# Patient Record
Sex: Male | Born: 1935 | Race: Black or African American | Hispanic: No | Smoking: Former smoker
Health system: Southern US, Community
[De-identification: ages and names within clinical notes are randomized; demographics above are authoritative.]

## PROBLEM LIST (undated history)

## (undated) DIAGNOSIS — I739 Peripheral vascular disease, unspecified: Secondary | ICD-10-CM

## (undated) DIAGNOSIS — I779 Disorder of arteries and arterioles, unspecified: Secondary | ICD-10-CM

## (undated) DIAGNOSIS — E782 Mixed hyperlipidemia: Secondary | ICD-10-CM

## (undated) DIAGNOSIS — E538 Deficiency of other specified B group vitamins: Secondary | ICD-10-CM

## (undated) DIAGNOSIS — D649 Anemia, unspecified: Secondary | ICD-10-CM

## (undated) DIAGNOSIS — K851 Biliary acute pancreatitis without necrosis or infection: Secondary | ICD-10-CM

## (undated) DIAGNOSIS — I4891 Unspecified atrial fibrillation: Secondary | ICD-10-CM

## (undated) DIAGNOSIS — I2510 Atherosclerotic heart disease of native coronary artery without angina pectoris: Secondary | ICD-10-CM

## (undated) DIAGNOSIS — M179 Osteoarthritis of knee, unspecified: Secondary | ICD-10-CM

## (undated) DIAGNOSIS — F0390 Unspecified dementia without behavioral disturbance: Secondary | ICD-10-CM

## (undated) DIAGNOSIS — M1710 Unilateral primary osteoarthritis, unspecified knee: Secondary | ICD-10-CM

## (undated) DIAGNOSIS — E119 Type 2 diabetes mellitus without complications: Secondary | ICD-10-CM

## (undated) DIAGNOSIS — I639 Cerebral infarction, unspecified: Secondary | ICD-10-CM

## (undated) DIAGNOSIS — I10 Essential (primary) hypertension: Secondary | ICD-10-CM

## (undated) DIAGNOSIS — G459 Transient cerebral ischemic attack, unspecified: Secondary | ICD-10-CM

## (undated) HISTORY — DX: Essential (primary) hypertension: I10

## (undated) HISTORY — DX: Atherosclerotic heart disease of native coronary artery without angina pectoris: I25.10

## (undated) HISTORY — DX: Unspecified atrial fibrillation: I48.91

## (undated) HISTORY — DX: Biliary acute pancreatitis without necrosis or infection: K85.10

## (undated) HISTORY — PX: HEMORRHOID SURGERY: SHX153

## (undated) HISTORY — DX: Mixed hyperlipidemia: E78.2

## (undated) HISTORY — DX: Anemia, unspecified: D64.9

## (undated) HISTORY — DX: Type 2 diabetes mellitus without complications: E11.9

## (undated) HISTORY — DX: Unspecified dementia, unspecified severity, without behavioral disturbance, psychotic disturbance, mood disturbance, and anxiety: F03.90

## (undated) HISTORY — DX: Peripheral vascular disease, unspecified: I73.9

## (undated) HISTORY — DX: Transient cerebral ischemic attack, unspecified: G45.9

## (undated) HISTORY — DX: Deficiency of other specified B group vitamins: E53.8

## (undated) HISTORY — PX: OTHER SURGICAL HISTORY: SHX169

## (undated) HISTORY — DX: Cerebral infarction, unspecified: I63.9

## (undated) HISTORY — DX: Osteoarthritis of knee, unspecified: M17.9

## (undated) HISTORY — DX: Unilateral primary osteoarthritis, unspecified knee: M17.10

## (undated) HISTORY — DX: Disorder of arteries and arterioles, unspecified: I77.9

## (undated) HISTORY — PX: CHOLECYSTECTOMY: SHX55

---

## 2002-11-03 ENCOUNTER — Emergency Department (HOSPITAL_COMMUNITY): Admission: EM | Admit: 2002-11-03 | Discharge: 2002-11-03 | Payer: Self-pay | Admitting: Emergency Medicine

## 2002-11-03 ENCOUNTER — Encounter: Payer: Self-pay | Admitting: Emergency Medicine

## 2002-11-17 ENCOUNTER — Encounter (HOSPITAL_COMMUNITY): Admission: RE | Admit: 2002-11-17 | Discharge: 2002-12-17 | Payer: Self-pay | Admitting: Family Medicine

## 2003-07-02 ENCOUNTER — Ambulatory Visit (HOSPITAL_COMMUNITY): Admission: RE | Admit: 2003-07-02 | Discharge: 2003-07-02 | Payer: Self-pay | Admitting: Family Medicine

## 2003-07-14 ENCOUNTER — Ambulatory Visit (HOSPITAL_COMMUNITY): Admission: RE | Admit: 2003-07-14 | Discharge: 2003-07-14 | Payer: Self-pay | Admitting: General Surgery

## 2003-08-25 ENCOUNTER — Ambulatory Visit (HOSPITAL_COMMUNITY): Admission: RE | Admit: 2003-08-25 | Discharge: 2003-08-25 | Payer: Self-pay | Admitting: Internal Medicine

## 2003-09-02 ENCOUNTER — Ambulatory Visit (HOSPITAL_COMMUNITY): Admission: RE | Admit: 2003-09-02 | Discharge: 2003-09-02 | Payer: Self-pay | Admitting: Internal Medicine

## 2003-09-08 ENCOUNTER — Inpatient Hospital Stay (HOSPITAL_COMMUNITY): Admission: EM | Admit: 2003-09-08 | Discharge: 2003-09-17 | Payer: Self-pay | Admitting: Emergency Medicine

## 2003-09-24 ENCOUNTER — Ambulatory Visit (HOSPITAL_COMMUNITY): Admission: RE | Admit: 2003-09-24 | Discharge: 2003-09-24 | Payer: Self-pay | Admitting: Internal Medicine

## 2003-09-28 ENCOUNTER — Ambulatory Visit (HOSPITAL_COMMUNITY): Admission: RE | Admit: 2003-09-28 | Discharge: 2003-09-28 | Payer: Self-pay | Admitting: Internal Medicine

## 2003-10-08 ENCOUNTER — Ambulatory Visit (HOSPITAL_COMMUNITY): Admission: RE | Admit: 2003-10-08 | Discharge: 2003-10-08 | Payer: Self-pay | Admitting: Internal Medicine

## 2003-11-05 ENCOUNTER — Ambulatory Visit (HOSPITAL_COMMUNITY): Admission: RE | Admit: 2003-11-05 | Discharge: 2003-11-05 | Payer: Self-pay | Admitting: Urology

## 2003-11-06 ENCOUNTER — Emergency Department (HOSPITAL_COMMUNITY): Admission: EM | Admit: 2003-11-06 | Discharge: 2003-11-06 | Payer: Self-pay | Admitting: Emergency Medicine

## 2003-11-24 ENCOUNTER — Ambulatory Visit (HOSPITAL_COMMUNITY): Admission: RE | Admit: 2003-11-24 | Discharge: 2003-11-24 | Payer: Self-pay | Admitting: *Deleted

## 2003-12-13 ENCOUNTER — Emergency Department (HOSPITAL_COMMUNITY): Admission: EM | Admit: 2003-12-13 | Discharge: 2003-12-13 | Payer: Self-pay | Admitting: Emergency Medicine

## 2003-12-16 ENCOUNTER — Encounter: Admission: RE | Admit: 2003-12-16 | Discharge: 2003-12-16 | Payer: Self-pay | Admitting: Orthopedic Surgery

## 2003-12-30 ENCOUNTER — Encounter: Admission: RE | Admit: 2003-12-30 | Discharge: 2003-12-30 | Payer: Self-pay | Admitting: Orthopedic Surgery

## 2004-01-20 ENCOUNTER — Encounter: Admission: RE | Admit: 2004-01-20 | Discharge: 2004-01-20 | Payer: Self-pay | Admitting: Orthopedic Surgery

## 2004-10-20 ENCOUNTER — Ambulatory Visit (HOSPITAL_COMMUNITY): Admission: RE | Admit: 2004-10-20 | Discharge: 2004-10-20 | Payer: Self-pay | Admitting: General Surgery

## 2004-11-03 ENCOUNTER — Ambulatory Visit (HOSPITAL_COMMUNITY): Admission: RE | Admit: 2004-11-03 | Discharge: 2004-11-03 | Payer: Self-pay | Admitting: General Surgery

## 2004-11-03 ENCOUNTER — Encounter (INDEPENDENT_AMBULATORY_CARE_PROVIDER_SITE_OTHER): Payer: Self-pay | Admitting: General Surgery

## 2004-11-03 LAB — HM COLONOSCOPY: HM Colonoscopy: NORMAL

## 2005-11-19 ENCOUNTER — Inpatient Hospital Stay (HOSPITAL_COMMUNITY): Admission: EM | Admit: 2005-11-19 | Discharge: 2005-11-21 | Payer: Self-pay | Admitting: Emergency Medicine

## 2005-11-27 ENCOUNTER — Ambulatory Visit: Payer: Self-pay | Admitting: Family Medicine

## 2005-12-12 ENCOUNTER — Ambulatory Visit: Payer: Self-pay | Admitting: Family Medicine

## 2005-12-26 ENCOUNTER — Ambulatory Visit: Payer: Self-pay | Admitting: Cardiology

## 2006-01-01 ENCOUNTER — Ambulatory Visit (HOSPITAL_COMMUNITY): Admission: RE | Admit: 2006-01-01 | Discharge: 2006-01-01 | Payer: Self-pay | Admitting: Cardiology

## 2006-01-01 ENCOUNTER — Ambulatory Visit: Payer: Self-pay | Admitting: Cardiology

## 2006-01-09 ENCOUNTER — Ambulatory Visit: Payer: Self-pay | Admitting: Family Medicine

## 2006-02-14 ENCOUNTER — Ambulatory Visit: Payer: Self-pay | Admitting: Family Medicine

## 2006-03-29 ENCOUNTER — Ambulatory Visit: Payer: Self-pay | Admitting: Family Medicine

## 2006-04-17 DIAGNOSIS — I639 Cerebral infarction, unspecified: Secondary | ICD-10-CM

## 2006-04-17 HISTORY — DX: Cerebral infarction, unspecified: I63.9

## 2006-05-28 ENCOUNTER — Ambulatory Visit: Payer: Self-pay | Admitting: Family Medicine

## 2006-05-29 ENCOUNTER — Encounter: Payer: Self-pay | Admitting: Family Medicine

## 2006-05-29 LAB — CONVERTED CEMR LAB
Alkaline Phosphatase: 60 units/L (ref 39–117)
Bilirubin, Direct: 0.1 mg/dL (ref 0.0–0.3)
CO2: 26 meq/L (ref 19–32)
Calcium: 9.6 mg/dL (ref 8.4–10.5)
Chloride: 103 meq/L (ref 96–112)
Cholesterol: 212 mg/dL — ABNORMAL HIGH (ref 0–200)
HDL: 40 mg/dL (ref >39)
Hemoglobin, Urine: NEGATIVE
Ketones, ur: NEGATIVE mg/dL
LDL Cholesterol: 157 mg/dL — ABNORMAL HIGH (ref 0–99)
Microalb, Ur: 1.45 mg/dL (ref 0.00–1.89)
Nitrite: NEGATIVE
Potassium: 4.7 meq/L (ref 3.5–5.3)
Protein, ur: NEGATIVE mg/dL
Specific Gravity, Urine: 1.011 (ref 1.005–1.03)
Triglycerides: 76 mg/dL (ref <150)
Urine Glucose: 100 mg/dL — AB
Urobilinogen, UA: 0.2 (ref 0.0–1.0)

## 2006-06-12 ENCOUNTER — Ambulatory Visit (HOSPITAL_COMMUNITY): Admission: RE | Admit: 2006-06-12 | Discharge: 2006-06-12 | Payer: Self-pay | Admitting: Family Medicine

## 2006-06-13 ENCOUNTER — Ambulatory Visit: Payer: Self-pay | Admitting: Family Medicine

## 2006-06-13 ENCOUNTER — Ambulatory Visit (HOSPITAL_COMMUNITY): Admission: RE | Admit: 2006-06-13 | Discharge: 2006-06-13 | Payer: Self-pay | Admitting: Family Medicine

## 2006-07-05 ENCOUNTER — Ambulatory Visit: Payer: Self-pay | Admitting: Family Medicine

## 2006-07-09 ENCOUNTER — Ambulatory Visit (HOSPITAL_COMMUNITY): Admission: RE | Admit: 2006-07-09 | Discharge: 2006-07-09 | Payer: Self-pay | Admitting: Family Medicine

## 2006-08-13 ENCOUNTER — Ambulatory Visit: Payer: Self-pay | Admitting: Family Medicine

## 2006-08-16 ENCOUNTER — Encounter (INDEPENDENT_AMBULATORY_CARE_PROVIDER_SITE_OTHER): Payer: Self-pay | Admitting: *Deleted

## 2006-08-16 LAB — CONVERTED CEMR LAB: PSA: 1.46 ng/mL

## 2006-08-24 ENCOUNTER — Encounter: Payer: Self-pay | Admitting: Family Medicine

## 2006-08-24 LAB — CONVERTED CEMR LAB
Alkaline Phosphatase: 49 units/L (ref 39–117)
Basophils Absolute: 0 10*3/uL (ref 0.0–0.1)
Basophils Relative: 0 % (ref 0–1)
Bilirubin, Direct: 0.1 mg/dL (ref 0.0–0.3)
CO2: 31 meq/L (ref 19–32)
Creatinine, Ser: 1.02 mg/dL (ref 0.40–1.50)
Eosinophils Absolute: 0.1 10*3/uL (ref 0.0–0.7)
Eosinophils Relative: 1 % (ref 0–5)
Glucose, Bld: 163 mg/dL — ABNORMAL HIGH (ref 70–99)
HDL: 40 mg/dL (ref >39)
Hgb A1c MFr Bld: 8 % — ABNORMAL HIGH (ref 4.6–6.1)
LDL Cholesterol: 84 mg/dL (ref 0–99)
Lymphocytes Relative: 49 % — ABNORMAL HIGH (ref 12–46)
MCHC: 34.3 g/dL (ref 30.0–36.0)
MCV: 79.4 fL (ref 78.0–100.0)
Monocytes Absolute: 0.6 10*3/uL (ref 0.2–0.7)
Monocytes Relative: 7 % (ref 3–11)
Neutro Abs: 3.7 10*3/uL (ref 1.7–7.7)
Neutrophils Relative %: 43 % (ref 43–77)
Platelets: 175 10*3/uL (ref 150–400)
Total CHOL/HDL Ratio: 3.5
Total Protein: 7.1 g/dL (ref 6.0–8.3)
Triglycerides: 85 mg/dL (ref <150)
VLDL: 17 mg/dL (ref 0–40)

## 2006-09-05 ENCOUNTER — Ambulatory Visit: Payer: Self-pay | Admitting: Vascular Surgery

## 2006-09-12 ENCOUNTER — Ambulatory Visit: Payer: Self-pay | Admitting: Family Medicine

## 2006-12-05 ENCOUNTER — Ambulatory Visit: Payer: Self-pay | Admitting: Family Medicine

## 2006-12-10 ENCOUNTER — Ambulatory Visit: Payer: Self-pay | Admitting: Family Medicine

## 2007-01-02 ENCOUNTER — Ambulatory Visit: Payer: Self-pay | Admitting: Orthopedic Surgery

## 2007-01-04 ENCOUNTER — Ambulatory Visit: Payer: Self-pay | Admitting: Family Medicine

## 2007-01-08 ENCOUNTER — Ambulatory Visit (HOSPITAL_COMMUNITY): Admission: RE | Admit: 2007-01-08 | Discharge: 2007-01-08 | Payer: Self-pay | Admitting: Orthopedic Surgery

## 2007-01-18 ENCOUNTER — Ambulatory Visit: Payer: Self-pay | Admitting: Family Medicine

## 2007-03-12 ENCOUNTER — Ambulatory Visit: Payer: Self-pay | Admitting: Family Medicine

## 2007-03-25 ENCOUNTER — Emergency Department (HOSPITAL_COMMUNITY): Admission: EM | Admit: 2007-03-25 | Discharge: 2007-03-25 | Payer: Self-pay | Admitting: Emergency Medicine

## 2007-04-18 ENCOUNTER — Encounter: Payer: Self-pay | Admitting: Family Medicine

## 2007-06-24 ENCOUNTER — Ambulatory Visit: Payer: Self-pay | Admitting: Family Medicine

## 2007-09-05 ENCOUNTER — Ambulatory Visit: Payer: Self-pay | Admitting: Family Medicine

## 2007-09-11 ENCOUNTER — Encounter (INDEPENDENT_AMBULATORY_CARE_PROVIDER_SITE_OTHER): Payer: Self-pay | Admitting: *Deleted

## 2007-09-11 DIAGNOSIS — E785 Hyperlipidemia, unspecified: Secondary | ICD-10-CM

## 2007-09-11 DIAGNOSIS — I10 Essential (primary) hypertension: Secondary | ICD-10-CM

## 2007-09-11 DIAGNOSIS — M1990 Unspecified osteoarthritis, unspecified site: Secondary | ICD-10-CM | POA: Insufficient documentation

## 2007-09-11 DIAGNOSIS — I2510 Atherosclerotic heart disease of native coronary artery without angina pectoris: Secondary | ICD-10-CM

## 2007-09-11 DIAGNOSIS — M1710 Unilateral primary osteoarthritis, unspecified knee: Secondary | ICD-10-CM

## 2007-10-14 ENCOUNTER — Ambulatory Visit: Payer: Self-pay | Admitting: Family Medicine

## 2007-10-17 ENCOUNTER — Encounter: Payer: Self-pay | Admitting: Family Medicine

## 2007-10-17 LAB — CONVERTED CEMR LAB
AST: 22 units/L (ref 0–37)
Alkaline Phosphatase: 44 units/L (ref 39–117)
Chloride: 107 meq/L (ref 96–112)
Cholesterol: 137 mg/dL (ref 0–200)
Creatinine, Ser: 1.07 mg/dL (ref 0.40–1.50)
Glucose, Bld: 109 mg/dL — ABNORMAL HIGH (ref 70–99)
HCT: 29.7 % — ABNORMAL LOW (ref 39.0–52.0)
HDL: 41 mg/dL (ref >39)
Hgb A1c MFr Bld: 7.8 % — ABNORMAL HIGH (ref 4.6–6.1)
LDL Cholesterol: 69 mg/dL (ref 0–99)
Lymphs Abs: 4.4 10*3/uL — ABNORMAL HIGH (ref 0.7–4.0)
MCHC: 33.3 g/dL (ref 30.0–36.0)
MCV: 77.7 fL — ABNORMAL LOW (ref 78.0–100.0)
Neutro Abs: 3.4 10*3/uL (ref 1.7–7.7)
Neutrophils Relative %: 40 % — ABNORMAL LOW (ref 43–77)
Platelets: 195 10*3/uL (ref 150–400)
Potassium: 4.3 meq/L (ref 3.5–5.3)
Total Bilirubin: 0.3 mg/dL (ref 0.3–1.2)
Triglycerides: 136 mg/dL (ref <150)

## 2007-10-21 ENCOUNTER — Encounter: Payer: Self-pay | Admitting: Family Medicine

## 2007-10-21 LAB — CONVERTED CEMR LAB

## 2007-10-22 ENCOUNTER — Ambulatory Visit (HOSPITAL_COMMUNITY): Admission: RE | Admit: 2007-10-22 | Discharge: 2007-10-22 | Payer: Self-pay | Admitting: Family Medicine

## 2007-12-02 ENCOUNTER — Encounter: Payer: Self-pay | Admitting: Family Medicine

## 2007-12-09 ENCOUNTER — Ambulatory Visit: Payer: Self-pay | Admitting: Family Medicine

## 2007-12-09 DIAGNOSIS — E538 Deficiency of other specified B group vitamins: Secondary | ICD-10-CM

## 2007-12-10 ENCOUNTER — Encounter: Payer: Self-pay | Admitting: Family Medicine

## 2007-12-11 ENCOUNTER — Telehealth: Payer: Self-pay | Admitting: Family Medicine

## 2007-12-11 DIAGNOSIS — E1121 Type 2 diabetes mellitus with diabetic nephropathy: Secondary | ICD-10-CM

## 2007-12-11 DIAGNOSIS — D509 Iron deficiency anemia, unspecified: Secondary | ICD-10-CM

## 2007-12-11 DIAGNOSIS — I6529 Occlusion and stenosis of unspecified carotid artery: Secondary | ICD-10-CM

## 2007-12-12 ENCOUNTER — Encounter: Payer: Self-pay | Admitting: Family Medicine

## 2007-12-24 ENCOUNTER — Ambulatory Visit: Payer: Self-pay | Admitting: Vascular Surgery

## 2007-12-24 ENCOUNTER — Encounter: Payer: Self-pay | Admitting: Family Medicine

## 2007-12-27 ENCOUNTER — Encounter: Payer: Self-pay | Admitting: Family Medicine

## 2007-12-27 ENCOUNTER — Ambulatory Visit: Payer: Self-pay | Admitting: Internal Medicine

## 2008-01-13 ENCOUNTER — Ambulatory Visit: Payer: Self-pay | Admitting: Family Medicine

## 2008-01-15 ENCOUNTER — Encounter: Payer: Self-pay | Admitting: Family Medicine

## 2008-01-20 ENCOUNTER — Ambulatory Visit: Payer: Self-pay | Admitting: Internal Medicine

## 2008-01-20 ENCOUNTER — Ambulatory Visit (HOSPITAL_COMMUNITY): Admission: RE | Admit: 2008-01-20 | Discharge: 2008-01-20 | Payer: Self-pay | Admitting: Internal Medicine

## 2008-01-20 ENCOUNTER — Encounter: Payer: Self-pay | Admitting: Internal Medicine

## 2008-01-20 ENCOUNTER — Encounter: Payer: Self-pay | Admitting: Family Medicine

## 2008-01-20 HISTORY — PX: ESOPHAGOGASTRODUODENOSCOPY: SHX1529

## 2008-01-20 HISTORY — PX: COLONOSCOPY: SHX174

## 2008-01-22 ENCOUNTER — Encounter: Payer: Self-pay | Admitting: Family Medicine

## 2008-01-28 ENCOUNTER — Telehealth (INDEPENDENT_AMBULATORY_CARE_PROVIDER_SITE_OTHER): Payer: Self-pay | Admitting: *Deleted

## 2008-01-28 ENCOUNTER — Ambulatory Visit: Payer: Self-pay | Admitting: Family Medicine

## 2008-01-28 LAB — CONVERTED CEMR LAB
Glucose, Bld: 272 mg/dL
Retic Ct Pct: 0.5 % (ref 0.4–3.1)

## 2008-02-10 ENCOUNTER — Telehealth: Payer: Self-pay | Admitting: Family Medicine

## 2008-02-11 ENCOUNTER — Telehealth: Payer: Self-pay | Admitting: Family Medicine

## 2008-02-12 ENCOUNTER — Telehealth: Payer: Self-pay | Admitting: Family Medicine

## 2008-02-18 ENCOUNTER — Telehealth: Payer: Self-pay | Admitting: Family Medicine

## 2008-02-19 ENCOUNTER — Telehealth: Payer: Self-pay | Admitting: Family Medicine

## 2008-02-20 ENCOUNTER — Encounter: Payer: Self-pay | Admitting: Family Medicine

## 2008-02-21 ENCOUNTER — Encounter: Payer: Self-pay | Admitting: Family Medicine

## 2008-03-10 ENCOUNTER — Ambulatory Visit: Payer: Self-pay | Admitting: Family Medicine

## 2008-03-10 LAB — CONVERTED CEMR LAB
Glucose, Bld: 433 mg/dL
Hgb A1c MFr Bld: 9.5 %

## 2008-03-16 ENCOUNTER — Telehealth: Payer: Self-pay | Admitting: Family Medicine

## 2008-04-15 ENCOUNTER — Ambulatory Visit: Payer: Self-pay | Admitting: Family Medicine

## 2008-04-15 DIAGNOSIS — R269 Unspecified abnormalities of gait and mobility: Secondary | ICD-10-CM

## 2008-04-15 DIAGNOSIS — R5381 Other malaise: Secondary | ICD-10-CM

## 2008-04-15 DIAGNOSIS — R5383 Other fatigue: Secondary | ICD-10-CM

## 2008-04-20 ENCOUNTER — Ambulatory Visit (HOSPITAL_COMMUNITY): Admission: RE | Admit: 2008-04-20 | Discharge: 2008-04-20 | Payer: Self-pay | Admitting: Family Medicine

## 2008-04-20 LAB — CONVERTED CEMR LAB: Retic Ct Pct: 0.6 % (ref 0.4–3.1)

## 2008-04-23 ENCOUNTER — Encounter: Payer: Self-pay | Admitting: Family Medicine

## 2008-04-23 ENCOUNTER — Ambulatory Visit: Payer: Self-pay | Admitting: Vascular Surgery

## 2008-04-23 ENCOUNTER — Encounter: Admission: RE | Admit: 2008-04-23 | Discharge: 2008-04-23 | Payer: Self-pay | Admitting: Orthopedic Surgery

## 2008-05-18 ENCOUNTER — Encounter: Payer: Self-pay | Admitting: Family Medicine

## 2008-06-17 ENCOUNTER — Encounter: Payer: Self-pay | Admitting: Family Medicine

## 2008-06-18 ENCOUNTER — Encounter: Payer: Self-pay | Admitting: Family Medicine

## 2008-06-24 ENCOUNTER — Encounter: Payer: Self-pay | Admitting: Family Medicine

## 2008-07-07 ENCOUNTER — Encounter: Payer: Self-pay | Admitting: Family Medicine

## 2008-07-08 ENCOUNTER — Encounter: Payer: Self-pay | Admitting: Family Medicine

## 2008-07-15 ENCOUNTER — Encounter: Payer: Self-pay | Admitting: Family Medicine

## 2008-07-16 ENCOUNTER — Ambulatory Visit: Payer: Self-pay | Admitting: Family Medicine

## 2008-07-16 LAB — CONVERTED CEMR LAB
Glucose, Bld: 169 mg/dL
Hgb A1c MFr Bld: 5.9 %

## 2008-07-27 ENCOUNTER — Telehealth: Payer: Self-pay | Admitting: Family Medicine

## 2008-08-17 ENCOUNTER — Encounter: Payer: Self-pay | Admitting: Family Medicine

## 2008-09-03 ENCOUNTER — Telehealth: Payer: Self-pay | Admitting: Family Medicine

## 2008-09-09 ENCOUNTER — Telehealth: Payer: Self-pay | Admitting: Family Medicine

## 2008-09-15 ENCOUNTER — Encounter: Payer: Self-pay | Admitting: Family Medicine

## 2008-10-12 ENCOUNTER — Telehealth: Payer: Self-pay | Admitting: Family Medicine

## 2008-10-12 ENCOUNTER — Ambulatory Visit: Payer: Self-pay | Admitting: Family Medicine

## 2008-10-12 LAB — CONVERTED CEMR LAB
Blood Glucose, Fasting: 80 mg/dL
Hgb A1c MFr Bld: 6.5 %

## 2008-10-13 ENCOUNTER — Encounter: Payer: Self-pay | Admitting: Family Medicine

## 2008-10-29 ENCOUNTER — Ambulatory Visit: Payer: Self-pay | Admitting: Family Medicine

## 2008-10-29 DIAGNOSIS — R259 Unspecified abnormal involuntary movements: Secondary | ICD-10-CM | POA: Insufficient documentation

## 2008-10-29 LAB — CONVERTED CEMR LAB: Hemoglobin: 9 g/dL

## 2008-10-30 ENCOUNTER — Telehealth: Payer: Self-pay | Admitting: Family Medicine

## 2008-11-02 ENCOUNTER — Encounter: Payer: Self-pay | Admitting: Family Medicine

## 2008-11-02 ENCOUNTER — Telehealth: Payer: Self-pay | Admitting: Family Medicine

## 2008-11-04 ENCOUNTER — Encounter: Payer: Self-pay | Admitting: Family Medicine

## 2008-11-05 ENCOUNTER — Encounter: Payer: Self-pay | Admitting: Family Medicine

## 2008-11-05 LAB — CONVERTED CEMR LAB
Albumin: 3.4 g/dL — ABNORMAL LOW (ref 3.5–5.2)
Alkaline Phosphatase: 43 units/L (ref 39–117)
Chloride: 109 meq/L (ref 96–112)
Creatinine, Ser: 1.26 mg/dL (ref 0.40–1.50)
HDL: 39 mg/dL — ABNORMAL LOW (ref >39)
LDL Cholesterol: 54 mg/dL (ref 0–99)
Lymphocytes Relative: 42 % (ref 12–46)
Lymphs Abs: 2.4 10*3/uL (ref 0.7–4.0)
Neutro Abs: 2.6 10*3/uL (ref 1.7–7.7)
Neutrophils Relative %: 46 % (ref 43–77)
Platelets: 154 10*3/uL (ref 150–400)
Potassium: 4.3 meq/L (ref 3.5–5.3)
RBC: 3.46 M/uL — ABNORMAL LOW (ref 4.22–5.81)
Sodium: 144 meq/L (ref 135–145)
TSH: 0.552 microintl units/mL (ref 0.350–4.500)
Total CHOL/HDL Ratio: 2.9
Total Protein: 6.6 g/dL (ref 6.0–8.3)
Triglycerides: 100 mg/dL (ref <150)
WBC: 5.8 10*3/uL (ref 4.0–10.5)

## 2008-11-06 LAB — CONVERTED CEMR LAB: Retic Ct Pct: 0.7 % (ref 0.4–3.1)

## 2008-11-12 ENCOUNTER — Ambulatory Visit: Payer: Self-pay | Admitting: Family Medicine

## 2008-11-18 ENCOUNTER — Encounter: Payer: Self-pay | Admitting: Family Medicine

## 2008-11-18 ENCOUNTER — Telehealth: Payer: Self-pay | Admitting: Family Medicine

## 2008-12-14 ENCOUNTER — Encounter: Payer: Self-pay | Admitting: Family Medicine

## 2008-12-15 ENCOUNTER — Ambulatory Visit: Payer: Self-pay | Admitting: Family Medicine

## 2008-12-17 ENCOUNTER — Telehealth: Payer: Self-pay | Admitting: Family Medicine

## 2008-12-18 ENCOUNTER — Ambulatory Visit: Payer: Self-pay | Admitting: Internal Medicine

## 2008-12-18 ENCOUNTER — Inpatient Hospital Stay (HOSPITAL_COMMUNITY): Admission: EM | Admit: 2008-12-18 | Discharge: 2008-12-24 | Payer: Self-pay | Admitting: Emergency Medicine

## 2008-12-18 ENCOUNTER — Encounter (INDEPENDENT_AMBULATORY_CARE_PROVIDER_SITE_OTHER): Payer: Self-pay | Admitting: Internal Medicine

## 2008-12-18 ENCOUNTER — Ambulatory Visit: Payer: Self-pay | Admitting: Vascular Surgery

## 2008-12-22 ENCOUNTER — Telehealth: Payer: Self-pay | Admitting: Family Medicine

## 2008-12-24 ENCOUNTER — Encounter: Payer: Self-pay | Admitting: Family Medicine

## 2008-12-28 ENCOUNTER — Telehealth: Payer: Self-pay | Admitting: Family Medicine

## 2008-12-28 ENCOUNTER — Encounter: Payer: Self-pay | Admitting: Family Medicine

## 2008-12-29 ENCOUNTER — Encounter: Payer: Self-pay | Admitting: Family Medicine

## 2008-12-31 ENCOUNTER — Ambulatory Visit: Payer: Self-pay | Admitting: Family Medicine

## 2008-12-31 ENCOUNTER — Telehealth: Payer: Self-pay | Admitting: Family Medicine

## 2009-01-07 ENCOUNTER — Ambulatory Visit: Payer: Self-pay | Admitting: Family Medicine

## 2009-01-07 ENCOUNTER — Telehealth: Payer: Self-pay | Admitting: Family Medicine

## 2009-01-08 ENCOUNTER — Encounter: Payer: Self-pay | Admitting: Family Medicine

## 2009-01-10 DIAGNOSIS — F0391 Unspecified dementia with behavioral disturbance: Secondary | ICD-10-CM

## 2009-01-13 ENCOUNTER — Telehealth: Payer: Self-pay | Admitting: Family Medicine

## 2009-01-13 ENCOUNTER — Ambulatory Visit: Payer: Self-pay | Admitting: Family Medicine

## 2009-01-21 ENCOUNTER — Ambulatory Visit: Payer: Self-pay | Admitting: Family Medicine

## 2009-02-04 ENCOUNTER — Ambulatory Visit: Payer: Self-pay | Admitting: Family Medicine

## 2009-02-15 ENCOUNTER — Ambulatory Visit: Payer: Self-pay | Admitting: Family Medicine

## 2009-02-26 ENCOUNTER — Telehealth: Payer: Self-pay | Admitting: Family Medicine

## 2009-04-07 ENCOUNTER — Ambulatory Visit: Payer: Self-pay | Admitting: Family Medicine

## 2009-04-07 LAB — CONVERTED CEMR LAB
Blood Glucose, Fasting: 144 mg/dL
Hgb A1c MFr Bld: 6.1 %

## 2009-07-08 ENCOUNTER — Ambulatory Visit: Payer: Self-pay | Admitting: Family Medicine

## 2009-07-29 ENCOUNTER — Ambulatory Visit: Payer: Self-pay | Admitting: Family Medicine

## 2009-07-30 ENCOUNTER — Encounter: Payer: Self-pay | Admitting: Family Medicine

## 2009-07-30 DIAGNOSIS — I499 Cardiac arrhythmia, unspecified: Secondary | ICD-10-CM | POA: Insufficient documentation

## 2009-07-30 LAB — CONVERTED CEMR LAB: Microalb, Ur: 5.91 mg/dL — ABNORMAL HIGH (ref 0.00–1.89)

## 2009-08-03 LAB — CONVERTED CEMR LAB: Retic Ct Pct: 0.8 % (ref 0.4–3.1)

## 2009-08-09 ENCOUNTER — Ambulatory Visit: Payer: Self-pay | Admitting: Family Medicine

## 2009-09-14 ENCOUNTER — Ambulatory Visit: Payer: Self-pay | Admitting: Family Medicine

## 2009-11-01 ENCOUNTER — Ambulatory Visit: Payer: Self-pay | Admitting: Family Medicine

## 2009-12-08 ENCOUNTER — Ambulatory Visit: Payer: Self-pay | Admitting: Family Medicine

## 2009-12-21 LAB — CONVERTED CEMR LAB
ALT: 11 units/L (ref 0–53)
AST: 13 units/L (ref 0–37)
BUN: 9 mg/dL (ref 6–23)
Bilirubin, Direct: 0.2 mg/dL (ref 0.0–0.3)
Calcium: 9.1 mg/dL (ref 8.4–10.5)
Cholesterol: 93 mg/dL (ref 0–200)
Glucose, Bld: 155 mg/dL — ABNORMAL HIGH (ref 70–99)
Indirect Bilirubin: 0.7 mg/dL (ref 0.0–0.9)
Sodium: 142 meq/L (ref 135–145)

## 2010-03-01 ENCOUNTER — Ambulatory Visit: Payer: Self-pay | Admitting: Family Medicine

## 2010-03-23 ENCOUNTER — Ambulatory Visit: Payer: Self-pay | Admitting: Family Medicine

## 2010-03-28 ENCOUNTER — Ambulatory Visit: Payer: Self-pay | Admitting: Family Medicine

## 2010-04-28 ENCOUNTER — Ambulatory Visit
Admission: RE | Admit: 2010-04-28 | Discharge: 2010-04-28 | Payer: Self-pay | Source: Home / Self Care | Attending: Family Medicine | Admitting: Family Medicine

## 2010-04-28 DIAGNOSIS — M899 Disorder of bone, unspecified: Secondary | ICD-10-CM | POA: Insufficient documentation

## 2010-04-28 DIAGNOSIS — M949 Disorder of cartilage, unspecified: Secondary | ICD-10-CM

## 2010-05-02 LAB — CONVERTED CEMR LAB
CO2: 26 meq/L (ref 19–32)
Chloride: 104 meq/L (ref 96–112)
Creatinine, Ser: 0.97 mg/dL (ref 0.40–1.50)
Potassium: 4.1 meq/L (ref 3.5–5.3)

## 2010-05-07 ENCOUNTER — Encounter: Payer: Self-pay | Admitting: Orthopedic Surgery

## 2010-05-17 NOTE — Letter (Signed)
Summary: xray  xray   Imported By: Rudene Anda 08/23/2009 16:10:23  _____________________________________________________________________  External Attachment:    Type:   Image     Comment:   External Document

## 2010-05-17 NOTE — Letter (Signed)
Summary: misc  misc   Imported By: Rudene Anda 08/23/2009 16:36:16  _____________________________________________________________________  External Attachment:    Type:   Image     Comment:   External Document

## 2010-05-17 NOTE — Assessment & Plan Note (Signed)
Summary: B1 2  INJECTION  Nurse Visit   Vital Signs:  Patient profile:   77 year old male Height:      69 inches Weight:      208 pounds Resp:     16 per minute BP sitting:   124 / 64  (left arm)  Vitals Entered By: Mauricia Area CMA (March 01, 2010 10:30 AM)  Allergies: No Known Drug Allergies  Medication Administration  Injection # 1:    Medication: Vit B12 1000 mcg    Diagnosis: B12 DEFICIENCY (ICD-266.2)    Route: IM    Site: R deltoid    Exp Date: 09/2010    Lot #: [id number]    Mfr: [employer]ent    Comments: 1000 micrograms given    Patient tolerated injection without complications    Given by: Mauricia Area CMA (March 01, 2010 10:32 AM)  Orders Added: 1)  Vit B12 1000 mcg [J3420] 2)  Admin of Therapeutic Inj  intramuscular or subcutaneous [96372] Received injection with no adverse effects

## 2010-05-17 NOTE — Letter (Signed)
Summary: office notes  office notes   Imported By: Rudene Anda 08/23/2009 15:41:41  _____________________________________________________________________  External Attachment:    Type:   Image     Comment:   External Document

## 2010-05-17 NOTE — Assessment & Plan Note (Signed)
Summary: INJ  Nurse Visit   Vital Signs:  Patient profile:   77 year old male Height:      69 inches Weight:      197 pounds BMI:     29.20 O2 Sat:      97 % Pulse rate:   84 / minute Resp:     16 per minute BP sitting:   138 / 72  (left arm) Cuff size:   regular  Vitals Entered By: Everitt Amber LPN (Sep 14, 2009 3:38 PM) CC: Patient came in to get monthly b12 injections   Allergies: No Known Drug Allergies  Medication Administration  Injection # 1:    Medication: Vit B12 1000 mcg    Diagnosis: B12 DEFICIENCY (ICD-266.2)    Route: IM    Site: R deltoid    Exp Date: 5/12    Lot #: [id number]    Mfr: [employer]ent    Comments: 1000 micrograms given     Patient tolerated injection without complications    Given by: Everitt Amber LPN (Sep 14, 2009 3:43 PM)  Orders Added: 1)  Vit B12 1000 mcg [J3420] 2)  Admin of Therapeutic Inj  intramuscular or subcutaneous [96372]   Medication Administration  Injection # 1:    Medication: Vit B12 1000 mcg    Diagnosis: B12 DEFICIENCY (ICD-266.2)    Route: IM    Site: R deltoid    Exp Date: 5/12    Lot #: [id number]    Mfr: [employer]ent    Comments: 1000 micrograms given     Patient tolerated injection without complications    Given by: Everitt Amber LPN (Sep 14, 2009 3:43 PM)  Orders Added: 1)  Vit B12 1000 mcg [J3420] 2)  Admin of Therapeutic Inj  intramuscular or subcutaneous [04540]

## 2010-05-17 NOTE — Letter (Signed)
Summary: phone notes  phone notes   Imported By: Rudene Anda 08/23/2009 16:37:10  _____________________________________________________________________  External Attachment:    Type:   Image     Comment:   External Document

## 2010-05-17 NOTE — Letter (Signed)
Summary: lab  lab   Imported By: Rudene Anda 08/23/2009 15:49:36  _____________________________________________________________________  External Attachment:    Type:   Image     Comment:   External Document

## 2010-05-17 NOTE — Letter (Signed)
Summary: conslutation  conslutation   Imported By: Rudene Anda 08/23/2009 16:21:05  _____________________________________________________________________  External Attachment:    Type:   Image     Comment:   External Document

## 2010-05-17 NOTE — Assessment & Plan Note (Signed)
Summary: MEDS   Vital Signs:  Patient profile:   77 year old male Height:      69 inches Weight:      206.75 pounds BMI:     30.64 O2 Sat:      97 % Pulse rate:   89 / minute Pulse rhythm:   regular Resp:     16 per minute BP sitting:   144 / 70  (left arm)  Vitals Entered By: Everitt Amber LPN (December 08, 2009 11:14 AM)  Nutrition Counseling: Patient's BMI is greater than 25 and therefore counseled on weight management options. CC: Follow up chronic problems, has been feeling good lately Comments didn't bring meds   CC:  Follow up chronic problems and has been feeling good lately.  History of Present Illness: Reports  that he has been  doing well. Denies recent fever or chills. Denies sinus pressure, nasal congestion , ear pain or sore throat. Denies chest congestion, or cough productive of sputum. Denies chest pain, palpitations, PND, orthopnea or leg swelling. Denies abdominal pain, nausea, vomitting, diarrhea or constipation. He unfortunately has not been testing his blood sugars, but styates that he is asymptomatic as far as symptoms suggesting uncontrol are concerned  Denies change in bowel movements or bloody stool. Denies dysuria , frequency, incontinence or hesitancy. He does have   joint pain, swelling, and  reduced mobility. Denies headaches, vertigo, seizures. Denies depression, anxiety or insomnia. Denies  rash, lesions, or itch.     Allergies: No Known Drug Allergies  Review of Systems      See HPI General:  Complains of fatigue. Eyes:  Denies blurring, eye pain, and red eye. Neuro:  Complains of memory loss. Endo:  Denies excessive thirst and excessive urination. Heme:  Denies abnormal bruising and bleeding. Allergy:  Denies hives or rash and itching eyes.  Physical Exam  General:  Well-developed,well-nourished,in no acute distress; alert,appropriate and cooperative throughout examination HEENT: No facial asymmetry,  EOMI, No sinus tenderness,  TM's Clear, oropharynx  pink and moist.   Chest: Clear to auscultation bilaterally.  CVS: S1, S2, No murmurs, No S3.   Abd: Soft, Nontender.  GH:[id number]  ROM spine, hips, shoulders and knees.  Ext: No edema.   CNS: CN 2-12 intact, power tone and sensation normal throughout.   Skin: Intact, no visible lesions or rashes.  Psych: Good eye contact, normal affect.  Memory intact, not anxious or depressed appearing.    Impression & Recommendations:  Problem # 1:  DIABETES MELLITUS, TYPE II (ICD-250.00) Assessment Deteriorated  The following medications were removed from the medication list:    Metformin Hcl 500 Mg Tabs (Metformin hcl) .Marland Kitchen... Take 1 tablet by mouth two times a day His updated medication list for this problem includes:    Aspirin 81 Mg Tbec (Aspirin) ..... One tab by mouth once daily    Lotensin 40 Mg Tabs (Benazepril hcl) .Marland Kitchen... Take 1 tablet by mouth once a day    Cozaar 50 Mg Tabs (Losartan potassium) .Marland Kitchen... Take 1 tablet by mouth once a day    Metformin Hcl 1000 Mg Tabs (Metformin hcl) .Marland Kitchen... Take 1 tablet by mouth two times a day  Orders: T- Hemoglobin A1C ([id number])  Labs Reviewed: Creat: 1.01 (07/29/2009)    Reviewed HgBA1c results: 7.3 (07/29/2009)  6.1 (04/07/2009)  Problem # 2:  HYPERTENSION (ICD-401.9) Assessment: Unchanged  His updated medication list for this problem includes:    Clonidine Hcl 0.2 Mg Tabs (Clonidine hcl) ..... One tab by  mouth at bedtime    Amlodipine Besylate 10 Mg Tabs (Amlodipine besylate) .Marland Kitchen... Take 1 tablet by mouth once a day    Lotensin 40 Mg Tabs (Benazepril hcl) .Marland Kitchen... Take 1 tablet by mouth once a day    Cozaar 50 Mg Tabs (Losartan potassium) .Marland Kitchen... Take 1 tablet by mouth once a day  Orders: T-Basic Metabolic Panel ([id number])  BP today: 144/70 Prior BP: 138/72 (09/14/2009)  Labs Reviewed: K+: 4.1 (07/29/2009) Creat: : 1.01 (07/29/2009)   Chol: 138 (04/07/2009)   HDL: 46 (04/07/2009)   LDL: 75 (04/07/2009)   TG: 84  (04/07/2009)  Problem # 3:  HYPERLIPIDEMIA (ICD-272.4) Assessment: Comment Only  The following medications were removed from the medication list:    Simvastatin 80 Mg Tabs (Simvastatin) ..... One tab by mouth at bedtime    Lovastatin 40 Mg Tabs (Lovastatin) .Marland Kitchen... 2 tablets at bedtime His updated medication list for this problem includes:    Lovastatin 40 Mg Tabs (Lovastatin) .Marland Kitchen... Take 1 tab by mouth at bedtime  Orders: T-Lipid Profile [id number]) T-Hepatic Function [phone])  Labs Reviewed: SGOT: 13 (07/29/2009)   SGPT: 12 (07/29/2009)   HDL:46 (04/07/2009), 39 (11/04/2008)  LDL:75 (04/07/2009), 54 (11/04/2008)  Chol:138 (04/07/2009), 113 (11/04/2008)  Trig:84 (04/07/2009), 100 (11/04/2008)  Complete Medication List: 1)  Aspirin 81 Mg Tbec (Aspirin) .... One tab by mouth once daily 2)  Clonidine Hcl 0.2 Mg Tabs (Clonidine hcl) .... One tab by mouth at bedtime 3)  Omeprazole 20 Mg Cpdr (Omeprazole) .... One cap by mouth qd 4)  Amlodipine Besylate 10 Mg Tabs (Amlodipine besylate) .... Take 1 tablet by mouth once a day 5)  Lotensin 40 Mg Tabs (Benazepril hcl) .... Take 1 tablet by mouth once a day 6)  Tylenol Arthritis Pain 650 Mg Cr-tabs (Acetaminophen) .... Take 1 tablet by mouth once a day as needed 7)  Aricept 10 Mg Tabs (Donepezil hcl) .... One tab by mouth qd 8)  Cozaar 50 Mg Tabs (Losartan potassium) .... Take 1 tablet by mouth once a day 9)  Ferrex 28 Tabs (Feaspgl-fefum-b12-fa-c-succ ac) .... Take 1 tablet by mouth once a day 10)  Metformin Hcl 1000 Mg Tabs (Metformin hcl) .... Take 1 tablet by mouth two times a day 11)  Lovastatin 40 Mg Tabs (Lovastatin) .... Take 1 tab by mouth at bedtime  Other Orders: T-PSA ([id number]) T-Vitamin D (25-Hydroxy) ([id number]) Vit B12 1000 mcg (J3420) Admin of Therapeutic Inj  intramuscular or subcutaneous (02725)  Patient Instructions: 1)  Please schedule a follow-up appointment in 4 months. 2)  BMP prior to visit, ICD-9: 3)   Hepatic Panel prior to visit, ICD-9:  fasting asap 4)  Lipid Panel prior to visit, ICD-9: 5)  HbgA1C prior to visit, ICD-9: 6)  pSA and Vit D  7)  pLS STOP simvastatin. 8)  New is lOVASTATIN  Prescriptions: LOVASTATIN 40 MG TABS (LOVASTATIN) Take 1 tab by mouth at bedtime  #30 x 3   Entered and Authorized by:   Syliva Overman MD   Signed by:   Syliva Overman MD on 12/13/2009   Method used:   Printed then faxed to ...       [facility] (retail)       [address]       [place], [place]  [address]       Ph: [phone]       Fax: [phone]   RxID:   [id number] METFORMIN HCL 1000 MG TABS (  METFORMIN HCL) Take 1 tablet by mouth two times a day  #60 x 3   Entered and Authorized by:   Syliva Overman MD   Signed by:   Syliva Overman MD on 12/13/2009   Method used:   Printed then faxed to ...       [facility] (retail)       [address]       [place], [place]  [address]       Ph: [phone]       Fax: [phone]   RxID:   [id number] LOVASTATIN 40 MG TABS (LOVASTATIN) 2 tablets at bedtime  #60 x 4   Entered and Authorized by:   Syliva Overman MD   Signed by:   Syliva Overman MD on 12/08/2009   Method used:   Printed then faxed to ...       [facility] (retail)       [address]       [place], [place]  [address]       Ph: [phone]       Fax: [phone]   RxID:   [id number]    Medication Administration  Injection # 1:    Medication: Vit B12 1000 mcg    Diagnosis: B12 DEFICIENCY (ICD-266.2)    Route: IM    Site: R deltoid    Exp Date: 5/12    Lot #: [id number]    Mfr: [employer] Regent    Patient tolerated injection without complications    Given by: Adella Hare LPN (December 08, 2009 12:11 PM)  Orders Added: 1)  Est. Patient Level IV [99214] 2)  T-Basic Metabolic Panel [phone] 3)  T-Lipid Profile [80061-22930] 4)   T-Hepatic Function [80076-22960] 5)  T- Hemoglobin A1C [83036-23375] 6)  T-PSA [82423-53614] 7)  T-Vitamin D (25-Hydroxy) [43154-00867] 8)  Vit B12 1000 mcg [J3420] 9)  Admin of Therapeutic Inj  intramuscular or subcutaneous [61950]

## 2010-05-17 NOTE — Assessment & Plan Note (Signed)
Summary: F UP   Vital Signs:  Patient profile:   77 year old male Height:      69 inches Weight:      206.50 pounds BMI:     30.60 O2 Sat:      98 % Pulse rate:   66 / minute Pulse rhythm:   irregular Resp:     16 per minute BP sitting:   150 / 80  (left arm) Cuff size:   large  Vitals Entered By: Everitt Amber LPN (July 29, 2009 10:53 AM)  Nutrition Counseling: Patient's BMI is greater than 25 and therefore counseled on weight management options. CC: Follow up chronic problems   CC:  Follow up chronic problems.  History of Present Illness: Reports  that he has been  doing well. Denies recent fever or chills. Denies sinus pressure, nasal congestion , ear pain or sore throat. Denies chest congestion, or cough productive of sputum. Denies chest pain, palpitations, PND, orthopnea or leg swelling. Denies abdominal pain, nausea, vomitting, diarrhea or constipation. Denies change in bowel movements or bloody stool. Denies dysuria , frequency, incontinence or hesitancy.  Denies headaches, vertigo, seizures. Denies depression, anxiety or insomnia. Denies  rash, lesions, or itch. He does not test his blood sugars regularly . and hedenies any symptoms of uncontrolled blood sugar. He is back to doing what he has in the past. He lives independently, and is involved in youth sports.     Allergies: No Known Drug Allergies  Review of Systems      See HPI Eyes:  Denies discharge, eye pain, and red eye. MS:  Complains of joint pain, low back pain, mid back pain, and stiffness. Endo:  Denies cold intolerance, excessive hunger, excessive thirst, excessive urination, heat intolerance, polyuria, and weight change. Allergy:  Denies hives or rash and itching eyes.  Physical Exam  General:  Well-developed,well-nourished,in no acute distress; alert,appropriate and cooperative throughout examination HEENT: No facial asymmetry,  EOMI, No sinus tenderness, TM's Clear, oropharynx  pink and  moist.   Chest: Clear to auscultation bilaterally.  CVS: S1, S2, No murmurs, No S3.   Abd: Soft, Nontender.  VH:QIONGEXBM  ROM spine, hips, shoulders and knees.  Ext: No edema.   CNS: CN 2-12 intact, power tone and sensation normal throughout.   Skin: Intact, no visible lesions or rashes.  Psych: Good eye contact, normal affect.  Memory intact, not anxious or depressed appearing.    Impression & Recommendations:  Problem # 1:  CARDIAC ARRHYTHMIA (ICD-427.9) Assessment Comment Only  His updated medication list for this problem includes:    Aspirin 81 Mg Tbec (Aspirin) ..... One tab by mouth once daily  Orders: EKG w/ Interpretation (93000), NSR no evidence of acute ischemia  Problem # 2:  SENILE DEMENTIA, UNCOMPLICATED (ICD-290.0) Assessment: Improved  Problem # 3:  DIABETES MELLITUS, TYPE II (ICD-250.00) Assessment: Deteriorated  The following medications were removed from the medication list:    Metformin Hcl 500 Mg Tabs (Metformin hcl) ..... One tab by mouth qd His updated medication list for this problem includes:    Aspirin 81 Mg Tbec (Aspirin) ..... One tab by mouth once daily    Lotensin 40 Mg Tabs (Benazepril hcl) .Marland Kitchen... Take 1 tablet by mouth once a day    Cozaar 50 Mg Tabs (Losartan potassium) .Marland Kitchen... Take 1 tablet by mouth once a day    Metformin Hcl 500 Mg Tabs (Metformin hcl) .Marland Kitchen... Take 1 tablet by mouth two times a day  Orders: T-  Hemoglobin A1C ([id number]) T-Urine Microalbumin w/creat. ratio [phone])  Labs Reviewed: Creat: 0.96 (04/07/2009)    Reviewed HgBA1c results: 6.1 (04/07/2009)  6.2 (12/31/2008)  Problem # 4:  B12 DEFICIENCY (ICD-266.2) Assessment: Comment Only b12 monthl;y to continue  Problem # 5:  HYPERTENSION (ICD-401.9) Assessment: Deteriorated  His updated medication list for this problem includes:    Clonidine Hcl 0.2 Mg Tabs (Clonidine hcl) ..... One tab by mouth at bedtime    Amlodipine Besylate 10 Mg Tabs (Amlodipine  besylate) .Marland Kitchen... Take 1 tablet by mouth once a day    Lotensin 40 Mg Tabs (Benazepril hcl) .Marland Kitchen... Take 1 tablet by mouth once a day    Cozaar 50 Mg Tabs (Losartan potassium) .Marland Kitchen... Take 1 tablet by mouth once a day  Orders: T-Basic Metabolic Panel [id number])  BP today: 150/80 Prior BP: 130/60 (07/08/2009)  Labs Reviewed: K+: 4.1 (04/07/2009) Creat: : 0.96 (04/07/2009)   Chol: 138 (04/07/2009)   HDL: 46 (04/07/2009)   LDL: 75 (04/07/2009)   TG: 84 (04/07/2009)  Problem # 6:  HYPERLIPIDEMIA (ICD-272.4) Assessment: Comment Only  His updated medication list for this problem includes:    Simvastatin 80 Mg Tabs (Simvastatin) ..... One tab by mouth at bedtime  Orders: T-Hepatic Function [phone])  Labs Reviewed: SGOT: 10 (04/07/2009)   SGPT: <8 U/L (04/07/2009)   HDL:46 (04/07/2009), 39 (11/04/2008)  LDL:75 (04/07/2009), 54 (11/04/2008)  Chol:138 (04/07/2009), 113 (11/04/2008)  Trig:84 (04/07/2009), 100 (11/04/2008)  Complete Medication List: 1)  Aspirin 81 Mg Tbec (Aspirin) .... One tab by mouth once daily 2)  Simvastatin 80 Mg Tabs (Simvastatin) .... One tab by mouth at bedtime 3)  Clonidine Hcl 0.2 Mg Tabs (Clonidine hcl) .... One tab by mouth at bedtime 4)  Omeprazole 20 Mg Cpdr (Omeprazole) .... One cap by mouth qd 5)  Amlodipine Besylate 10 Mg Tabs (Amlodipine besylate) .... Take 1 tablet by mouth once a day 6)  Lotensin 40 Mg Tabs (Benazepril hcl) .... Take 1 tablet by mouth once a day 7)  Tylenol Arthritis Pain 650 Mg Cr-tabs (Acetaminophen) .... Take 1 tablet by mouth once a day as needed 8)  Aricept 10 Mg Tabs (Donepezil hcl) .... One tab by mouth qd 9)  Cozaar 50 Mg Tabs (Losartan potassium) .... Take 1 tablet by mouth once a day 10)  Ferrex 28 Tabs (Feaspgl-fefum-b12-fa-c-succ ac) .... Take 1 tablet by mouth once a day 11)  Metformin Hcl 500 Mg Tabs (Metformin hcl) .... Take 1 tablet by mouth two times a day  Other Orders: T-CBC w/Diff [phone]) T-Vitamin D  (25-Hydroxy) [phone]) T- * Misc. Laboratory test [phone])  Patient Instructions: 1)  Please schedule a follow-up appointment in 3 months. 2)  It is important that you exercise regularly at least 20 minutes 5 times a week. If you develop chest pain, have severe difficulty breathing, or feel very tired , stop exercising immediately and seek medical attention. 3)  BMP prior to visit, ICD-9: 4)  Hepatic Panel prior to visit, ICD-9: 5)  CBC w/ Diff prior to visit, ICD-9: 6)  HbgA1C prior to visit, ICD-9:   today 7)  anemia panel, and vit D level Prescriptions: METFORMIN HCL 500 MG TABS (METFORMIN HCL) Take 1 tablet by mouth two times a day  #60 x 3   Entered and Authorized by:   Syliva Overman MD   Signed by:   Syliva Overman MD on 07/30/2009   Method used:   Electronically to        [facility] (  retail)       [address]       [place], [place]  [address]       Ph: [phone]       Fax: [phone]   RxID:   [id number]

## 2010-05-17 NOTE — Assessment & Plan Note (Signed)
Summary: B12 SHOT  Nurse Visit   Vital Signs:  Patient profile:   77 year old male Height:      69 inches Weight:      206.50 pounds BMI:     30.60 O2 Sat:      95 % Pulse rate:   97 / minute Resp:     16 per minute BP sitting:   130 / 60  (left arm) Cuff size:   large  Vitals Entered By: Everitt Amber LPN (July 08, 2009 3:52 PM) CC: Nurse visit for b12 shot   Allergies: No Known Drug Allergies  Medication Administration  Injection # 1:    Medication: Vit B12 1000 mcg    Diagnosis: B12 DEFICIENCY (ICD-266.2)    Route: IM    Site: R deltoid    Exp Date: 5/11    Lot #: [id number]    Mfr: American Regent    Comments: given     Patient tolerated injection without complications    Given by: Everitt Amber LPN (July 08, 2009 3:54 PM)  Orders Added: 1)  Vit B12 1000 mcg [J3420] 2)  Admin of Therapeutic Inj  intramuscular or subcutaneous [96372]     Medication Administration  Injection # 1:    Medication: Vit B12 1000 mcg    Diagnosis: B12 DEFICIENCY (ICD-266.2)    Route: IM    Site: R deltoid    Exp Date: 5/11    Lot #: [id number]    Mfr: [employer] Regent    Comments: given     Patient tolerated injection without complications    Given by: Everitt Amber LPN (July 08, 2009 3:54 PM)  Orders Added: 1)  Vit B12 1000 mcg [J3420] 2)  Admin of Therapeutic Inj  intramuscular or subcutaneous [54098]

## 2010-05-17 NOTE — Assessment & Plan Note (Signed)
Summary: B 12  Nurse Visit   Allergies: No Known Drug Allergies  Medication Administration  Injection # 1:    Medication: Vit B12 1000 mcg    Diagnosis: B12 DEFICIENCY (ICD-266.2)    Route: IM    Site: R deltoid    Exp Date: 8/12    Lot #: [id number]    Mfr: [employer] Regent    Patient tolerated injection without complications    Given by: Adella Hare LPN (November 01, 2009 10:49 AM)  Orders Added: 1)  Vit B12 1000 mcg [J3420] 2)  Admin of Therapeutic Inj  intramuscular or subcutaneous [96372]   Medication Administration  Injection # 1:    Medication: Vit B12 1000 mcg    Diagnosis: B12 DEFICIENCY (ICD-266.2)    Route: IM    Site: R deltoid    Exp Date: 8/12    Lot #: [id number]    Mfr: [employer] Regent    Patient tolerated injection without complications    Given by: Adella Hare LPN (November 01, 2009 10:49 AM)  Orders Added: 1)  Vit B12 1000 mcg [J3420] 2)  Admin of Therapeutic Inj  intramuscular or subcutaneous [82956]

## 2010-05-17 NOTE — Assessment & Plan Note (Signed)
Summary: B 12  Nurse Visit   Vital Signs:  Patient profile:   77 year old male Height:      69 inches Weight:      204 pounds BMI:     30.23 O2 Sat:      94 % Pulse rate:   94 / minute Resp:     16 per minute BP sitting:   134 / 80  (left arm) Cuff size:   regular  Vitals Entered By: Everitt Amber LPN (August 09, 2009 11:41 AM) Comments to get monthly b12 injection   Allergies: No Known Drug Allergies  Medication Administration  Injection # 1:    Medication: Vit B12 1000 mcg    Diagnosis: B12 DEFICIENCY (ICD-266.2)    Route: IM    Site: L deltoid    Exp Date: 5/11    Lot #: [id number]    Mfr: [employer] Regent    Comments: 1000 micrograms given     Patient tolerated injection without complications    Given by: Everitt Amber LPN (August 09, 2009 11:43 AM)  Orders Added: 1)  Vit B12 1000 mcg [J3420] 2)  Admin of Therapeutic Inj  intramuscular or subcutaneous [96372]   Medication Administration  Injection # 1:    Medication: Vit B12 1000 mcg    Diagnosis: B12 DEFICIENCY (ICD-266.2)    Route: IM    Site: L deltoid    Exp Date: 5/11    Lot #: [id number]    Mfr: [employer] Regent    Comments: 1000 micrograms given     Patient tolerated injection without complications    Given by: Everitt Amber LPN (August 09, 2009 11:43 AM)  Orders Added: 1)  Vit B12 1000 mcg [J3420] 2)  Admin of Therapeutic Inj  intramuscular or subcutaneous [96372] administered wit no complications

## 2010-05-17 NOTE — Letter (Signed)
Summary: demo  demo   Imported By: Rudene Anda 08/23/2009 15:34:47  _____________________________________________________________________  External Attachment:    Type:   Image     Comment:   External Document

## 2010-05-17 NOTE — Letter (Signed)
Summary: history and physical  history and physical   Imported By: Rudene Anda 08/23/2009 15:37:19  _____________________________________________________________________  External Attachment:    Type:   Image     Comment:   External Document

## 2010-05-19 NOTE — Assessment & Plan Note (Signed)
Summary: f up   Vital Signs:  Patient profile:   77 year old male Height:      69 inches Weight:      216 pounds BMI:     32.01 O2 Sat:      96 % Pulse rate:   96 / minute Pulse rhythm:   regular Resp:     16 per minute BP sitting:   150 / 72  (left arm) Cuff size:   large  Vitals Entered By: Everitt Amber LPN (April 28, 2010 10:38 AM)  Nutrition Counseling: Patient's BMI is greater than 25 and therefore counseled on weight management options. CC: Follow up chronic problems   CC:  Follow up chronic problems.  History of Present Illness: Reports  thathe has been  doing well. He has not been getting his monthl;y B12 injections. He has not been testing his blood sugars but is asympotomatic . Denies recent fever or chills. Denies sinus pressure, nasal congestion , ear pain or sore throat. Denies chest congestion, or cough productive of sputum. Denies chest pain, palpitations, PND, orthopnea or leg swelling. Denies abdominal pain, nausea, vomitting, diarrhea or constipation. Denies change in bowel movements or bloody stool. Denies dysuria , frequency, incontinence or hesitancy. Denies  joint pain, swelling, or reduced mobility. Denies headaches, vertigo, seizures. Denies depression, anxiety or insomnia. Denies  rash, lesions, or itch.     Allergies (verified): No Known Drug Allergies  Review of Systems      See HPI Eyes:  Complains of vision loss-both eyes; denies discharge and eye pain. MS:  Complains of joint pain, low back pain, mid back pain, and stiffness; mild. Neuro:  Complains of memory loss. Endo:  Denies excessive thirst and excessive urination. Heme:  Denies abnormal bruising and bleeding. Allergy:  Denies hives or rash and persistent infections.  Physical Exam  General:  Well-developed,well-nourished,in no acute distress; alert,appropriate and cooperative throughout examination HEENT: No facial asymmetry,  EOMI, No sinus tenderness, TM's Clear,  oropharynx  pink and moist.   Chest: Clear to auscultation bilaterally.  CVS: S1, S2, No murmurs, No S3.   Abd: Soft, Nontender.  MS: decreased  ROM spine,adequate in  hips, shoulders and knees.  Ext: No edema.   CNS: CN 2-12 intact, power tone and sensation normal throughout.   Skin: Intact, no visible lesions or rashes.  Psych: Good eye contact, normal affect.  Memory intact, not anxious or depressed appearing.    Impression & Recommendations:  Problem # 1:  DISORDER OF BONE AND CARTILAGE UNSPECIFIED (ICD-733.90) Assessment Comment Only  Orders: T-Vitamin D (25-Hydroxy) ([id number]), non compliant with med  Problem # 2:  SENILE DEMENTIA, UNCOMPLICATED (ICD-290.0) Assessment: Unchanged  Problem # 3:  DIABETES MELLITUS, TYPE II (ICD-250.00) Assessment: Deteriorated  His updated medication list for this problem includes:    Aspirin 81 Mg Tbec (Aspirin) ..... One tab by mouth once daily    Lotensin 40 Mg Tabs (Benazepril hcl) .Marland Kitchen... Take 1 tablet by mouth once a day    Cozaar 50 Mg Tabs (Losartan potassium) .Marland Kitchen... Take 1 tablet by mouth once a day    Metformin Hcl 1000 Mg Tabs (Metformin hcl) .Marland Kitchen... Take 1 tablet by mouth two times a day    Glipizide 5 Mg Xr24h-tab (Glipizide) ..... One tablet daily at breakfast  Orders: T- Hemoglobin A1C ([id number])  Problem # 4:  HYPERTENSION (ICD-401.9) Assessment: Deteriorated  His updated medication list for this problem includes:    Clonidine Hcl 0.2 Mg Tabs (Clonidine hcl) .Marland KitchenMarland KitchenMarland KitchenMarland Kitchen  One tab by mouth at bedtime    Amlodipine Besylate 10 Mg Tabs (Amlodipine besylate) .Marland Kitchen... Take 1 tablet by mouth once a day    Lotensin 40 Mg Tabs (Benazepril hcl) .Marland Kitchen... Take 1 tablet by mouth once a day    Cozaar 50 Mg Tabs (Losartan potassium) .Marland Kitchen... Take 1 tablet by mouth once a day compliance very doubtful, pt does not have his meds Orders: T-Basic Metabolic Panel ([id number])  BP today: 150/72 Prior BP: 124/64 (03/01/2010)  Labs Reviewed: K+: 3.8  (12/10/2009) Creat: : 0.90 (12/10/2009)   Chol: 93 (12/10/2009)   HDL: 30 (12/10/2009)   LDL: 47 (12/10/2009)   TG: 78 (12/10/2009)  Complete Medication List: 1)  Aspirin 81 Mg Tbec (Aspirin) .... One tab by mouth once daily 2)  Clonidine Hcl 0.2 Mg Tabs (Clonidine hcl) .... One tab by mouth at bedtime 3)  Omeprazole 20 Mg Cpdr (Omeprazole) .... One cap by mouth qd 4)  Amlodipine Besylate 10 Mg Tabs (Amlodipine besylate) .... Take 1 tablet by mouth once a day 5)  Lotensin 40 Mg Tabs (Benazepril hcl) .... Take 1 tablet by mouth once a day 6)  Tylenol Arthritis Pain 650 Mg Cr-tabs (Acetaminophen) .... Take 1 tablet by mouth once a day as needed 7)  Aricept 10 Mg Tabs (Donepezil hcl) .... One tab by mouth qd 8)  Cozaar 50 Mg Tabs (Losartan potassium) .... Take 1 tablet by mouth once a day 9)  Ferrex 28 Tabs (Feaspgl-fefum-b12-fa-c-succ ac) .... Take 1 tablet by mouth once a day 10)  Metformin Hcl 1000 Mg Tabs (Metformin hcl) .... Take 1 tablet by mouth two times a day 11)  Lovastatin 40 Mg Tabs (Lovastatin) .... Take 1 tab by mouth at bedtime 12)  Vitamin D (ergocalciferol) 50000 Unit Caps (Ergocalciferol) .... One cap by mouth every week 13)  Glipizide 5 Mg Xr24h-tab (Glipizide) .... One tablet daily at breakfast  Other Orders: Vit B12 1000 mcg (J3420) Admin of Therapeutic Inj  intramuscular or subcutaneous (09811)  Patient Instructions: 1)  Please schedule nurse visit for B12 injection in 4 weeks. then every 4 weeks 2)  mD visit in 8 weeks. 3)  It is important that you exercise regularly at least 20 minutes 5 times a week. If you develop chest pain, have severe difficulty breathing, or feel very tired , stop exercising immediately and seek medical attention. 4)  You need to lose weight. Consider a lower calorie diet and regular exercise.  5)  BMP prior to visit, ICD-9: 6)  HbgA1C prior to visit, ICD-9: 7)  Vit D 8)  Pls keep your sweets and carb intake down 9)  Check your blood sugars  regularly. If your readings are usually above 250 or below 70 you should contact our office. Prescriptions: GLIPIZIDE 5 MG XR24H-TAB (GLIPIZIDE) one tablet daily at breakfast  #30 x 5   Entered and Authorized by:   Syliva Overman MD   Signed by:   Syliva Overman MD on 05/01/2010   Method used:   Historical   RxID:   [id number]    Medication Administration  Injection # 1:    Medication: Vit B12 1000 mcg    Diagnosis: B12 DEFICIENCY (ICD-266.2)    Route: IM    Site: RUOQ gluteus    Exp Date: 09/2010    Lot #: [id number]    Mfr: [employer] Regent    Comments: given     Patient tolerated injection without complications    Given by: Everitt Amber LPN (  April 28, 2010 11:25 AM)  Orders Added: 1)  Est. Patient Level IV [99214] 2)  T-Basic Metabolic Panel [80048-22910] 3)  T- Hemoglobin A1C [83036-23375] 4)  T-Vitamin D (25-Hydroxy) [phone] 5)  Vit B12 1000 mcg [J3420] 6)  Admin of Therapeutic Inj  intramuscular or subcutaneous [96372]     Medication Administration  Injection # 1:    Medication: Vit B12 1000 mcg    Diagnosis: B12 DEFICIENCY (ICD-266.2)    Route: IM    Site: RUOQ gluteus    Exp Date: 09/2010    Lot #: [id number]    Mfr: [employer] Regent    Comments: given     Patient tolerated injection without complications    Given by: Everitt Amber LPN (April 28, 2010 11:25 AM)  Orders Added: 1)  Est. Patient Level IV [19147] 2)  T-Basic Metabolic Panel [80048-22910] 3)  T- Hemoglobin A1C [83036-23375] 4)  T-Vitamin D (25-Hydroxy) [82956-21308] 5)  Vit B12 1000 mcg [J3420] 6)  Admin of Therapeutic Inj  intramuscular or subcutaneous [65784]

## 2010-05-19 NOTE — Letter (Signed)
Summary: 1ST MISSED LETTER  1ST MISSED LETTER   Imported By: Lind Guest 03/29/2010 09:10:38  _____________________________________________________________________  External Attachment:    Type:   Image     Comment:   External Document

## 2010-05-26 ENCOUNTER — Ambulatory Visit: Payer: Self-pay

## 2010-06-13 ENCOUNTER — Ambulatory Visit (INDEPENDENT_AMBULATORY_CARE_PROVIDER_SITE_OTHER): Payer: Medicare Other

## 2010-06-13 ENCOUNTER — Encounter: Payer: Self-pay | Admitting: Family Medicine

## 2010-06-13 DIAGNOSIS — E538 Deficiency of other specified B group vitamins: Secondary | ICD-10-CM

## 2010-06-23 ENCOUNTER — Encounter: Payer: Self-pay | Admitting: Family Medicine

## 2010-06-23 ENCOUNTER — Ambulatory Visit (INDEPENDENT_AMBULATORY_CARE_PROVIDER_SITE_OTHER): Payer: Medicare Other | Admitting: Family Medicine

## 2010-06-23 DIAGNOSIS — E538 Deficiency of other specified B group vitamins: Secondary | ICD-10-CM

## 2010-06-23 DIAGNOSIS — E785 Hyperlipidemia, unspecified: Secondary | ICD-10-CM

## 2010-06-23 DIAGNOSIS — I10 Essential (primary) hypertension: Secondary | ICD-10-CM

## 2010-06-23 DIAGNOSIS — E119 Type 2 diabetes mellitus without complications: Secondary | ICD-10-CM

## 2010-06-23 NOTE — Assessment & Plan Note (Signed)
Summary: injection  Nurse Visit   Vital Signs:  Patient profile:   77 year old male Height:      69 inches Weight:      214 pounds  Vitals Entered By: Everitt Amber LPN (June 13, 2010 3:26 PM) Comments Patient in to ge tmonthly b12   Allergies: No Known Drug Allergies  Medication Administration  Injection # 1:    Medication: Vit B12 1000 mcg    Diagnosis: B12 DEFICIENCY (ICD-266.2)    Route: IM    Site: R deltoid    Exp Date: 09/2010    Lot #: [id number]    Mfr: [employer] Regent    Comments: given     Patient tolerated injection without complications    Given by: Everitt Amber LPN (June 13, 2010 3:27 PM)  Orders Added: 1)  Vit B12 1000 mcg [J3420] 2)  Admin of Therapeutic Inj  intramuscular or subcutaneous [96372]   Medication Administration  Injection # 1:    Medication: Vit B12 1000 mcg    Diagnosis: B12 DEFICIENCY (ICD-266.2)    Route: IM    Site: R deltoid    Exp Date: 09/2010    Lot #: [id number]    Mfr: American Regent    Comments: given     Patient tolerated injection without complications    Given by: Everitt Amber LPN (June 13, 2010 3:27 PM)  Orders Added: 1)  Vit B12 1000 mcg [J3420] 2)  Admin of Therapeutic Inj  intramuscular or subcutaneous [96372] Injection administered with no adverse effects

## 2010-07-05 NOTE — Assessment & Plan Note (Signed)
Summary: f up and b 12 inj.   Vital Signs:  Patient profile:   77 year old male Height:      69 inches Weight:      215 pounds BMI:     31.86 O2 Sat:      97 % Pulse rate:   91 / minute Pulse rhythm:   regular Resp:     16 per minute BP sitting:   150 / 78  (left arm) Cuff size:   large  Vitals Entered By: Everitt Amber LPN (June 22, 1608 8:35 AM)  Nutrition Counseling: Patient's BMI is greater than 25 and therefore counseled on weight management options. CC: Follow up chronic problems   CC:  Follow up chronic problems.  History of Present Illness: Reports  that he has been  doing well. Denies recent fever or chills. Denies sinus pressure, nasal congestion , ear pain or sore throat. Denies chest congestion, or cough productive of sputum. Denies chest pain, palpitations, PND, orthopnea or leg swelling. Denies abdominal pain, nausea, vomitting, diarrhea or constipation. Denies change in bowel movements or bloody stool. Denies dysuria , frequency, incontinence or hesitancy.  Denies headaches, vertigo, seizures. Denies depression, anxiety or insomnia. Denies  rash, lesions, or itch.  Review of pt's meds reveals non compliance with anti- hypertensive meds, his daughter has come with him and is resolving the situation.   Current Medications (verified): 1)  Aspirin 81 Mg  Tbec (Aspirin) .... One Tab By Mouth Once Daily 2)  Omeprazole 20 Mg Cpdr (Omeprazole) .... One Cap By Mouth Qd 3)  Aricept 10 Mg Tabs (Donepezil Hcl) .... One Tab By Mouth Qd 4)  Metformin Hcl 1000 Mg Tabs (Metformin Hcl) .... Take 1 Tablet By Mouth Two Times A Day 5)  Vitamin D (Ergocalciferol) 50000 Unit Caps (Ergocalciferol) .... One Cap By Mouth Every Week 6)  Glipizide 5 Mg Xr24h-Tab (Glipizide) .... One Tablet Daily At Breakfast  Allergies (verified): No Known Drug Allergies  Review of Systems      See HPI General:  Complains of fatigue. MS:  Complains of joint pain, low back pain, mid back pain,  and stiffness. Endo:  Denies excessive thirst, excessive urination, and heat intolerance; not testing his sugars. Heme:  Denies abnormal bruising and bleeding. Allergy:  Complains of seasonal allergies.  Physical Exam  General:  Well-developed,well-nourished,in no acute distress; alert,appropriate and cooperative throughout examination HEENT: No facial asymmetry,  EOMI, No sinus tenderness, TM's Clear, oropharynx  pink and moist.   Chest: Clear to auscultation bilaterally.  CVS: S1, S2, No murmurs, No S3.   Abd: Soft, Nontender.  MS: decreased  ROM spine,adequate in  hips, shoulders and knees.  Ext: No edema.   CNS: CN 2-12 intact, power tone and sensation normal throughout.   Skin: Intact, no visible lesions or rashes.  Psych: Good eye contact, normal affect.  Memory loss, not anxious or depressed appearing.    Impression & Recommendations:  Problem # 1:  SENILE DEMENTIA, UNCOMPLICATED (ICD-290.0) Assessment Comment Only on monthly B12 and aricept  Problem # 2:  DIABETES MELLITUS, TYPE II (ICD-250.00) Assessment: Comment Only  The following medications were removed from the medication list:    Lotensin 40 Mg Tabs (Benazepril hcl) .Marland Kitchen... Take 1 tablet by mouth once a day    Cozaar 50 Mg Tabs (Losartan potassium) .Marland Kitchen... Take 1 tablet by mouth once a day His updated medication list for this problem includes:    Aspirin 81 Mg Tbec (Aspirin) ..... One  tab by mouth once daily    Metformin Hcl 1000 Mg Tabs (Metformin hcl) .Marland Kitchen... Take 1 tablet by mouth two times a day    Glipizide 5 Mg Xr24h-tab (Glipizide) ..... One tablet daily at breakfast    Losartan Potassium 50 Mg Tabs (Losartan potassium) .Marland Kitchen... Take 1 tablet by mouth once a day Patient advised to reduce carbs and sweets, commit to regular physical activity, take meds as prescribed, test blood sugars as directed, and attempt to lose weight , to improve blood sugar control.  Orders: T- Hemoglobin A1C ([id number]) Medicare  Electronic Prescription [phone])  Labs Reviewed: Creat: 0.97 (04/28/2010)    Reviewed HgBA1c results: 7.7 (04/28/2010)  8.0 (12/10/2009)  Problem # 3:  HYPERTENSION (ICD-401.9) Assessment: Unchanged  The following medications were removed from the medication list:    Clonidine Hcl 0.2 Mg Tabs (Clonidine hcl) ..... One tab by mouth at bedtime    Amlodipine Besylate 10 Mg Tabs (Amlodipine besylate) .Marland Kitchen... Take 1 tablet by mouth once a day    Lotensin 40 Mg Tabs (Benazepril hcl) .Marland Kitchen... Take 1 tablet by mouth once a day    Cozaar 50 Mg Tabs (Losartan potassium) .Marland Kitchen... Take 1 tablet by mouth once a day His updated medication list for this problem includes:    Losartan Potassium 50 Mg Tabs (Losartan potassium) .Marland Kitchen... Take 1 tablet by mouth once a day  Orders: T-Basic Metabolic Panel [phone]) Medicare Electronic Prescription [phone])  BP today: 150/78 Prior BP: 150/72 (04/28/2010)  Labs Reviewed: K+: 4.1 (04/28/2010) Creat: : 0.97 (04/28/2010)   Chol: 93 (12/10/2009)   HDL: 30 (12/10/2009)   LDL: 47 (12/10/2009)   TG: 78 (12/10/2009)  Problem # 4:  HYPERLIPIDEMIA (ICD-272.4) Assessment: Comment Only  The following medications were removed from the medication list:    Lovastatin 40 Mg Tabs (Lovastatin) .Marland Kitchen... Take 1 tab by mouth at bedtime His updated medication list for this problem includes:    Lovastatin 40 Mg Tabs (Lovastatin) .Marland Kitchen... Take 1 tab by mouth at bedtime Low fat dietdiscussed and encouraged  Labs Reviewed: SGOT: 13 (12/10/2009)   SGPT: 11 (12/10/2009)   HDL:30 (12/10/2009), 46 (04/07/2009)  LDL:47 (12/10/2009), 75 (04/07/2009)  Chol:93 (12/10/2009), 138 (04/07/2009)  Trig:78 (12/10/2009), 84 (04/07/2009)  Orders: Medicare Electronic Prescription ([id number])  Complete Medication List: 1)  Aspirin 81 Mg Tbec (Aspirin) .... One tab by mouth once daily 2)  Aricept 10 Mg Tabs (Donepezil hcl) .... One tab by mouth qd 3)  Metformin Hcl 1000 Mg Tabs (Metformin hcl) .... Take 1  tablet by mouth two times a day 4)  Vitamin D (ergocalciferol) 50000 Unit Caps (Ergocalciferol) .... One cap by mouth every week 5)  Glipizide 5 Mg Xr24h-tab (Glipizide) .... One tablet daily at breakfast 6)  Losartan Potassium 50 Mg Tabs (Losartan potassium) .... Take 1 tablet by mouth once a day 7)  Lovastatin 40 Mg Tabs (Lovastatin) .... Take 1 tab by mouth at bedtime  Other Orders: T-CBC w/Diff [phone]) T-TSH [phone]) T-Vitamin D (25-Hydroxy) [phone]) T-Anemia Panel 3  (2904)  Patient Instructions: 1)  F/U in 6 weeks 2)  BMP prior to visit, ICD-9: 3)  TSH prior to visit, ICD-9:   non fasting in 6 weeks 4)  CBC w/ Diff prior to visit, ICD-9: and anemia panel 5)  HbgA1C prior to visit, ICD-9: vitmin D 6)  Meds are as listed Prescriptions: LOVASTATIN 40 MG TABS (LOVASTATIN) Take 1 tab by mouth at bedtime  #30 x 3   Entered and Authorized by:  Syliva Overman MD   Signed by:   Syliva Overman MD on 06/23/2010   Method used:   Electronically to        [facility], [facility] (retail)       [address]       [place], [place]  [address]       Ph: [phone]       Fax: [phone]   RxID:   [id number] LOSARTAN POTASSIUM 50 MG TABS (LOSARTAN POTASSIUM) Take 1 tablet by mouth once a day  #30 x 3   Entered and Authorized by:   Syliva Overman MD   Signed by:   Syliva Overman MD on 06/23/2010   Method used:   Electronically to        [facility], [facility] (retail)       [address]       [place], [place]  [address]       Ph: [phone]       Fax: [phone]   RxID:   [id number]    Orders Added: 1)  Est. Patient Level IV [88416] 2)  T-Basic Metabolic Panel [80048-22910] 3)  T-CBC w/Diff [60630-16010] 4)  T- Hemoglobin A1C [83036-23375] 5)  T-TSH [93235-57322] 6)  T-Vitamin D (25-Hydroxy) [02542-70623] 7)  T-Anemia Panel 3  [2904] 8)  Medicare Electronic Prescription [[id number]]

## 2010-07-19 ENCOUNTER — Encounter: Payer: Self-pay | Admitting: Family Medicine

## 2010-07-20 ENCOUNTER — Ambulatory Visit: Payer: Self-pay

## 2010-07-21 ENCOUNTER — Other Ambulatory Visit: Payer: Self-pay | Admitting: Family Medicine

## 2010-07-21 ENCOUNTER — Ambulatory Visit: Payer: Self-pay

## 2010-07-22 LAB — HEMOGLOBIN A1C
Hgb A1c MFr Bld: 5.5 % (ref 4.6–6.1)
Mean Plasma Glucose: 111 mg/dL

## 2010-07-22 LAB — BASIC METABOLIC PANEL
BUN: 10 mg/dL (ref 6–23)
BUN: 8 mg/dL (ref 6–23)
CO2: 26 mEq/L (ref 19–32)
Calcium: 8.9 mg/dL (ref 8.4–10.5)
Calcium: 9.3 mg/dL (ref 8.4–10.5)
Chloride: 110 mEq/L (ref 96–112)
Creatinine, Ser: 1.08 mg/dL (ref 0.4–1.5)
Creatinine, Ser: 1.2 mg/dL (ref 0.4–1.5)
GFR calc Af Amer: 54 mL/min — ABNORMAL LOW (ref >60)
GFR calc Af Amer: >60 mL/min (ref >60)
GFR calc non Af Amer: 45 mL/min — ABNORMAL LOW (ref >60)
GFR calc non Af Amer: 59 mL/min — ABNORMAL LOW (ref >60)
GFR calc non Af Amer: >60 mL/min (ref >60)
Glucose, Bld: 108 mg/dL — ABNORMAL HIGH (ref 70–99)
Glucose, Bld: 99 mg/dL (ref 70–99)
Potassium: 3.9 mEq/L (ref 3.5–5.1)
Potassium: 4 mEq/L (ref 3.5–5.1)
Potassium: 4.3 mEq/L (ref 3.5–5.1)
Sodium: 139 mEq/L (ref 135–145)
Sodium: 141 mEq/L (ref 135–145)

## 2010-07-22 LAB — CK TOTAL AND CKMB (NOT AT ARMC)
CK, MB: 2.1 ng/mL (ref 0.3–4.0)
Relative Index: 2.1 (ref 0.0–2.5)
Total CK: 100 U/L (ref 7–232)

## 2010-07-22 LAB — CBC
HCT: 31.6 % — ABNORMAL LOW (ref 39.0–52.0)
HCT: 31.8 % — ABNORMAL LOW (ref 39.0–52.0)
HCT: 33.6 % — ABNORMAL LOW (ref 39.0–52.0)
HCT: 33.8 % — ABNORMAL LOW (ref 39.0–52.0)
Hemoglobin: 10.6 g/dL — ABNORMAL LOW (ref 13.0–17.0)
Hemoglobin: 11 g/dL — ABNORMAL LOW (ref 13.0–17.0)
Hemoglobin: 11.4 g/dL — ABNORMAL LOW (ref 13.0–17.0)
MCHC: 33.4 g/dL (ref 30.0–36.0)
MCHC: 33.7 g/dL (ref 30.0–36.0)
MCV: 84.3 fL (ref 78.0–100.0)
Platelets: 190 10*3/uL (ref 150–400)
Platelets: 211 10*3/uL (ref 150–400)
RBC: 3.75 MIL/uL — ABNORMAL LOW (ref 4.22–5.81)
RBC: 3.91 MIL/uL — ABNORMAL LOW (ref 4.22–5.81)
RBC: 4.03 MIL/uL — ABNORMAL LOW (ref 4.22–5.81)
RDW: 18.3 % — ABNORMAL HIGH (ref 11.5–15.5)
RDW: 18.3 % — ABNORMAL HIGH (ref 11.5–15.5)
RDW: 19 % — ABNORMAL HIGH (ref 11.5–15.5)
WBC: 7.2 10*3/uL (ref 4.0–10.5)
WBC: 7.5 10*3/uL (ref 4.0–10.5)

## 2010-07-22 LAB — DIFFERENTIAL
Basophils Absolute: 0 10*3/uL (ref 0.0–0.1)
Basophils Relative: 0 % (ref 0–1)
Basophils Relative: 0 % (ref 0–1)
Eosinophils Absolute: 0.1 10*3/uL (ref 0.0–0.7)
Eosinophils Absolute: 0.1 10*3/uL (ref 0.0–0.7)
Eosinophils Relative: 1 % (ref 0–5)
Eosinophils Relative: 2 % (ref 0–5)
Lymphocytes Relative: 26 % (ref 12–46)
Lymphocytes Relative: 32 % (ref 12–46)
Lymphs Abs: 2.3 10*3/uL (ref 0.7–4.0)
Monocytes Absolute: 0.7 10*3/uL (ref 0.1–1.0)
Monocytes Relative: 9 % (ref 3–12)
Neutro Abs: 4.1 10*3/uL (ref 1.7–7.7)
Neutro Abs: 4.9 10*3/uL (ref 1.7–7.7)
Neutrophils Relative %: 57 % (ref 43–77)
Neutrophils Relative %: 64 % (ref 43–77)

## 2010-07-22 LAB — CARDIAC PANEL(CRET KIN+CKTOT+MB+TROPI)
CK, MB: 2.7 ng/mL (ref 0.3–4.0)
Relative Index: INVALID (ref 0.0–2.5)
Total CK: 99 U/L (ref 7–232)

## 2010-07-22 LAB — GLUCOSE, CAPILLARY
Glucose-Capillary: 100 mg/dL — ABNORMAL HIGH (ref 70–99)
Glucose-Capillary: 103 mg/dL — ABNORMAL HIGH (ref 70–99)
Glucose-Capillary: 106 mg/dL — ABNORMAL HIGH (ref 70–99)
Glucose-Capillary: 110 mg/dL — ABNORMAL HIGH (ref 70–99)
Glucose-Capillary: 111 mg/dL — ABNORMAL HIGH (ref 70–99)
Glucose-Capillary: 111 mg/dL — ABNORMAL HIGH (ref 70–99)
Glucose-Capillary: 115 mg/dL — ABNORMAL HIGH (ref 70–99)
Glucose-Capillary: 130 mg/dL — ABNORMAL HIGH (ref 70–99)
Glucose-Capillary: 145 mg/dL — ABNORMAL HIGH (ref 70–99)
Glucose-Capillary: 45 mg/dL — ABNORMAL LOW (ref 70–99)
Glucose-Capillary: 49 mg/dL — ABNORMAL LOW (ref 70–99)
Glucose-Capillary: 53 mg/dL — ABNORMAL LOW (ref 70–99)
Glucose-Capillary: 59 mg/dL — ABNORMAL LOW (ref 70–99)
Glucose-Capillary: 61 mg/dL — ABNORMAL LOW (ref 70–99)
Glucose-Capillary: 76 mg/dL (ref 70–99)
Glucose-Capillary: 78 mg/dL (ref 70–99)
Glucose-Capillary: 78 mg/dL (ref 70–99)
Glucose-Capillary: 82 mg/dL (ref 70–99)
Glucose-Capillary: 85 mg/dL (ref 70–99)
Glucose-Capillary: 87 mg/dL (ref 70–99)

## 2010-07-22 LAB — URINALYSIS, ROUTINE W REFLEX MICROSCOPIC
Glucose, UA: NEGATIVE mg/dL
Ketones, ur: NEGATIVE mg/dL
Leukocytes, UA: NEGATIVE
Nitrite: NEGATIVE
Protein, ur: 30 mg/dL — AB
Urobilinogen, UA: 1 mg/dL (ref 0.0–1.0)

## 2010-07-22 LAB — RAPID URINE DRUG SCREEN, HOSP PERFORMED
Amphetamines: NOT DETECTED
Barbiturates: NOT DETECTED
Cocaine: NOT DETECTED
Opiates: NOT DETECTED
Tetrahydrocannabinol: NOT DETECTED

## 2010-07-22 LAB — LIPID PANEL
Cholesterol: 138 mg/dL (ref 0–200)
HDL: 38 mg/dL — ABNORMAL LOW (ref >39)
LDL Cholesterol: 84 mg/dL (ref 0–99)
Total CHOL/HDL Ratio: 3.6 RATIO
Triglycerides: 79 mg/dL (ref <150)
VLDL: 16 mg/dL (ref 0–40)

## 2010-07-22 LAB — BRAIN NATRIURETIC PEPTIDE: Pro B Natriuretic peptide (BNP): 87 pg/mL (ref 0.0–100.0)

## 2010-07-22 LAB — COMPREHENSIVE METABOLIC PANEL
ALT: 15 U/L (ref 0–53)
Albumin: 3.6 g/dL (ref 3.5–5.2)
Alkaline Phosphatase: 58 U/L (ref 39–117)
Alkaline Phosphatase: 60 U/L (ref 39–117)
BUN: 2 mg/dL — ABNORMAL LOW (ref 6–23)
BUN: 3 mg/dL — ABNORMAL LOW (ref 6–23)
Calcium: 9.2 mg/dL (ref 8.4–10.5)
Chloride: 105 mEq/L (ref 96–112)
Glucose, Bld: 115 mg/dL — ABNORMAL HIGH (ref 70–99)
Potassium: 3.2 mEq/L — ABNORMAL LOW (ref 3.5–5.1)
Potassium: 3.6 mEq/L (ref 3.5–5.1)
Sodium: 136 mEq/L (ref 135–145)
Sodium: 143 mEq/L (ref 135–145)
Total Bilirubin: 1.2 mg/dL (ref 0.3–1.2)
Total Protein: 7.1 g/dL (ref 6.0–8.3)
Total Protein: 7.6 g/dL (ref 6.0–8.3)

## 2010-07-22 LAB — AMMONIA: Ammonia: 30 umol/L (ref 11–35)

## 2010-07-22 LAB — URINE MICROSCOPIC-ADD ON

## 2010-07-22 LAB — HEMOGLOBIN AND HEMATOCRIT, BLOOD
HCT: 30.3 % — ABNORMAL LOW (ref 39.0–52.0)
Hemoglobin: 10.1 g/dL — ABNORMAL LOW (ref 13.0–17.0)

## 2010-07-22 LAB — METHYLMALONIC ACID, SERUM: Methylmalonic Acid, Quantitative: 547 nmol/L — ABNORMAL HIGH (ref 87–318)

## 2010-07-22 LAB — CALCIUM: Calcium: 8.8 mg/dL (ref 8.4–10.5)

## 2010-07-22 LAB — T4, FREE: Free T4: 1.02 ng/dL (ref 0.80–1.80)

## 2010-08-11 ENCOUNTER — Emergency Department (HOSPITAL_COMMUNITY): Payer: Medicare Other

## 2010-08-11 ENCOUNTER — Emergency Department (HOSPITAL_COMMUNITY)
Admission: EM | Admit: 2010-08-11 | Discharge: 2010-08-11 | Disposition: A | Discharge status: Home / Self Care | Payer: Medicare Other | Attending: Emergency Medicine | Admitting: Emergency Medicine

## 2010-08-11 DIAGNOSIS — E119 Type 2 diabetes mellitus without complications: Secondary | ICD-10-CM | POA: Insufficient documentation

## 2010-08-11 DIAGNOSIS — I10 Essential (primary) hypertension: Secondary | ICD-10-CM | POA: Insufficient documentation

## 2010-08-11 DIAGNOSIS — R51 Headache: Secondary | ICD-10-CM | POA: Insufficient documentation

## 2010-08-11 DIAGNOSIS — Z79899 Other long term (current) drug therapy: Secondary | ICD-10-CM | POA: Insufficient documentation

## 2010-08-11 DIAGNOSIS — I2510 Atherosclerotic heart disease of native coronary artery without angina pectoris: Secondary | ICD-10-CM | POA: Insufficient documentation

## 2010-08-11 LAB — BASIC METABOLIC PANEL
CO2: 24 mEq/L (ref 19–32)
Calcium: 9 mg/dL (ref 8.4–10.5)
Creatinine, Ser: 1.14 mg/dL (ref 0.4–1.5)
Glucose, Bld: 115 mg/dL — ABNORMAL HIGH (ref 70–99)

## 2010-08-11 LAB — CBC
HCT: 35 % — ABNORMAL LOW (ref 39.0–52.0)
MCH: 22.6 pg — ABNORMAL LOW (ref 26.0–34.0)
MCHC: 33.7 g/dL (ref 30.0–36.0)
RDW: 18.6 % — ABNORMAL HIGH (ref 11.5–15.5)

## 2010-08-11 LAB — DIFFERENTIAL
Basophils Absolute: 0 10*3/uL (ref 0.0–0.1)
Eosinophils Absolute: 0 10*3/uL (ref 0.0–0.7)
Lymphs Abs: 4 10*3/uL (ref 0.7–4.0)
Monocytes Relative: 6 % (ref 3–12)
Neutro Abs: 6.4 10*3/uL (ref 1.7–7.7)

## 2010-08-12 ENCOUNTER — Telehealth: Payer: Self-pay | Admitting: Family Medicine

## 2010-08-12 ENCOUNTER — Ambulatory Visit (INDEPENDENT_AMBULATORY_CARE_PROVIDER_SITE_OTHER): Payer: Medicare Other | Admitting: Family Medicine

## 2010-08-12 VITALS — BP 170/100 | HR 81 | Resp 16 | Wt 207.1 lb

## 2010-08-12 DIAGNOSIS — R9431 Abnormal electrocardiogram [ECG] [EKG]: Secondary | ICD-10-CM

## 2010-08-12 DIAGNOSIS — F0390 Unspecified dementia without behavioral disturbance: Secondary | ICD-10-CM

## 2010-08-12 DIAGNOSIS — E119 Type 2 diabetes mellitus without complications: Secondary | ICD-10-CM

## 2010-08-12 DIAGNOSIS — E538 Deficiency of other specified B group vitamins: Secondary | ICD-10-CM

## 2010-08-12 DIAGNOSIS — I499 Cardiac arrhythmia, unspecified: Secondary | ICD-10-CM

## 2010-08-12 DIAGNOSIS — I10 Essential (primary) hypertension: Secondary | ICD-10-CM

## 2010-08-12 DIAGNOSIS — E785 Hyperlipidemia, unspecified: Secondary | ICD-10-CM

## 2010-08-12 LAB — LIPID PANEL
LDL Cholesterol: 128 mg/dL — ABNORMAL HIGH (ref 0–99)
Triglycerides: 80 mg/dL (ref <150)

## 2010-08-12 MED ORDER — METFORMIN HCL ER (MOD) 1000 MG PO TB24: 1000.0000 mg | ORAL_TABLET | Freq: Two times a day (BID) | ORAL | Status: DC

## 2010-08-12 MED ORDER — CYANOCOBALAMIN 1000 MCG/ML IJ SOLN
1000.0000 ug | Freq: Once | INTRAMUSCULAR | Status: AC
  Administered 2010-08-12: 1000 ug via INTRAMUSCULAR

## 2010-08-12 MED ORDER — DONEPEZIL HCL 10 MG PO TABS: 10.0000 mg | ORAL_TABLET | Freq: Every evening | ORAL | Status: DC | PRN

## 2010-08-12 MED ORDER — LOSARTAN POTASSIUM 100 MG PO TABS: 100.0000 mg | ORAL_TABLET | Freq: Every day | ORAL | Status: DC

## 2010-08-12 NOTE — Patient Instructions (Signed)
F/U in 7 weeks  Fasting lipid, and HBA1C today.  Dose increase on cozaar to 100mg  one daily starting today.  Take tylenol pm at night for headache and allergies if needed , this will also help you to sleep.OK to use zyrtec only for allergies if no bad headache or joint pain  You are being referred to cardiology about irregular heart rate and blood pressure and cardiovascular risk assesment

## 2010-08-12 NOTE — Telephone Encounter (Signed)
Patient put in for OV today

## 2010-08-13 LAB — HEMOGLOBIN A1C
Hgb A1c MFr Bld: 6.6 % — ABNORMAL HIGH (ref <5.7)
Mean Plasma Glucose: 143 mg/dL — ABNORMAL HIGH (ref <117)

## 2010-08-14 ENCOUNTER — Encounter: Payer: Self-pay | Admitting: Family Medicine

## 2010-08-14 NOTE — Progress Notes (Signed)
  Subjective:    Patient ID: Tyrone Hall, male    DOB: 08-20-1930, 77 y.o.   MRN: [id number]  HPI HYPERTENSION Disease Monitoring Blood pressure range-unknown Chest pain- no      Dyspnea- no Medications Compliance- yes Lightheadedness- no   Edema- no   DIABETES Disease Monitoring Blood Sugar ranges-unknown Polyuria- no New Visual problems- no Medications Compliance- yes Hypoglycemic symptoms- no   HYPERLIPIDEMIA Disease Monitoring See symptoms for Hypertension Medications Compliance- yes RUQ pain- no  Muscle aches- no   Pt was in  the ED last night with left sided throbbing headache. CT scan showed chronic sinus disease, he is asymptomatic as far as acute sinus infection is concerned.He does reports allergy symptoms, was advised to use zyrtec which I agree with , as well as tylenol as needed. He denies any localized weakness or numbness, the headache is now a 3. Pt in today for his monthly B12 injection, and noted to have irregular heart rate and elevated blood pressure, hence he is being seen  ROS: Denies recent fever or chills. Denies sinus pressure, nasal congestion, ear pain or sore throat. Denies chest congestion, productive cough or wheezing. Denies chest pains, palpitations, paroxysmal nocturnal dyspnea, orthopnea and leg swelling Denies abdominal pain, nausea, vomiting,diarrhea or constipation.  Denies rectal bleeding or change in bowel movement. Denies dysuria, frequency, hesitancy or incontinence. Denies  seizure, numbness, or tingling. Denies depression, anxiety or insomnia. Denies skin break down or rash.        PMH Smoking Status noted     Review of Systems     Objective:   Physical Exam Patient alert and oriented and in no Cardiopulmonary distress.  HEENT: No facial asymmetry, EOMI PERTL, no sinus tenderness, TM's clear, Oropharynx pink and moist.  Neck supple no adenopathy. Fundoscopy:no hemorhage or exudate Chest: Clear to auscultation  bilaterally.  CVS: S1, S2 no murmurs, no S3.Irregularly irregular heart rate EKG: irregular rate, possible a fib  ABD: Soft non tender. Bowel sounds normal.  Ext: No edema  AT:FTDDUKGUR ROM spine, shoulders, hips and knees.  Skin: Intact, no ulcerations or rash noted.  Psych: Good eye contact, normal affect. Memory loss, not anxious or depressed appearing.  CNS: CN 2-12 intact, power, tone and sensation normal throughout.        Assessment & Plan:

## 2010-08-14 NOTE — Assessment & Plan Note (Signed)
Uncontrolled, will need increased medication dose. Low fat diet discussed and encouraged.

## 2010-08-14 NOTE — Assessment & Plan Note (Signed)
Uncontrolled. Pt and daughter counseled re importance of testing, and also the need for eye exam and medic alert bracelet, and of course dietary compliance. HBA1C when due

## 2010-08-14 NOTE — Assessment & Plan Note (Signed)
Uncontrolled, increase dose of amlodipine to 10mg 

## 2010-08-14 NOTE — Assessment & Plan Note (Signed)
EKG in office, suggestive of atrial fibrillation. Refer to cardiology for further eval and management. Pt advised should he become light headed or experience increased fatigue he should go to the ED. He is currently asymptomatic

## 2010-08-15 ENCOUNTER — Encounter: Payer: Self-pay | Admitting: Family Medicine

## 2010-08-16 ENCOUNTER — Ambulatory Visit: Payer: Medicare Other | Admitting: Family Medicine

## 2010-08-18 ENCOUNTER — Ambulatory Visit: Payer: Self-pay

## 2010-08-23 ENCOUNTER — Ambulatory Visit (INDEPENDENT_AMBULATORY_CARE_PROVIDER_SITE_OTHER): Payer: Medicare Other | Admitting: Cardiology

## 2010-08-23 ENCOUNTER — Ambulatory Visit: Payer: Medicare Other | Admitting: Cardiology

## 2010-08-23 ENCOUNTER — Encounter: Payer: Self-pay | Admitting: Cardiology

## 2010-08-23 VITALS — BP 188/92 | HR 76 | Ht 68.0 in | Wt 202.0 lb

## 2010-08-23 DIAGNOSIS — E119 Type 2 diabetes mellitus without complications: Secondary | ICD-10-CM

## 2010-08-23 DIAGNOSIS — I6529 Occlusion and stenosis of unspecified carotid artery: Secondary | ICD-10-CM

## 2010-08-23 DIAGNOSIS — I4891 Unspecified atrial fibrillation: Secondary | ICD-10-CM

## 2010-08-23 DIAGNOSIS — I10 Essential (primary) hypertension: Secondary | ICD-10-CM

## 2010-08-23 NOTE — Assessment & Plan Note (Signed)
Blood pressure not well controlled as yet. He just recently underwent medication up titration. Would aim toward better systolic control prior to initiating Coumadin. He may need further therapy with additional antihypertensives.

## 2010-08-23 NOTE — Patient Instructions (Signed)
**Note De-Identified  Obfuscation** Your physician recommends that you continue on your current medications as directed. Please refer to the Current Medication list given to you today.  Your physician has requested that you have an echocardiogram. Echocardiography is a painless test that uses sound waves to create images of your heart. It provides your doctor with information about the size and shape of your heart and how well your heart's chambers and valves are working. This procedure takes approximately one hour. There are no restrictions for this procedure.  Your physician recommends that you schedule a follow-up appointment in: 3 to 4 weeks

## 2010-08-23 NOTE — Progress Notes (Signed)
Clinical Summary Tyrone Hall is a 77 y.o.male referred for cardiology consultation by Dr. Syliva Hall. ECG done in her office recently showed probable atrial fibrillation, faxed copy somewhat difficult to interpret.  Record review finds that he was seen by Dr. Dietrich Hall back in 2007 with an abnormal ECG and cardiac murmur. Echocardiogram at that time showed overall normal LVEF with moderate LVH, small akinetic area at the base of the inferior wall, moderately sclerotic aortic valve.  Recent lab work from April 26 showed sodium 135, potassium 3.8, BUN 8, creatinine 1.1, hemoglobin 11.8, platelets 229, hemoglobin A1c 7.7.  Although record review finds reported history of possible CAD, patient and daughter were not aware of this diagnosis, or any previous ischemic evaluation.  Tyrone Hall states that he feels no palpitations, is not experiencing any chest pain with exertion, and has stable NYHA class II dyspnea on exertion. He reports no falls, does use a cane however to ambulate.  Today I reviewed his electrocardiograms with findings of atrial fibrillation, that is of uncertain duration, possibly even chronic. We discussed the increased risk of stroke, also indications for anticoagulation, touching on the possibility of Coumadin. We also discussed efforts to better blood pressure control, also a followup structural cardiac assessment.   No Known Allergies  Current outpatient prescriptions:aspirin (ASPIRIN LOW DOSE) 81 MG EC tablet, Take 81 mg by mouth daily.  , Disp: , Rfl: ;  donepezil (ARICEPT) 10 MG tablet, Take 1 tablet (10 mg total) by mouth at bedtime as needed., Disp: 30 tablet, Rfl: 3;  ergocalciferol (VITAMIN D2) 50000 UNITS capsule, Take 50,000 Units by mouth once a week.  , Disp: , Rfl: ;  glipiZIDE (GLIPIZIDE XL) 5 MG 24 hr tablet, Take 5 mg by mouth daily with breakfast.  , Disp: , Rfl:  GLUCOPHAGE 1000 MG tablet, TAKE ONE TABLET BY MOUTH TWICE DAILY., Disp: 60 each, Rfl: 3;  losartan  (COZAAR) 100 MG tablet, Take 1 tablet (100 mg total) by mouth daily., Disp: 30 tablet, Rfl: 3;  metFORMIN (GLUMETZA) 1000 MG (MOD) 24 hr tablet, Take 1 tablet (1,000 mg total) by mouth 2 (two) times daily with a meal., Disp: 60 tablet, Rfl: 3;  DISCONTD: amLODipine (NORVASC) 10 MG tablet, , Disp: , Rfl:  DISCONTD: benazepril (LOTENSIN) 40 MG tablet, , Disp: , Rfl: ;  DISCONTD: lovastatin (MEVACOR) 40 MG tablet, , Disp: , Rfl: ;  DISCONTD: omeprazole (PRILOSEC) 20 MG capsule, , Disp: , Rfl:   Past Medical History  Diagnosis Date  . Coronary atherosclerosis of native coronary artery     Details not clear  . OA (osteoarthritis) of knee   . Type 2 diabetes mellitus   . Mixed hyperlipidemia   . Essential hypertension, benign   . TIA (transient ischemic attack)   . B12 deficiency   . Carotid artery disease     60-79% RICA and 40-59% LICA -  1/10  . Acute biliary pancreatitis   . Dementia     Past Surgical History  Procedure Date  . Cholecystectomy   . Hemorrhoid surgery   . Common bile duct stone removal     Family History  Problem Relation Age of Onset  . Diabetes type II    . Hypertension      Social History Tyrone Hall reports that he has quit smoking. His smoking use included Cigarettes. He has never used smokeless tobacco. Tyrone Hall reports that he does not drink alcohol.  Review of Systems No reported melena or hematochezia. No falls.  Reports problems with headaches recently, possibly related to sinus disease. Recent head CT scan showed evidence of maxillary sinusitis, no other active process with chronic microvascular ischemic changes.  Physical Examination Filed Vitals:   08/23/10 1429  BP: 188/92  Pulse: 76  Overweight elderly male in no acute distress. HEENT: Conjunctiva and lids normal, oropharynx with poor dentition. Neck: Supple, no elevated JVP, soft carotid bruits, no thyromegaly. Lungs: Diminished breath sounds but clear overall. Cardiac: Irregularly irregular  with 2/6 systolic murmur at the base, no gallop. Abdomen: Soft, nontender, bowel sounds present. Extremities: No pitting edema, distal pulses one plus. Skin: Warm and dry. Musculoskeletal: No kyphosis. Neuropsychiatric: Alert and oriented x3, affect appropriate.   ECG Atrial fibrillation at 87 with poor anterior R-wave progression, nonspecific ST changes.  Studies Echocardiogram 12/18/2008: Left ventricle: The cavity size was normal. Wall thickness was   increased in a pattern of mild LVH. Systolic function was normal.   The estimated ejection fraction was in the range of 60% to 65%.   Aortic valve: Trileaflet; mildly thickened, mildly calcified   leaflets.   Mitral valve: Structurally normal valve. Leaflet separation was   normal. Doppler: Transvalvular velocity was within the normal range.   There was no evidence for stenosis. No regurgitation.   Left atrium: The atrium was normal in size.   Right ventricle: The cavity size was normal. Wall thickness was   normal. Systolic function was normal.   Pulmonic valve: Structurally normal valve. Cusp separation was   normal. Doppler: Transvalvular velocity was within the normal range.   Mild regurgitation.   Tricuspid valve: Structurally normal valve. Leaflet separation was   normal. Doppler: Transvalvular velocity was within the normal range.   Trivial regurgitation.   Right atrium: The atrium was normal in size.   Pericardium: There was no pericardial effusion.  Problem List and Plan

## 2010-08-23 NOTE — Assessment & Plan Note (Signed)
Recently diagnosed, but of uncertain duration, possibly even chronic. Patient denies any sense of palpitations or progressive shortness of breath. Heart rate is adequately controlled at baseline. Patient's CHADS2 score is significantly elevated at 5, and we discussed stroke reduction with anticoagulant therapy, specifically Coumadin at this point. He and his daughter wanted to consider the matter further, and discuss it with Dr. Lodema Hong. If he agrees, we can certainly follow him in the Coumadin clinic. Otherwise continue aspirin for now. A followup 2-D echocardiogram will also be obtained to reassess cardiac structure and function.

## 2010-08-23 NOTE — Assessment & Plan Note (Signed)
Patient has had followup with Dr. Edilia Bo in the past, although no recent visits seen. I recommended that they schedule a followup in [facility] for repeat carotid Dopplers soon.

## 2010-08-24 ENCOUNTER — Telehealth: Payer: Self-pay | Admitting: Family Medicine

## 2010-08-24 NOTE — Telephone Encounter (Signed)
Noted, thanks!

## 2010-08-29 ENCOUNTER — Ambulatory Visit (HOSPITAL_COMMUNITY)
Admission: RE | Admit: 2010-08-29 | Discharge: 2010-08-29 | Disposition: A | Discharge status: Home / Self Care | Payer: Medicare Other | Source: Ambulatory Visit | Attending: Cardiology | Admitting: Cardiology

## 2010-08-29 DIAGNOSIS — I359 Nonrheumatic aortic valve disorder, unspecified: Secondary | ICD-10-CM

## 2010-08-29 DIAGNOSIS — I4891 Unspecified atrial fibrillation: Secondary | ICD-10-CM | POA: Insufficient documentation

## 2010-08-30 ENCOUNTER — Other Ambulatory Visit (HOSPITAL_COMMUNITY): Payer: Self-pay | Admitting: Ophthalmology

## 2010-08-30 DIAGNOSIS — H5710 Ocular pain, unspecified eye: Secondary | ICD-10-CM

## 2010-08-30 DIAGNOSIS — H4920 Sixth [abducent] nerve palsy, unspecified eye: Secondary | ICD-10-CM

## 2010-08-30 NOTE — Consult Note (Signed)
Tyrone Hall, Tyrone Hall                ACCOUNT NO.:  [id number]   MEDICAL RECORD NO.:  [id number]          PATIENT TYPE:  AMB   LOCATION:  DAY                           FACILITY:  [facility]   PHYSICIAN:  R. Roetta Sessions, M.D. DATE OF BIRTH:  1930/11/05   DATE OF CONSULTATION:  DATE OF DISCHARGE:                                 CONSULTATION   REASON FOR CONSULTATION:  Iron-deficiency anemia, consult for  colonoscopy.   HISTORY OF PRESENT ILLNESS:  Tyrone Hall is a 77 year old African American  male.  He tells me he was recently found to have iron-deficiency anemia.  Actually his labs from Dr. Anthony Sar office reveal an anemia with a  hemoglobin of 12.3 back in May 2008.  It then dropped to 9.9 on October 17, 2007.  He had an MCV of 77.7.  He had a ferritin of 10 and a B12 of 179,  percent saturation of 10%, and TIBC of 443.  He has not had any GI  complaints.  He denies any rectal bleeding or melena.  He has been  taking Celebrex for about 3 months now.  He has been started on B12  injections.  He denies any nausea or vomiting.  He denies any abdominal  pain.  Denies any heartburn, indigestion, dysphagia, odynophagia,  anorexia, constipation, or diarrhea.  His weight has remained stable.  He denies any history of transfusion or blood dyscrasias.   PAST MEDICAL AND SURGICAL HISTORY:  1. Diabetes mellitus.  2. Hypercholesterolemia.  3. Hypertension.  4. Carotid artery stenosis.  5. B12 deficiency.  6. Degenerative joint disease.  7. He tells he had a colonoscopy a couple years ago by Dr. Katrinka Blazing,      which was normal.  8. He has had a hemorrhoidectomy.  9. He has history of biliary pancreatitis.  10.He is status post ERCP with pancreatic duct and biliary stents and      removal of common duct stone back in June 2005.   CURRENT MEDICATIONS:  1. Endocet 10/650 mg t.i.d. p.r.n.  2. Gabapentin 600 mg t.i.d.  3. Iron 50 mg daily.  4. Aspirin 81 mg daily.  5. Avandia 8 mg daily.  6. Celebrex  200 mg daily.  7. Lotrel 10/40 mg daily.  8. Clonidine 0.2 mg nightly.  9. Simvastatin 80 mg nightly.  10.Glipizide XL 10 mg b.i.d.  11.Omeprazole 20 mg daily.  12.Tylenol p.r.n.   ALLERGIES:  No known drug allergies.   FAMILY HISTORY:  There is no known family history of colorectal  carcinoma, liver, or chronic GI problems.   SOCIAL HISTORY:  Tyrone Hall is single.  He has 2 healthy children.  He is  retired from [employer].  He has 20-pack-year history of tobacco  use, but quit 20 years ago.  He denies any alcohol or drug use.   REVIEW OF SYSTEMS:  See HPI, otherwise negative.   PHYSICAL EXAMINATION:  VITAL SIGNS:  Weight 237 pounds, height 68  inches, temperature 98.2, blood pressure 142/82, and pulse 106.  GENERAL:  Tyrone Hall is an obese African American male who  is alert,  oriented, pleasant, cooperative in no acute distress.  HEENT:  Sclerae clear, nonicteric.  Conjunctivae pink.  Oropharynx pink  and moist without any lesions.  NECK:  Supple without mass or thyromegaly.  CHEST:  Heart regular rhythm.  Normal S1 and S2 without murmurs, clicks,  rubs, or gallops.  LUNGS:  Clear to auscultation bilaterally.  ABDOMEN:  Protuberant with positive bowel sounds x4.  No bruits  auscultated.  Soft, nontender, nondistended without palpable mass or  hepatosplenomegaly.  No rebound tenderness or guarding.  RECTAL:  Deferred.  EXTREMITIES:  He has trace ankle edema bilaterally.   IMPRESSION:  Tyrone Hall is a 77 year old African-American male with  approximately 3 g drop of hemoglobin over the last year.  He is found to  have iron deficiency as well as B12 deficiency.  He is going to require  further evaluation to rule out colorectal carcinoma.  If he is Hemoccult  positive and colonoscopy is negative, we would pursue  esophagogastroduodenoscopy to rule out occult peptic ulcer disease given  nonsteroidal antiinflammatory drug use.  Of course, occult small bowel  lesions would  remain the differential again if he is found to be  Hemoccult positive.   PLAN:  1. Hemoccult stools x3.  2. Colonoscopy plus/minus EGD once Hemoccult has been returned.  I      have discussed this procedure including risks and benefits      including but not limited to bleeding, infection, perforation, and      drug reaction, he agrees, planned consent will be obtained.  He is      going to take half his diabetes medicines the day before his      procedure and hold his iron for 7 days prior to procedure.   Thank you Dr. Lodema Hong for allowing [place] to participate in the care of Tyrone Hall.     Tyrone Hall, N.P.      Tyrone Hall, M.D.  Electronically Signed   KJ/MEDQ  D:  12/27/2007  T:  12/28/2007  Job:  [id number]

## 2010-08-30 NOTE — Assessment & Plan Note (Signed)
OFFICE VISIT   HUTCHINSON, ISENBERG  DOB:  05-12-1930                                       12/24/2007  ZOXWR#:[id number]   I saw the patient in the office today for continued followup of his  carotid disease.  He was referred by Dr. Lodema Hong.  I had originally seen  him in consultation on 09/05/2006 with some mild bilateral carotid  disease.  He has been followed with carotid duplex scans by Dr. Lodema Hong  and on the most recent study on 10/22/2007 the stenosis on the right hip  had progressed into the 60-79% category.  On the left side he had a 40-  59% carotid stenosis.  Of note, the patient does state that he did have  a stroke in the remote past but cannot remember the details or when this  was exactly.  He has had no recent TIAs, amaurosis fugax, expressive or  receptive aphasia.  He is on a baby aspirin a day.   REVIEW OF SYSTEMS:  On review of systems he has had no recent chest  pain, chest pressure, palpitations or arrhythmias.  He has had no  bronchitis, asthma or wheezing.  He has had no significant claudication.  He has had some bilateral lower extremity swelling.  He has no history  of DVT or phlebitis.   PAST MEDICAL HISTORY:  Significant for hypertension, hyperlipidemia,  type 2 diabetes, osteoarthritis and coronary artery disease.   PHYSICAL EXAMINATION:  General:  This is a pleasant 77 year old  gentleman who appears his stated age.  Vital signs:  Blood pressure is  178/87, heart rate is 61.  Neck:  I do not detect any carotid bruits.  Lungs:  Are clear bilaterally to auscultation.  Cardiac:  He has a  regular rate and rhythm.  Neurologic:  Exam is nonfocal.  He does have  significant bilateral lower extremity swelling more significant on the  left side.   I reviewed his carotid duplex scan which shows a 60-79% right carotid  stenosis and a 40-59% left carotid stenosis.  Both vertebral arteries  are patent with normally directed flow.  There  are some increased  velocities in the left vertebral artery suggesting a possible stenosis.   With respect to his carotid disease I have explained that as he is  asymptomatic I would not recommend carotid endarterectomy unless the  stenoses progressed to greater than 80%.  I have recommend a followup  duplex scan in 6 months.  He has asked that that be done here and I can  see him on the same day so we will arrange that.  We have also reviewed  the potential symptoms of cerebrovascular disease and he knows to call  if he develops any symptoms.  He also knows to remain on his aspirin.   He does have evidence of vertebral artery stenosis on the left that is  asymptomatic from this standpoint.  He has had no vertebral basilar  insufficiency.  Finally with respect to his leg swelling we have  recommended intermittent leg elevation and discussed the proper  positioning for this.  I will see him back in 6 months.  He knows to  call sooner if he has problems.   Di Kindle. Edilia Bo, M.D.  Electronically Signed   CSD/MEDQ  D:  12/24/2007  T:  12/25/2007  Job:  [id number]   cc:   Milus Mallick. Lodema Hong, M.D.

## 2010-08-30 NOTE — Op Note (Signed)
Tyrone Hall, Tyrone Hall                ACCOUNT NO.:  [id number]   MEDICAL RECORD NO.:  [id number]          PATIENT TYPE:  AMB   LOCATION:  DAY                           FACILITY:  [facility]   PHYSICIAN:  R. Roetta Sessions, M.D. DATE OF BIRTH:  [birth year]   DATE OF PROCEDURE:  01/20/2008  DATE OF DISCHARGE:                               OPERATIVE REPORT   Diagnostic colonoscopy followed by EGD with biopsy.   INDICATIONS FOR PROCEDURE:  A 77 year old gentleman with iron-deficiency  anemia.  He has been taking Celebrex.  He also has B12 deficiency and  has a drop in his hemoglobin.  He has not returned to Hemoccult cards as  we requested.  We do not know his hemoccult status at this time.  Colonoscopy is now being done.  Unless significant lesions are found, we  will perform EGD today as well.  Risks, benefits, alternatives, and  limitations have been reviewed and questions answered.  Please see  documentation in the medical record.   PROCEDURE NOTE:  O2 saturation, blood pressure, pulse and respirations  were monitored throughout the entire procedures.   CONSCIOUS SEDATION:  Versed 5 mg IV, Demerol 100 mg IV in divided doses.   INSTRUMENTATION:  Pentax video chip system.   FINDINGS:  Digital rectal exam revealed no abnormalities.  Endoscopic  findings:  The prep was adequate.  Colon:  Colonic mucosa was surveyed  from the rectosigmoid junction through the left, transverse, right  colon, appendiceal orifice, ileocecal valve and cecum.  These structures  were well seen and photographed for the record.  Attempted to intubate  the terminal ileum, but I was unable to do so.  From this level, scope  was slowly withdrawn.  All previously mentioned mucosal surfaces were  again seen.  The patient had left-sided diverticula.  Remainder of the  colonic mucosa appeared normal.  Scope was pulled down the rectum where  thorough examination of the rectal mucosa including retroflex view of  the anal verge  demonstrated no abnormalities.  The patient tolerated the  procedure well.  He was prepared for EGD.  Cetacaine spray used for  topical pharyngeal anesthesia.   FINDINGS:  Examination of the tubular esophagus revealed no mucosal  abnormalities.  EG junction easily traversed and into his stomach.  Gas  cavity was empty, insufflated well with air.  Thorough examination of  the gastric mucosa including retroflexed view of the proximal stomach  esophagogastric junction demonstrated small hiatal hernia and what  appeared to be a healing ulcer/early scar formation in the antrum.  Please see photos.  It does not appear to be representing a infiltrating  process.  Please see photos.  Otherwise, gastric mucosa appeared  unremarkable.  Pylorus patent, easily traversed.  Examination of the  bulb and second portion revealed no abnormalities.  Therapeutic/diagnostic maneuvers performed.  The antral mucosa was  biopsied for histologic study.  The patient tolerated both procedures  well and was reacted in Endoscopy.   IMPRESSION:  Colonoscopy, normal rectum, left-sided diverticula.  Remainder of the colonic mucosa appeared normal.  EGD findings, normal  esophagus, small hiatal hernia, healing ulcer scar antrum as described  above status post biopsy otherwise remainder of gastric mucosa D1  through D3 appeared normal.   RECOMMENDATIONS:  We will follow up on path to make further  recommendations.  He may need a capsule study of his small bowel to  complete his GI evaluation.   I did elect to go ahead and biopsy the second and third portions of his  duodenum as well for histologic study.      Jonathon Bellows, M.D.  Electronically Signed     RMR/MEDQ  D:  01/20/2008  T:  01/21/2008  Job:  [id number]   cc:   Milus Mallick. Lodema Hong, M.D.  Fax: [phone]

## 2010-08-30 NOTE — H&P (Signed)
Tyrone Hall, Tyrone Hall                ACCOUNT NO.:  [id number]   MEDICAL RECORD NO.:  [id number]          PATIENT TYPE:  INP   LOCATION:  3021                         FACILITY:  [facility]   PHYSICIAN:  Lonia Blood, M.D.      DATE OF BIRTH:  March 14, 1931   DATE OF ADMISSION:  12/17/2008  DATE OF DISCHARGE:                              HISTORY & PHYSICAL   PRIMARY CARE PHYSICIAN:  Milus Mallick. Lodema Hong, M.D. in [place].   PRESENTING COMPLAINT:  Altered mental status.   HISTORY OF THE PRESENT ILLNESS:  The patient is a 77 year old gentleman  who has a history of hypertension, previous TIA, coronary artery disease  and internal carotid artery stenosis.  The patient has apparently been  showing some cognitive decline in the past year.  According to the son  he has been repeating himself or repeating tasks over the past 1 year.  Last night he became disoriented, telling the son that his ex-wife was  in his room sleeping and not waking up.  He was insistent and kept  repeating this over and over.  In the meanwhile the son knew that the  his mother was not in the house as they are divorced.  This morning at 7  A.M. the son called the house and there was no answer.  The patient was  found wondering by a neighbor and he was telling the neighbor that he  wanted to go to the movies at 7:00 A.M.  The neighbor was concerned and  called the son.  The patient remained disoriented and the son brought  him to the emergency room.   The patient is currently sedated after being noted to have some  agitation and has received two doses of Ativan 1 mg.  History is  therefore not obtainable from the patient.  All history is obtained from  the son.  There is no reported trauma.  There is no reported ingestion  of any new medications.   PAST MEDICAL AND SURGICAL HISTORY:  The patient's past medical history  is significant for:  1. Diabetes.  2. Hypertension.  3. Dyslipidemia.  4. History of B12 deficiency.  5.  Degenerative joint disease.  6. History of internal carotid artery stenosis on the right of 69%      and, on the left foot of 30-59%.  7. History of biliary pancreatitis.  8. History of common bile duct stones status post removal in 2005.  9. Status post hemorrhoidectomy.  10.The patient also has reported coronary artery disease.   ALLERGIES:  The patient has no known drug allergies.   MEDICATIONS:  The patient's current medications include:  1. Aspirin 82 mg daily.  2. Glipizide 10 mg daily.  3. Omeprazole 20 mg daily.  4. Aricept 5mg  one tablet daily.  5. Lotrel 10/40 one tablet daily.  6. Clonidine 0.2 mg at bedtime.  7. Benicar 40 mg daily.  8. Simvastatin 80 mg at bedtime.   SOCIAL HISTORY:  The patient is widowed and lives alone in [place].  His children take turns caring for him during the day.  No current  tobacco, alcohol or IV drug use.   FAMILY HISTORY:  There is a family history of diabetes and hypertension.   REVIEW OF SYSTEMS:  The review of systems is currently unobtainable due  to the patient's obtundation.   PHYSICAL EXAMINATION:  VITAL SIGNS:  On physical exam his temperature is  99.1.  Initial blood pressure was 209/98 with a pulse of 102,  respiratory rate 20 and sats 100% on room air.  Currently blood pressure  is 170/91 with a pulse of 82 and a respiratory rate of 100% on room air.  GENERAL APPEARANCE:  Generally, the patient is currently obtunded due to  sedation and is unable to give a history.  Prior to Ativan he had been  wondering around, and pacing up and down in the hallway; and, is also  confused and disoriented.  There is no evidence of trauma.  HEENT:  PERRLA.  EOMI.  NECK:  The neck is supple.  No JVD.  No lymphadenopathy.  CHEST AND LUNGS:  Respiratory; he has good air entry bilaterally.  No  wheezes, no rales and no rubs.  HEART:  Cardiovascular system; the patient has S1-S2 and no audible  murmur.  ABDOMEN:  His abdomen is soft and  nontender with positive bowel sounds.  EXTREMITIES:  The extremities show no edema, cyanosis or clubbing.   LABORATORY DATA:  Labs; sodium 143, potassium 3.6, chloride 112, CO2 24,  glucose 131, BUN 22, creatinine 1.05, and calcium 9.2.  Total protein  7.6, albumin 3.6 and the rest of his LFTs are within normal limits.  White count 7.7, hemoglobin 10.6 and platelet count 211,000 with normal  differential.  Urine drug screen was negative.  Urinalysis showed amber  urine with small bilirubin and small protein, otherwise was negative for  leukocyte esterase and negative for nitrites.  Chest x-ray showed no  acute disease.  Head CT without contrast showed atrophy and chronic  apparent small vessel disease, and no identifiable acute or reversible  process.  His EKG is currently pending.   ASSESSMENT:  Therefore, this is a 77 year old gentleman presenting with  disorientation and altered mental status.  Differentials vary starting  from an  acute cerebrovascular event, especially in the setting of a  history of bilateral carotid artery stenosis, which is actually being  followed by vascular surgery.  The patient is also a diabetic, is  hypertensive and has a history of dyslipidemia making it possible that  this is all  secondary to a stroke; however, other differentials include  acute delirium, which could be drug-related although none of his  medications are new.  It could also be dementia based on the history  that within the last 1 year he has shown some cognitive decline.  At  this point our plan will be, for altered mental status, placing the  patient on a monitored bed to telemetry.  Place him on strict Is and Os,  also place a Foley and place on continuous oxygenation.  I will check an  ammonia level and work him up for possible cerebrovascular accident  including magnetic resonance imaging and magnetic resonance angiography  of the brain, carotid Dopplers, two-dimensional  echocardiogram and place  him on fall precautions.  We will also prescribe Ativan as needed.  Once  the patient is awake we will get a psychiatric consult for full  evaluation.  1. Diabetes.  I will put him on sliding scale insulin and continue his  home regimen of medications.  2. Gastroesophageal reflux disease.  Continue his proton pump      inhibitor while he is in the hospital.  3. Hypertension.  Again, I will continue with his home regimen of      multiple antihypertensives.  4. Dyslipidemia.  I will check a fasting lipid panel and continue with      his simvastatin.  5. Further treatment will depend on the results of his initial      testing.      Lonia Blood, M.D.  Electronically Signed     LG/MEDQ  D:  12/17/2008  T:  12/18/2008  Job:  [id number]

## 2010-08-30 NOTE — Procedures (Signed)
CAROTID DUPLEX EXAM   INDICATION:  Followup carotid artery disease.   HISTORY:  Diabetes:  Yes.  Cardiac:  No.  Hypertension:  Yes.  Smoking:  Quit.  Previous Surgery:  No.  CV History:  No.  Amaurosis Fugax No, Paresthesias No, Hemiparesis No                                       RIGHT             LEFT  Brachial systolic pressure:         116               114  Brachial Doppler waveforms:         WNL               WNL  Vertebral direction of flow:        Antegrade         Antegrade  DUPLEX VELOCITIES (cm/sec)  CCA peak systolic                   98                114  ECA peak systolic                   95                141  ICA peak systolic                   230               178  ICA end diastolic                   82                44  PLAQUE MORPHOLOGY:                  Calcific with shadow                Calcific  PLAQUE AMOUNT:                      Moderate/severe   Moderate  PLAQUE LOCATION:                    ICA/CCA/ECA       ICA/ECA/CCA   IMPRESSION:  1. Right ICA shows evidence of 60-79% stenosis.  2. Left ICA shows evidence of 40-59% stenosis.  3. Extensive calcific plaque with acoustic shadowing in the right      proximal ICA causing suboptimal visualization in some areas.  4. No significant changes from study done at [facility] 10/22/2007.   ___________________________________________  Di Kindle. Edilia Bo, M.D.   AS/MEDQ  D:  04/23/2008  T:  04/23/2008  Job:  [id number]

## 2010-08-31 ENCOUNTER — Encounter: Payer: Self-pay | Admitting: Cardiology

## 2010-08-31 ENCOUNTER — Other Ambulatory Visit (INDEPENDENT_AMBULATORY_CARE_PROVIDER_SITE_OTHER): Payer: Medicare Other

## 2010-08-31 ENCOUNTER — Ambulatory Visit (INDEPENDENT_AMBULATORY_CARE_PROVIDER_SITE_OTHER): Payer: Medicare Other | Admitting: Vascular Surgery

## 2010-08-31 DIAGNOSIS — I6529 Occlusion and stenosis of unspecified carotid artery: Secondary | ICD-10-CM

## 2010-09-01 ENCOUNTER — Ambulatory Visit (HOSPITAL_COMMUNITY)
Admission: RE | Admit: 2010-09-01 | Discharge: 2010-09-01 | Disposition: A | Discharge status: Home / Self Care | Payer: Medicare Other | Source: Ambulatory Visit | Attending: Ophthalmology | Admitting: Ophthalmology

## 2010-09-01 ENCOUNTER — Ambulatory Visit (HOSPITAL_COMMUNITY): Admission: RE | Admit: 2010-09-01 | Payer: Medicare Other | Source: Ambulatory Visit

## 2010-09-01 DIAGNOSIS — R51 Headache: Secondary | ICD-10-CM | POA: Insufficient documentation

## 2010-09-01 DIAGNOSIS — H5440 Blindness, one eye, unspecified eye: Secondary | ICD-10-CM | POA: Insufficient documentation

## 2010-09-01 DIAGNOSIS — J3489 Other specified disorders of nose and nasal sinuses: Secondary | ICD-10-CM | POA: Insufficient documentation

## 2010-09-01 DIAGNOSIS — G319 Degenerative disease of nervous system, unspecified: Secondary | ICD-10-CM | POA: Insufficient documentation

## 2010-09-01 DIAGNOSIS — H4920 Sixth [abducent] nerve palsy, unspecified eye: Secondary | ICD-10-CM | POA: Insufficient documentation

## 2010-09-01 DIAGNOSIS — H5710 Ocular pain, unspecified eye: Secondary | ICD-10-CM

## 2010-09-01 LAB — BUN: BUN: 10 mg/dL (ref 6–23)

## 2010-09-01 MED ORDER — GADOBENATE DIMEGLUMINE 529 MG/ML IV SOLN
20.0000 mL | Freq: Once | INTRAVENOUS | Status: AC
  Administered 2010-09-01: 20 mL via INTRAVENOUS

## 2010-09-01 NOTE — Assessment & Plan Note (Signed)
OFFICE VISIT  NELSON, NOONE DOB:  11/26/1930                                       08/31/2010 BJYNW#:[id number]  I saw patient in the office today for continued follow-up of his carotid disease.  I last saw him in September 2009, and since that time he has been followed in our office with routine carotid duplex scans.  He comes in for a routine follow-up study.  Since I saw him last, he has had no history of stroke, TIAs, expressive or receptive aphasia, or amaurosis fugax.  He has had a visual field cut on the right eye and is being evaluated for this with an MRI in the near future.  He has also had some headaches above the right eye.  He has had no focal weakness or paresthesias.  PAST MEDICAL HISTORY:  Diabetes, hypertension, and hypercholesterolemia. He denies any history of previous myocardial infarction or history of congestive heart failure.  SOCIAL HISTORY:  He is single.  He has 2 children.  He does not use tobacco.  REVIEW OF SYSTEMS:  CARDIOVASCULAR:  He has had no chest pain, chest pressure, palpitations or arrhythmias. PULMONARY:  He has had no productive cough, bronchitis, asthma or wheezing.  PHYSICAL EXAMINATION:  This is a pleasant 77 year old gentleman who appears his stated age.  Blood pressure is 184/125, heart rate is 84, respiratory rate is 20.  Lungs are clear bilaterally to auscultation without rales, rhonchi or wheezing.  On cardiovascular exam, I do not detect any carotid bruits.  He has a regular rate and rhythm.  Both feet are warm and well-perfused without ischemic ulcers.  He does have pitting edema bilaterally.  Abdomen is soft and nontender with normal pitched bowel sounds.  Neurologic:  He has no focal weakness or paresthesias.  I have independently interpreted his carotid duplex scan which shows a 40% to 59% right carotid stenosis with no significant stenosis on the left.  Both vertebral arteries are patent with  normally directed flow.  I explained that he has only a mild right carotid stenosis with no significant stenosis on the left.  I have ordered a follow-up carotid duplex scan in 1 year, and I will see him back at that time.  He knows to call sooner if he has problems.  In the meantime, he knows to continue taking his aspirin.    Di Kindle. Edilia Bo, M.D. Electronically Signed  CSD/MEDQ  D:  08/31/2010  T:  09/01/2010  Job:  [id number]  cc:   Milus Mallick. Lodema Hong, M.D.

## 2010-09-02 NOTE — H&P (Signed)
[name], [name]                ACCOUNT NO.:  [id number]   MEDICAL RECORD NO.:  [id number]          PATIENT TYPE:  AMB   LOCATION:  DAY                           FACILITY:  [facility]   PHYSICIAN:  Jerolyn Shin C. Katrinka Hall, M.D.   DATE OF BIRTH:  1930/10/17   DATE OF ADMISSION:  DATE OF DISCHARGE:  LH                                HISTORY & PHYSICAL   HISTORY OF PRESENT ILLNESS:  This is a 77 year old male with a history of  recurrent severe right-sided abdominal pain without nausea or vomiting.  The  pain flared up in June 2006.  The pain was in his right flank.  It moved to  his right upper quadrant.  He has not had nausea or vomiting.  He was  treated with antispasmodics and narcotics without improvement.  CT of the  abdomen was unremarkable.  He is status post cholecystectomy.  He has a  normal appendix with no evidence of abnormality of his kidneys.  The patient  is scheduled for upper and lower endoscopy for evaluation of chronic  abdominal pain.   PAST HISTORY:  1.  Hypertension.  2.  Diffuse osteoarthritis.  3.  Diabetes mellitus.  4.  Cardiac arrhythmia.  5.  Stroke.   MEDICATIONS:  1.  Lidoderm patch 12 hours per day.  2.  Pamine 5 mg three times a day.  3.  Lotrel 10/20 once a day.  4.  Hyzaar 100/25 once a day.  5.  Tylox 1-4 times a day.   SURGERY:  1.  Cholecystectomy.  2.  Hemorrhoidectomy.   PHYSICAL EXAMINATION:  VITAL SIGNS:  Blood pressure 134/72, pulse 62,  respirations 20, weight 202 pounds.  HEENT:  Unremarkable.  NECK:  Supple.  No JVD, bruit, adenopathy, or thyromegaly.  CHEST:  Clear to auscultation.  HEART:  Regular rate and rhythm without murmur, gallop, or rub.  ABDOMEN:  Soft, nontender.  No masses.  EXTREMITIES:  No clubbing, cyanosis, or edema.  NEUROLOGIC:  No focal motor, sensory, or cerebellar deficits.   IMPRESSION:  1.  Chronic right-sided abdominal pain.  2.  Hypertension.  3.  Osteoarthritis.  4.  Lumbar disk disease.  5.  Diabetes  mellitus.   PLAN:  Upper endoscopy and colonoscopy.      Tyrone Hall, M.D.  Electronically Signed     LCS/MEDQ  D:  11/02/2004  T:  11/03/2004  Job:  [id number]

## 2010-09-02 NOTE — Op Note (Signed)
NAME:  Tyrone Hall, Tyrone Hall                         ACCOUNT NO.:  [id number]   MEDICAL RECORD NO.:  [id number]                  PATIENT TYPE:   LOCATION:                                       FACILITY:   PHYSICIAN:  R. Roetta Sessions, M.D.              DATE OF BIRTH:   DATE OF PROCEDURE:  10/08/2003  DATE OF DISCHARGE:                                 OPERATIVE REPORT   PROCEDURE:  Endoscopic retrograde cholangiopancreatography (ERCP) with  pancreatic duct and biliary stent removal, cholangiogram with delivery of  common duct stone.   INDICATIONS FOR PROCEDURE:  The patient is a 77 year old gentleman who  presented nearly 1 month ago at [facility] with biliary  pancreatitis.  He was found to have cholelithiasis and suspicious of common  duct stone on ultrasound.  His LFTs were elevated.  He ultimately underwent  placement of a biliary and both pancreatic stent.  He underwent a  sphincterotomy and removal of a common duct stone.  He did have viscus  sludge material in his bile duct for which a biliary stent was left behind.  He subsequently underwent a laparoscopic cholecystectomy by Dr. Franky Macho.  He has clinically done very well.  He has had a little bit of back  pain since the procedure, but his appetite is really good and has not had  any significant abdominal pain.  His LFTs and amylase are normal today.  He  comes for both biliary and pancreatic duct stent removal.  Recent KUB  demonstrated persistence of the pancreatic stent.   The approach of ERCP with stent removal, cholangiogram, removal of stones  remaining was discussed, in detail, with the patient.  The postential risk  of the procedure including, but not limited to, a 1:10 chance of  pancreatitis, perforation, reaction to medications, and bleeding were  discussed.  All questions were answered.  All parties were agreeable.  Please see my handwritten H&P and my updated H&P.   DESCRIPTION OF PROCEDURE:  O2  saturation, blood pressure, pulse, and  respirations were monitored throughout the entirety of the procedure.   CONSCIOUS SEDATION:  Versed 3 mg IV, Demerol 75 mg IV in divided doses.  Cetacaine spray for topical oropharyngeal anesthesia. Levaquin 250 mg IV  prior to procedure.   INSTRUMENT:  Olympus, video chip system.   DESCRIPTION OF PROCEDURE/FINDINGS:  The patient was placed in the semiprone  position, using the Olympus therapeutic duodenoscope the esophagus was  intubated easily.  A cursory examination of the distal esophagus and stomach  revealed no abnormalities.   Examination of the bulb and second portion revealed the previously placed  indwelling stents side-by-side, 1 pancreatic and 1 biliary.  Please see  photos.  The scope was pulled back to the short position 35 cm from the  incisors. Using the snare, both stents were grasped simultaneously and  withdrawn with the scope.  They were covered intact.  Subsequently the scope  was reintroduced into the duodenum.  Using the Microvasive sphincterotome a  deep cannulation of the biliary tree was easily obtained.  A cholangiogram  was performed.  There appeared to be slight dilation of the entire biliary  tree status post cholecystectomy.  There is a questionable 2-3 mm filling  defect.  Subsequently the sphincterotome was removed while keeping a safety  wire deep in the biliary tree over which a graduated balloon was railed.  It  was inflated to three-fourths which fit the common hepatic duct.  A balloon  occlusion cholangiogram was subsequently performed with the first passed a  0.5 cm yellow, cholesterol-type stone delivered through the ampullary  orifice; please see photos. Subsequently, a second pass with the balloon  yielded no residual material.  There was flow of bile and bubbles through  the ampullary orifice after this maneuver was done.  There was still some  residual contrast in the biliary tree following this  maneuver, 5 minutes  layer.  However, it was felt that the biliary tree was clear.  Dr. Lyman Bishop  was present for a good portion of the fluoroscopy and agrees with the above  assessment.   A 50-minute delay film will be obtained.  The patient tolerated the  procedure well.   IMPRESSION:  1. Status post removal of the previously placed indwelling pancreatic and     biliary stent.  2. Cholangiogram revealed a modestly dilated biliary tree and a common duct     stone, status post removal of common duct stone with balloon occlusion     cholangiogram.  No residual filling defects seen.  3. Delayed drainage film is pending.  4. The pancreatic duct was not injected.   RECOMMENDATIONS:  1. A clear liquid diet this evening; may advance diet, as tolerated     tomorrow.  2. No further intervention is warranted as long as the patient does not have     any future problems.  3. He is to call me if he has any abdominal pain; fever or chills; nausea or     vomiting; etcetera.      ___________________________________________                                            Jonathon Bellows, M.D.   RMR/MEDQ  D:  10/08/2003  T:  10/08/2003  Job:  [id number]   cc:   Patrica Duel, M.D.  [address]  [place]  [place] [address]  Fax: [phone]   Dalia Heading, M.D.  [address], [place]  [place] [address]  Fax: [phone]   R. Roetta Sessions, M.D.  [address]  [place]  [place] [address]  Fax: [phone]

## 2010-09-02 NOTE — Group Therapy Note (Signed)
Tyrone Hall, Tyrone Hall                ACCOUNT NO.:  [id number]   MEDICAL RECORD NO.:  [id number]          PATIENT TYPE:  INP   LOCATION:  A211                          FACILITY:  [facility]   PHYSICIAN:  Margaretmary Dys, M.D.DATE OF BIRTH:  [birth year]   DATE OF PROCEDURE:  DATE OF DISCHARGE:                                   PROGRESS NOTE   SUBJECTIVE:  Patient is stable.  He feels less lightheaded today.  He has no  chest pain or shortness of breath and has no dizziness.   He is a diabetic who is very poorly controlled and was admitted with blood  sugar greater than 500 yesterday.  Overall, the patient has received IV  fluids and insulin with improvement in his blood sugars.   OBJECTIVE:  GENERAL:  Conscious, alert, comfortable.  Not in acute distress,  well oriented to time, place, and person.  VITAL SIGNS:  His blood pressure is 130/70, pulse 68, respirations 16.  T  max 98.5.  Blood sugars have now ranged between 167-215.  HEENT:  Normocephalic and atraumatic.  Oral mucosa is moist, pink.  No  exudates.  NECK:  Supple.  No JVD.  No lymphadenopathy.  LUNGS:  Clear clinically.  Good air entry bilaterally.  HEART:  S1 and S2.  Regular.  No S3, S4, gallops, or rubs.  ABDOMEN:  Soft, nontender.  Bowel sounds positive.  EXTREMITIES:  No edema was noted.   LABORATORY/DIAGNOSTIC DATA:  His white blood cell count is 8.4, hemoglobin  12.4, hematocrit 35, platelet count 155, neutrophils 32%, lymphocytes 59%.  Sodium 138, potassium 3.2, chloride 104, CO2 29, glucose 119, BUN 14,  creatinine 1.1.  Cardiac enzymes were negative.   ASSESSMENT/PLAN:  1.  Tyrone Hall is a 77 year old African-American male admitted with poorly      controlled diabetes and severe hyperglycemia.  The patient was started      on a Glucomander and received some IV fluids.  His blood sugars have      significantly improved.  He is now on a step-down protocol with Lantus      insulin.  Patient does not recall the name of  his oral hypoglycemic      agent.  We will check a HBA1C on him and see what his blood sugar      controls have been like.  Patient did inform me that his wife is a      diabetic who takes injections twice daily.  I have informed him that      depending on what his HBA1C is, he may also need to start that, but we      could give him the benefit by restarting on his oral hypoglycemic agent      and adding another agent to it, but it seems his      predominant issue is poor compliance.  Overall, he is stable.  Will      continue to follow the glucomander protocol.  2.  Hypertension:  Blood pressure is in satisfactory range.      Margaretmary Dys, M.D.  Electronically  Signed     AM/MEDQ  D:  11/19/2005  T:  11/19/2005  Job:  [id number]

## 2010-09-02 NOTE — H&P (Signed)
Tyrone Hall, Tyrone Hall                          ACCOUNT NO.:  [id number]   MEDICAL RECORD NO.:  [id number]                   PATIENT TYPE:  OUT   LOCATION:  RAD                                  FACILITY:  [facility]   PHYSICIAN:  Dalia Heading, M.D.               DATE OF BIRTH:  May 20, 1930   DATE OF ADMISSION:  07/02/2003  DATE OF DISCHARGE:  07/02/2003                                HISTORY & PHYSICAL   CHIEF COMPLAINT:  Left-sided abdominal pain.   HISTORY OF PRESENT ILLNESS  The patient is a 77 year old black male who is referred for endoscopic  evaluation.  He needs a colonoscopy due to a history of left-sided abdominal  pain.  CT scan of the abdomen is negative.  No weight loss, nausea,  vomiting, diarrhea, constipation, melena or hematochezia had been noted.  He  has never had a colonoscopy.  There is no family history of colon carcinoma.   PAST MEDICAL HISTORY:  Includes hypertension, noninsulin dependent diabetes  mellitus.   PAST SURGICAL HISTORY:  Hemorrhoidectomy.   CURRENT MEDICATIONS:  1. A blood-pressure pill.  2. Glucophage.  3. Pain pill.  4. Ciprofloxacin for sinusitis.   ALLERGIES:  No known drug allergies.   REVIEW OF SYSTEMS:  Noncontributory.   PHYSICAL EXAMINATION:  GENERAL:  The patient is a well-developed, well-  nourished black male, in no acute distress.  VITAL SIGNS:  He is afebrile.  Vital signs are stable.  LUNGS:  Clear to auscultation with equal breath sounds bilaterally.  HEART:  Examination reveals a regular rate and rhythm without S3, S4 or  murmurs.  ABDOMEN:  Soft, nontender, nondistended.  No hepatosplenomegaly or masses  are noted.  RECTAL:  Examination was deferred to the procedure.   IMPRESSION:  Left-sided abdominal pain.   PLAN:  The patient is scheduled for a colonoscopy on July 14, 2003.  The  risks and benefits of the procedure, including bleeding and perforation were  fully explained to the patient who gave informed  consent.     ___________________________________________                                         Dalia Heading, M.D.   MAJ/MEDQ  D:  07/07/2003  T:  07/07/2003  Job:  [id number]   cc:   Patrica Duel, M.D.  [address]  [place]  [place] [address]  Fax: [phone]

## 2010-09-02 NOTE — Op Note (Signed)
NAME:  Tyrone Hall, Tyrone Hall                         ACCOUNT NO.:  [id number]   MEDICAL RECORD NO.:  [id number]                  PATIENT TYPE:   LOCATION:                                       FACILITY:   PHYSICIAN:  R. Roetta Sessions, M.D.              DATE OF BIRTH:   DATE OF PROCEDURE:  09/10/2003  DATE OF DISCHARGE:                                 OPERATIVE REPORT   PROCEDURE:  Endoscopic retrograde cholangiopancreatography (ERCP) with  sphincterotomy and biliary stent placement.   INDICATIONS FOR PROCEDURE:  The patient is a 77 year old gentleman admitted  to the hospital with biliary pancreatitis.  He underwent an  ERCP yesterday.  Cannulation of the biliary tree was difficult; and, in  fact, not achieved deeply; was suspected to have a common duct stone.  The  pancreatic duct was cannulated; a 5 [place] pancreatic duct stent was placed.  He has been stable overnight.  His amylase and lipase are coming down. His  LFTs are mildly elevated, but stable.   Submucosal injection precluded further attempts at achieving deep biliary  cannulation yesterday.  He now returns for attempt at achieving deep biliary  cannulation, sphincterotomy, and stone extraction as appropriate.  This  approach has been discussed with the patient, the patient's wife yesterday,  and again today.  Again, the risk of pancreatitis, perforation, and reaction  to medications were again explained.  All parties were agreeable.  He has  been on IV Unasyn.   PROCEDURE NOTE:  O2 saturation, blood pressure, pulse and respirations were  monitored throughout the entire procedure.   CONSCIOUS SEDATION:  IV Versed and Demerol doses.  Cetacaine spray for  topical oropharyngeal anesthesia.   INSTRUMENT:  Olympus video chip system.   FINDINGS:  A cursory examination of the distal esophagus, stomach, and  duodenum revealed only a pancreatic duct stent protruding from the ampullary  orifice as previously left yesterday.  The  scope was pulled back from the  short position 55 cm from the incisors.   Again, the ampullary orifice was inspected.  It was very small and was  accommodating only the pancreatic duct stent.  I did approach it with the  Microvasive sphincterotome; was not able to get around it even with a 0.035  guidewire and there was some redundant duodenal folds which tented the  ampulla making matters more challenging.  I was able to tease back the  mucosa enough with the needle knife to unroof the biliary sphincter over the  pancreatic duct stent to make room for the Microvasive papillotome.  Shortly  thereafter I was able to gain deep cannulation into the biliary tree with  guidewire palpation.  Cholangiogram was obtained.  The biliary tree was  mildly dilated.  There were no strictures.  There was black bile coming out  of the biliary sphincter as I was manipulating it.  There was a 3 mm filling  defect in  the distal bile duct.  I advanced the safety wire deep into the  biliary tree, pulled back the sphincterotome to the 12 o'clock position and  using the Erbie unit extended the sphincterotomy.  I was somewhat limited in  my ability to extend the sphincterotomy because of the floppy duodenal folds  that kept getting in the way.  As I was doing this maneuver, the  sphincterotome wire broke.  This was the only one we had left in her  inventory. I elected, at this point in time, to go ahead and secure adequate  biliary drainage with a 10 [place], 5-cm, plastic stent.  The sphincterotome  was removed over the wire and a 10 [place], 5-cm stent was railed over in  satisfactory position.  At the conclusion of the study there was excellent  biliary drainage.   The patient tolerated the procedure well.  The films were reviewed  afterwards with Dr. Lorin Picket and they are consist with the above report.   IMPRESSION:  1. Choledocholithiasis status post sphincterotomy.  Status post placement of     a biliary  plastic endoprosthesis as described above.  2. Previously placed pancreatic duct stent in good position.   RECOMMENDATIONS:  1. Continue IV Unasyn.  2. Clear liquid diet.  3. Check amylase, lipase, and LFTs tomorrow morning.  4. Patient ought to have a surgical consultation for elective     cholecystectomy.  5. Will check a KUB to see if the pancreatitis stent remains in 2 weeks. If     it does, we will need to remove it.  Otherwise we will plan to bring this     gentleman back electively to remove the biliary stent and stretch his     bile duct in 4-6 weeks.  6. Dr. Karilyn Cota will be seeing this nicely gentleman, in my absence, on     09/11/2003.  7. I have discussed the findings and recommendations with Dr. Karleen Hampshire via telephone.      ___________________________________________                                            Jonathon Bellows, M.D.   RMR/MEDQ  D:  09/10/2003  T:  09/10/2003  Job:  [id number]   cc:   Patrica Duel, M.D.  [address], [address]  [place]  [place] [address]  Fax: [phone]   R. Roetta Sessions, M.D.  [address]  [place]  [place] [address]  Fax: [phone]

## 2010-09-02 NOTE — Procedures (Signed)
NAMEMENDY, LAPINSKY                ACCOUNT NO.:  [id number]   MEDICAL RECORD NO.:  [id number]          PATIENT TYPE:  OUT   LOCATION:  RAD                           FACILITY:  [facility]   PHYSICIAN:  Gerrit Friends. Dietrich Pates, MD, FACCDATE OF BIRTH:  [birth year]   DATE OF PROCEDURE:  01/01/2006  DATE OF DISCHARGE:                                  ECHOCARDIOGRAM   REFERRING PHYSICIANS:  Milus Mallick. Lodema Hong, M.D. and Gerrit Friends. Dietrich Pates, MD,  [facility]   CLINICAL DATA:  A 77 year old gentleman with EKG abnormalities.   M-MODE:  Aorta 3.4, left atrium 4.3, septum 1.9, posterior wall 1.4, LV  diastole 4.3, LV systole 2.8.   IMPRESSION:  1. Technically adequate echocardiographic study.  2. Left atrial size at the upper limit of normal; normal right atrium and      right ventricle.  3. Trileaflet aortic valve with moderate sclerosis but no significant      stenosis.  4. Normal mitral, tricuspid and pulmonic valves; normal proximal pulmonary      artery.  5. Normal left ventricular size; moderate hypertrophy with asymmetric      septal hypertrophy.  There is a small area of akinesis at the base of      the inferior wall.  Overall LV systolic function is normal.  6. Normal IVC.      Gerrit Friends. Dietrich Pates, MD, [facility]  Electronically Signed     RMR/MEDQ  D:  01/01/2006  T:  01/02/2006  Job:  [id number]

## 2010-09-02 NOTE — H&P (Signed)
NAME:  Tyrone Hall, Tyrone Hall                          ACCOUNT NO.:  [id number]   MEDICAL RECORD NO.:  [id number]                   PATIENT TYPE:  INP   LOCATION:  A207                                 FACILITY:  [facility]   PHYSICIAN:  Patrica Duel, M.D.                 DATE OF BIRTH:  17-Jul-1930   DATE OF ADMISSION:  09/08/2003  DATE OF DISCHARGE:                                HISTORY & PHYSICAL   CHIEF COMPLAINT:  Abdominal pain, nausea and vomiting.   HISTORY OF PRESENT ILLNESS:  This is a 77 year old African-American male  with a longstanding history of hypertension and poorly controlled diabetes.  He has been somewhat noncompliant over time in regards to his chronic  problems.   The patient presented to the office approximately two months ago complaining  of abdominal pain.  His symptom complex was compatible with diverticulitis.  He was treated empirically with Cipro as well as Flagyl.  A CT scan was  obtained which was essentially unremarkable.  He responded to therapy  somewhat.  His pain has persisted.  He has seen Dr. Sherwood Gambler, who felt that his  pain may be radicular in nature.  An MRI of the lumbosacral spine revealed  some foraminal stenosis at L4-5 bilaterally.  Thoracic spine MRI negative.   The patient's symptoms have been treated with pain medications.   The patient presented to the hospital ER today complaining of acute onset of  increasingly severe epigastric and left lower quadrant abdominal pain as  well as nausea and vomiting.  He denied melena, hematemesis, hematochezia,  fever, or chills.   The patient's emergency room workup included a CBC which revealed a mild  leukocytosis with a white count of 13,000, markedly elevated amylase and  lipase (4810/6194 respectively).  He also had mild elevation of his liver  functions with a total bilirubin of 2 and transaminasemia with an AST of 175  and ALT of 141.   A STAT ultrasound has been obtained, and the results currently  are  unavailable.  This will be reviewed.  The patient's blood pressure was noted  to be elevated in the emergency department at 192/99.  He was treated with  IV Lopressor with good response.  He was also treated symptomatically with  Dilaudid, and his pain is much improved.  There is no history of headache,  neurologic deficits, chest pain, shortness of breath, cough, hemoptysis, or  other symptoms.  His bowel habits have not changed.   PAST MEDICAL HISTORY:  As noted above.   ALLERGIES:  None known.   CURRENT MEDICATIONS:  Include Lotrel 5/20 1 q.d., Amaryl 4 q.d., Tylox  p.r.n.   SOCIAL HISTORY:  Nonsmoker and nondrinker.   FAMILY HISTORY:  Noncontributory.   REVIEW OF SYSTEMS:  Negative except as mentioned.   PHYSICAL EXAMINATION:  VITAL SIGNS:  Currently, blood pressure 150/80.  He  is afebrile.  Heart rate  is 80 and regular.  Respirations are 16 and  unlabored.  GENERAL:  A very pleasant, fully alert male who appears to be in moderate  distress only.  HEENT:  Normocephalic and atraumatic.  There is no scleral icterus.  Ears  and nose are benign.  NECK:  Supple.  LUNGS:  Clear.  ABDOMEN:  Mild-to-moderately distended.  There is some tenderness in the  epigastrium as well as in the left lower quadrant.  There is some  hypertympany, but bowel sounds are essentially normal.  EXTREMITIES:  No clubbing, cyanosis or edema.  NEUROLOGIC:  Within normal limits.   ADDITIONAL LABS:  Ultrasound is currently available, which reveals small  gallstones and thickened gallbladder wall.  Probable distal common bile duct  calculi and dilated common bile duct of 11 mm.  This is compatible with  acute cholecystitis and common bile duct obstruction.   ASSESSMENT:  A 77 year old hypertensive diabetic with apparent common bile  duct obstruction just diagnosed with a STAT ultrasound.   PLAN:  He will be n.p.o.  Administer empiric broad-spectrum antibiotics and  surgical consult as well as GI  will see the patient.     ___________________________________________                                         Patrica Duel, M.D.   MC/MEDQ  D:  09/08/2003  T:  09/08/2003  Job:  [id number]

## 2010-09-02 NOTE — Discharge Summary (Signed)
NAMECAROLOS, Tyrone Hall                ACCOUNT NO.:  [id number]   MEDICAL RECORD NO.:  [id number]          PATIENT TYPE:  INP   LOCATION:  A211                          FACILITY:  [facility]   PHYSICIAN:  Osvaldo Shipper, MD     DATE OF BIRTH:  04/07/1931   DATE OF ADMISSION:  11/18/2005  DATE OF DISCHARGE:  08/07/2007LH                                 DISCHARGE SUMMARY   The patient used to be a patient of Dr. Adaline Sill and [facility]; however, he now  has an appointment to see Dr. Margo Aye.   Please review H&P dictated by Dr. Catalina Pizza for details regarding the patient's  presenting illness.   DISCHARGE DIAGNOSES:  1. Nonketotic hyperosmolar state.  2. Diabetes now requiring insulin.  3. Abdominal EKG requiring outpatient follow up.  4. Hypertension requiring follow up.   BRIEF HOSPITAL COURSE:  #1 - NONKETOTIC HYPEROSMOLAR STATE:  Briefly, this  is a 77 year old African-American male, who presented to the ED feeling  weak, tired, dizzy, was found to have a blood sugar of 515.  He was  nonketotic.  The patient was put on an insulin drip initially which was  changed over to Lantus insulin.  The patient is a known type 2 diabetic;  however, he was on oral hypoglycemic agents.  He apparently did not have  good control of his blood sugars with just oral medications.  With the  insulin drip and subsequently the Lantus, his blood sugars were better.  Blood sugar on discharge is not completely optimal.  It is about 250;  however, I think this can be further managed at home by the patient from his  PMD.  HBA1C was 15.9. The patient was also seen by a nutritionist, given  information on diabetic diets.  He was also given extensive and  comprehensive diabetic education.  He was also taught how to administer  insulin to himself and how to check finger-stick glucose so that he could  treat himself appropriately. A UA was checked and did not show any  proteinuria.   #2 - ABNORMAL EKG:  The patient was found to  have some nonspecific T-wave  inversions on his EKG in his lateral leads and his inferior leads.  There  was no old EKG to compare.  The patient underwent serial cardiac enzymes  which he ruled out for acute coronary syndrome.  He did not have any chest  pain to begin with.  I think because of his diabetes, the patient is at risk  for coronary artery disease; hence, we recommended outpatient cardiac  evaluation in the form of a stress test for this patient.  He will be set up  to see [facility] in 4-6 weeks.  In the meantime, he will be asked  to take baby aspirin.   #3 - HYPERTENSION:  Relative high blood pressures were noted at the time of  discharge which were slowly controlled with antihypertensive agents.  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   His lipid profile will also need to be checked which was not done during  this hospital stay.   On the day of discharge, the patient was feeling quite well.  He was  tolerating p.o. intake. Except for his glucose all of his other blood work  was all stable, but blood glucose was mildly elevated about 224.  CBGs were  250.  I discussed the case with the patient and his wife, and I told him  that I will be adjusting his Lantus further; however, I told him that he may  continue outpatient management from this point on.   DISCHARGE MEDICATIONS:  1. Lantus insulin 20 units subcu daily to be taken at 10 a.m.  2. NovoLog insulin on a sliding scale which has been recommended to him.  3. Lotrel 10/40 mg 1 tab daily.  4. Enteric-coated aspirin 81 mg daily.  5. HCTZ 25 mg daily.  6. Metformin 5 mg b.i.d.   The patient was told that it might be possible in the future if his blood  sugars are controlled with oral hypoglycemic agents, that we may be able to  take him off of the insulin.   FOLLOW UP:   1. The patient has an appointment to see Dr. Catalina Pizza on November 22, 2005.  2. With [facility] in 4-6 weeks.  I do not have an exact date for      this appointment at this time.   DIET:  Modified carbohydrate diet, medium.   PHYSICAL ACTIVITY:  No restrictions.   CONSULTATIONS:  No consultations are obtained during this admission.   Imaging studies include a chest x-ray which did not show any acute findings.   Other recommendations include obtaining a fasting lipid profile.      Osvaldo Shipper, MD  Electronically Signed     GK/MEDQ  D:  11/21/2005  T:  11/21/2005  Job:  [id number]   cc:   Catalina Pizza, M.D.  Fax: [phone]

## 2010-09-02 NOTE — Op Note (Signed)
NAME:  Tyrone Hall, Tyrone Hall                          ACCOUNT NO.:  [id number]   MEDICAL RECORD NO.:  [id number]                   PATIENT TYPE:  INP   LOCATION:  A207                                 FACILITY:  [facility]   PHYSICIAN:  Dalia Heading, M.D.               DATE OF BIRTH:  08-Dec-1930   DATE OF PROCEDURE:  09/14/2003  DATE OF DISCHARGE:                                 OPERATIVE REPORT   AGE:  77 years old.   PREOPERATIVE DIAGNOSES:  Cholecystitis, cholelithiasis, pancreatitis.   POSTOPERATIVE DIAGNOSES:  Cholecystitis, cholelithiasis, pancreatitis.   PROCEDURE:  Laparoscopic cholecystectomy.   SURGEON:  Dalia Heading, M.D.   ANESTHESIA:  General endotracheal.   INDICATIONS:  The patient is a 77 year old black male who presents with  cholecystitis secondary to cholelithiasis.  He also has a history of  pancreatitis, status post endoscopic retrograde cholangiopancreatography  with a biliary stent placement.  The risks and benefits of the procedure  including bleeding, infection, hepatobiliary injury, and the possibility of  an open procedure were fully explained to the patient, gaining informed  consent.   PROCEDURE NOTE:  The patient was placed in the supine position.  After  induction of general endotracheal anesthesia, the abdomen was prepped and  draped using the usual sterile technique with Betadine.  The surgical site  confirmation was performed.   A supraumbilical incision was made down to the fascia.  A Veress needle was  introduced into the abdominal cavity and confirmation of placement was done  using the saline drop test.  The abdomen was then insufflated to 16 mmHg  pressure.  An 11 mm trocar was introduced into the abdominal cavity under  direct visualization without difficulty.  The patient was placed in the  reversed Trendelenburg position and an additional 11 mm trocar was placed in  the epigastric region and 5 mm trocars were placed in the right upper  quadrant and the right flank regions.  The liver was inspected and noted to  be within normal limits.  The gallbladder was retracted superiorly and  laterally.  The dissection was begun around the infundibulum of the  gallbladder.  The cystic duct was first identified.  Its juncture to the  infundibulum fully identified.  Two clips were placed proximally and  distally on the cystic duct and the cystic duct was divided.  This was  likewise done of the cystic artery.  The gallbladder was then freed away  from the gallbladder fossa using Bovie electrocautery.  The gallbladder was  delivered through the epigastric trocar site using an endo-catch bag.  The  gallbladder fossa was inspected and no abnormal bleeding or bile leakage was  noted.  Surgicel was placed on the gallbladder fossa.  All fluid and air  were then evacuated from the abdominal cavity prior to removal of the  trocars.   All wounds were irrigated with normal saline.  All  wounds were injected with  0.5% Sensorcaine.  The supraumbilical fascia was reapproximated using an 0  Vicryl interrupted suture.  All skin incisions were closed using a 4-0  Vicryl subcuticular suture.  Steri-Strips and a dry sterile dressing were  applied.   All instrument and needle counts were correct at the end of the procedure.  The patient was extubated in the operating room and went back to the  recovery room awake and in stable condition.   COMPLICATIONS:  None.   SPECIMENS:  Gallbladder with stones.   ESTIMATED BLOOD LOSS:  Minimal.      ___________________________________________                                            Dalia Heading, M.D.   MAJ/MEDQ  D:  09/14/2003  T:  09/14/2003  Job:  [id number]   cc:   Davonna Belling, M.D.   Patrica Duel, M.D.  [address], [address]  [place]  [place] [address]  Fax: [phone]

## 2010-09-02 NOTE — Discharge Summary (Signed)
NAME:  Tyrone Hall, Tyrone Hall                          ACCOUNT NO.:  [id number]   MEDICAL RECORD NO.:  [id number]                   PATIENT TYPE:  INP   LOCATION:  A207                                 FACILITY:  [facility]   PHYSICIAN:  Dalia Heading, M.D.               DATE OF BIRTH:  Feb 11, 1931   DATE OF ADMISSION:  09/08/2003  DATE OF DISCHARGE:  09/17/2003                                 DISCHARGE SUMMARY   HISTORY OF PRESENT ILLNESS AND HOSPITAL COURSE:  The patient is a 77-year-  old black male who presented to the hospital for workup of pancreatitis.  A  gastroenterology consultation was obtained, and it was felt that the patient  suffered from acute cholecystitis secondary to choledocholithiasis and  gallstone pancreatitis.  He initially underwent an ERCP with pancreatic  stent placement by Dr. Jena Gauss on 09/09/2003.  He was unable to get into the  common bile duct.  He subsequently underwent an ERCP with intrabiliary stent  placement on 09/10/2003.  His amylase and lipase resolved slowly afterwards.  A surgery consultation was obtained, and it was felt that the patient did  indeed need a laparoscopic cholecystectomy once his pancreatitis had  resolved.  He was taken to the operating room on 09/14/2003 and underwent a  laparoscopic cholecystectomy.  His postoperative course was remarkable only  for constipation and hypokalemia.  Both of these were treated.  The patient  is being discharged home on 09/17/2003 in good and improved condition.   DISCHARGE INSTRUCTIONS:  The patient is to follow up with Dr. Franky Macho  on 10/01/2003, [facility] in two weeks.  He is to get a plain film of  the abdomen on 09/24/2003, with the report sent to Dr. Karilyn Cota.   DISCHARGE MEDICATIONS:  1. Lotrel.  2. Amaryl.  3. Vicodin.  4. MiraLax.   PRINCIPAL DIAGNOSES:  1. Gallstone pancreatitis.  2. Cholelithiasis.  3. Non-insulin-dependent diabetes mellitus.  4. Hypokalemia.   PRINCIPAL PROCEDURES:  1. ERCP  with pancreatic stent placement on 09/09/2003.  2. ERCP with sphincterotomy and intrabiliary stent placement on 09/10/2003.  3. Laparoscopic cholecystectomy on 09/14/2003.     ___________________________________________                                         Dalia Heading, M.D.   MAJ/MEDQ  D:  09/17/2003  T:  09/17/2003  Job:  [id number]   cc:   Patrica Duel, M.D.  [address]  [place]  [place] [address]  Fax: [phone]   R. Roetta Sessions, M.D.  [address]  [place]  [place] [address]  Fax: [phone]

## 2010-09-02 NOTE — Op Note (Signed)
NAME:  Tyrone Hall, Tyrone Hall                         ACCOUNT NO.:  [id number]   MEDICAL RECORD NO.:  [id number]                  PATIENT TYPE:   LOCATION:                                       FACILITY:   PHYSICIAN:  R. Roetta Sessions, M.D.              DATE OF BIRTH:   DATE OF PROCEDURE:  09/09/2003  DATE OF DISCHARGE:                                 OPERATIVE REPORT   PROCEDURE:  Endoscopic retrograde cholangiopancreatography (ERCP) with  pancreatic duct stent placement.   INDICATIONS FOR PROCEDURE:  The patient is a 77 year old gentleman admitted  to the hospital with biliary pancreatitis.  Ultrasound suggests a common  duct stone, distal common bile duct.  LFTs are mildly elevated.  From a  pancreatitis standpoint he is much better on the day of the procedure.  ERCP  with potential sphincterotomy with biliary decompression, stone removal, and  possible pancreatic duct stent placement fully discussed with the patient,  the patient's wife and family.  The potential risks for exacerbating  pancreatitis, reaction to medications, and bleeding all discussed in some  detail.  All questions were answered.  All parties were agreeable.  The  patient is already on IV Unasyn.   PROCEDURE NOTE:  The patient was placed in the semiprone position on the  fluoroscopic table in the radiology department.   CONSCIOUS SEDATION:  IV Versed and Demerol in incremental doses.  Cetacaine  spray for topical oropharyngeal anesthesia.   INSTRUMENT:  Olympus, video chip system.   DESCRIPTION OF PROCEDURE/FINDINGS:  Cursory examination of the distal  esophagus, stomach, and duodenum revealed no abnormalities. The scope was  pulled back in the short position 55 cm from the incisors.  A scout film was  taken. It is notable the ampulla was relatively small and I could see dark  bile intermittently coming out of a tiny, less than 1 mm orifice.  Using the  Microvasive sphincterotome the ampulla was impacted.  Using  the 035  guidewire I attempted to palpate for the bile duct.  Biliary cannulation was  difficult.  Even using a 0.08 guidewire I was unable to achieve a deep  cannulation although a couple of injections were made and I did get a  partial cholangiogram.  There is a possible meniscus at the level of the  distal common bile duct.  I made several attempts at deep biliary  cannulation, finally for the first time the pancreatic duct was cannulated  deeply with the wire.  I elected to go ahead and put a pancreatic duct  stent, railed a 5 [place], 3 cm pancreatic duct stent down into good position  across the ampullary orifice.  I subsequently went back and attempted the  biliary cannulation.  Unfortunately submucosal injection was produced really  precluding further attempts at deep biliary cannulation.  I elected to  terminate the procedure. The patient tolerated the procedure very well and  was reacted in  the x-ray unit.   IMPRESSION:  Normal appearing pancreatic duct status post pancreatic duct  stent placement.  Partial cholangiogram obtained possible meniscus  indicating a common duct stone, distal duct held [place] off from achieving a  deep biliary cannulation.  Submucosal injection also made the exam more  difficult.   RECOMMENDATIONS:  1. Clear liquid diet overnight.  Continue IV Unasyn.  2. Bring the patient back 09/10/2003 to focus on biliary cannulation,     hopefully this will be achieved now that the pancreatic duct stent is in     place.  3. Check labs on 09/10/2003.  4. Further recommendations to follow.      ___________________________________________                                            Jonathon Bellows, M.D.   RMR/MEDQ  D:  09/10/2003  T:  09/10/2003  Job:  [id number]   cc:   Patrica Duel, M.D.  [address]  [place]  [place] [address]  Fax: [phone]   R. Roetta Sessions, M.D.  [address]  [place]  [place] [address]  Fax: [phone]

## 2010-09-02 NOTE — Consult Note (Signed)
NAME:  Tyrone Hall, JOINES                          ACCOUNT NO.:  [id number]   MEDICAL RECORD NO.:  [id number]                   PATIENT TYPE:  INP   LOCATION:  A207                                 FACILITY:  [facility]   PHYSICIAN:  R. Roetta Sessions, M.D.              DATE OF BIRTH:  09-13-1930   DATE OF CONSULTATION:  09/08/2003  DATE OF DISCHARGE:                                   CONSULTATION   HISTORY OF PRESENT ILLNESS:  The patient is a 77 year old African-American  male admitted through the emergency room for acute onset of increasingly  severe epigastric and left lower quadrant abdominal pain, as well as nausea  and vomiting.  The patient states he has had two months of increasingly  severe abdominal pain prior to this episode.  The patient further states he  had a colonoscopy two to three weeks ago with Dr. Lovell Sheehan which noted the  presence of sigmoid diverticulosis.  The patient states he has been  experiencing fevers, chills, fatigue, his appetite has been good, but in  spite of this, he has lost 15 pounds in the past two months.  The patient  had experienced some vomiting with this episode of pain.  He denies the  presence of reflux, hematemesis, or swallowing difficulties.  The patient  denies diarrhea, stating that he is almost chronically constipated in the  past few weeks.  He denies hematochezia, melena, the patient had a bowel  movement today, but felt that he did not completely empty his bowel.  He  describes the pain in his abdomen as being constant, diffuse, and at times  being a 10/10 in severity.  The patient states he has never had an EGD, and  as previously stated, did have a colonoscopy.  The patient denies any chest  pain, shortness of breath, swelling of the extremities, or urinary tract  symptoms.   CURRENT MEDICATIONS:  1. Unasyn 1.5 g IV q.6h.  2. Insulin.  3. Xanax 0.25 mg p.o.  4. Dilaudid 4 mg IV.  5. Maalox 30 mL p.r.n.  6. Phenergan 12.5 mg IV q.3h.  p.r.n.  7. Senokot one tablet p.o. q.h.s.  8. Ambien 10 mg p.o. q.h.s.   HOME MEDICATIONS:  1. Lotrel 5/20 one daily.  2. Amaryl 4 mg daily.  3. Tylox p.r.n.   PAST MEDICAL HISTORY:  1. Insulin-dependent diabetes.  2. Hypertension.  3. The patient states he did have a hemorrhoidectomy.   ALLERGIES:  No known drug allergies.   FAMILY HISTORY:  The patient's father died of an unknown type of cancer.  His mother died of an unknown type of cancer.  The patient has three sisters  living and in good health, one brother living with heart disease.  The  patient denies a history of colon cancer.   SOCIAL HISTORY:  The patient is married, he lives with his wife, he has two  children in  good health.  Occupation:  He is retired from the [employer].  The patient denies use of tobacco, alcohol, or street  drugs.   PHYSICAL EXAMINATION:  VITAL SIGNS:  Temperature 99, pulse 92, respirations  20, blood pressure 146/86, height 68 inches, weight 203 pounds.  HEENT:  The head is normocephalic, atraumatic.  Sclerae are clear and  nonicteric.  The conjunctivae are pale.  NECK:  No masses, lymphadenopathy, and is supple.  CARDIOVASCULAR:  Regular rate and rhythm without murmurs, rubs, or gallops.  RESPIRATORY:  Clear to auscultation bilaterally.  EXTREMITIES:  No cyanosis, clubbing, or edema.  SKIN:  Warm and dry.  ABDOMEN:  Distended, firm, with positive bowel sounds in all four quadrants.  The patient has moderate tenderness to palpation in both the left and right  lower quadrants with the left lower quadrant being more tender.  There is  some guarding.  No masses were palpated.  There is no hepatosplenomegaly.   LABORATORY DATA:  On Sep 08, 2003:  White count 11.3, hemoglobin 13.8,  hematocrit 39.1, MCV 78.7, platelets 185.  Total bilirubin 2, alkaline  phosphatase 1.8, SGOT 175, SGPT 141, total protein 7.2, albumin 3.7, calcium  9.  Amylase was 4810, today's value was 1450.   Lipase was 6194, today's  value was 1024.  Diagnostic imaging:  An ultrasound showed small gallstones  and thickened gallbladder wall, possible distal common bile duct calculi and  dilated common bile duct of 11 mm.  This is compatible with acute  cholecystitis and common bile duct obstruction.   IMPRESSION:  A 77 year old African-American male with a two month history of  diffuse abdominal pain presenting to the emergency room with severe acute  onset of abdominal pain.  The patient's amylase and lipase were markedly  elevated.  His liver function tests were elevated as well.  A stat  ultrasound indicates small gallbladder stones, a thickened gallbladder wall,  and a possible distal common bile duct stone, findings compatible with acute  cholecystitis and common bile duct obstruction.  Previous colonoscopy done  two to three weeks ago shows presence of sigmoid diverticulum.  The patient  should have an endoscopic retrograde cholangiopancreatography with  _______________and stone extraction.  This was discussed with Dr. Jena Gauss.   RECOMMENDATIONS:  Endoscopic retrograde cholangiopancreatography with  _________________and stone extraction done today.  Consent should be signed  and in chart.  Levaquin 250 mg IV on call for endoscopic retrograde  cholangiopancreatography.  Continue the patient's Unasyn 1.5 mg IV q.6h.  Order PT and bleeding time, as well as liver function tests stat.  More  recommendations to follow after procedure.  Thank you for allowing [place] to  share in the care of this patient.     ________________________________________  ___________________________________________  Ashok Pall, PA                           Jonathon Bellows, M.D.   GC/MEDQ  D:  09/09/2003  T:  09/09/2003  Job:  [id number]

## 2010-09-02 NOTE — Letter (Signed)
December 26, 2005     Milus Mallick. Lodema Hong, M.D.  [address]  [place], [place] [address]   RE:  Tyrone, Hall  MRN:  [id number]  /  DOB:  [birth year]   Dear Tyrone Hall,   It was my pleasure evaluating Tyrone Hall in consultation in the office today  at your request and as sent by Dr. Rito Ehrlich.  As you know, this nice  gentleman has had a history of mild diabetes but was recently admitted with  a rather severe exacerbation.  He responded well initially to insulin  therapy and then switched to oral medication.  Since hospital discharge, he  has felt generally well.  He was referred for cardiology assessment due to  EKG abnormalities and cardiovascular risk factors.  There is a history of  hypertension.  He has approximately a 50-pack year history of cigarette  smoking that was discontinued decades ago.  He is unaware of recent lipid  profiles, but a remote study in 2003 showed substantial hyperlipidemia.   PAST MEDICAL HISTORY:  Otherwise notable for DJD, an episode of pancreatitis  requiring hospitalization 2005, and remote hemorrhoidectomies.  He underwent  a cholecystectomy in 2005.   ALLERGIES:  The patient has no known allergies.   CURRENT MEDICATIONS:  Include  1. Olmesartan 40 mg q.d.  2. Lotrel 10/40 mg q.d.  3. Meloxicam 15 mg q.d.  4. Glipizide 5 mg b.i.d.  5. Metformin 1000 mg b.i.d.  6. Aspirin 81 mg q.d.  7. Oxycodone t.i.d.  8. Gabapentin 600 mg t.i.d.   SOCIAL HISTORY:  Retired from work in the [employer];  sedentary lifestyle; married with 2 children.   FAMILY HISTORY:  Notable for neoplastic disease; no prominent vascular  disease.   REVIEW OF SYSTEMS:  Positive for the need for corrective lenses, a prior  hemorrhage in the right eye with impaired visual acuity, bilateral  cataracts, upper and lower dentures, arthritic discomfort in the hands.  All  other systems reviewed and are negative.   PHYSICAL EXAMINATION:  GENERAL:   Pleasant gentleman in no acute distress.  VITAL SIGNS:  The weight is 215.  Blood pressure 150/70.  Heart rate 65 and  regular.  Respirations 16.  HEENT:  Grade 1-2 hypertensive changes on funduscopic exam; bilateral  cataracts; EOMs full.  NECK:  No jugular venous distention; normal carotid upstrokes without  bruits.  LUNGS:  Clear.  CARDIAC:  Normal first and second heart sounds; basilar systolic ejection  murmur.  ABDOMEN:  Soft and nontender; normal bowel sounds; no organomegaly.  EXTREMITIES:  1/2+ ankle edema; distal pulses intact.  NEUROMUSCULAR:  Symmetric strength and tone; normal cranial nerves.  MUSCULOSKELETAL:  No joint deformities.  SKIN:  No significant lesions.  ENDOCRINE:  No thyromegaly.  HEMATOPOIETIC:  No adenopathy.   EKG:  Normal sinus rhythm; borderline left atrial abnormality; delayed R-  wave progression - cannot exclude prior anteroseptal myocardial infarction;  left axis; nonspecific ST-T wave abnormalities in the inferior leads.  When  compared to a prior tracing of November 18, 2005, inferior T-wave abnormalities  have improved.   IMPRESSION:  Tyrone Hall is currently doing well with intensification of his  treatment for diabetes.  His EKG abnormalities in the setting of metabolic  derangements are probably not of major significance.  Since he does have a  possible prior myocardial infarction and a systolic murmur, an  echocardiogram will be obtained.  I do not think that stress testing is  necessary  at  the present time.  Diabetic control is being managed by Dr. Lodema Hong.  We  will change Benicar to Benicar HCT for better control of blood pressure.  Simvastatin will be started at a dose of 40 mg daily after a lipid profile  is obtained.  I will reassess this nice gentleman again in 2 months.   Sincerely,     Gerrit Friends. Dietrich Pates, MD, [facility]   RMR/MedQ  DD:  12/26/2005  DT:  12/27/2005  Job #:  [id number]

## 2010-09-02 NOTE — H&P (Signed)
NAMETAVONE, CAESAR                ACCOUNT NO.:  [id number]   MEDICAL RECORD NO.:  [id number]          PATIENT TYPE:  INP   LOCATION:  A211                          FACILITY:  [facility]   PHYSICIAN:  Toby L. Fugate, D.O.   DATE OF BIRTH:  [birth year]   DATE OF ADMISSION:  11/18/2005  DATE OF DISCHARGE:  LH                                HISTORY & PHYSICAL   PRIMARY CARE PHYSICIAN:  None.   HISTORY OF PRESENT ILLNESS:  Mr. Eichenberger is a 77 year old African-American  male who presents to the ED tonight after experiencing dizziness at home.  The patient did not lose consciousness at all.  He just felt somewhat  lightheaded.  EMS was called.  He was found to have a blood sugar greater  than 500.  Subsequently, he was transferred to [facility].  Here in  the ED, a blood sugar of 515 was confirmed.  By the time the patient had  gotten to the ED, the dizziness had resolved completely.  He says that he  has been taking all of his oral medications as prescribed.  I am not exactly  clear as to his regimen.  He cannot recall the names of the medications.  His wife confirms that he does take his medications daily.  She will bring  his meds in tomorrow.  He does not follow a diabetic diet.  He has cookies  and ice cream often.  He rarely, if ever, checks his blood sugar.  He denies  chest pain, shortness of breath, and diaphoresis.  There have been no fevers  or chills.   PAST MEDICAL/PAST SURGICAL HISTORY:  1. Hypertension.  2. Osteoarthritis.  3. Diabetes type 2.  4. Questionable cardiac arrhythmia in the past.  5. CVA.  6. Hemorrhoidectomy.   MEDICATIONS:  As above, the patient does not know his exact regimen.  He was  able to produce the names Lotrel and Mobic.  I did find on the computerized  chart that in the past, the patient has been on Amaryl and possibly  Metformin.  Again, the patient's wife will bring his medication vials  tomorrow.   ALLERGIES:  No known drug allergies.   SOCIAL HISTORY:  He denies tobacco, alcohol, or IV drug abuse.   FAMILY HISTORY:  Noncontributory.   REVIEW OF SYSTEMS:  A complete 12-point review of systems was negative.  The  review was negative except as stated in the HPI.   PHYSICAL EXAMINATION:  VITAL SIGNS:  Temperature 96.9, blood pressure  138/72, pulse 63, respiratory rate 20.  HEENT:  Pupils are equal, round and reactive to light.  Extraocular muscles  are intact.  No scleral icterus.  Oropharynx clear but dry.  NECK:  No JVD.  No carotid bruit.  No adenopathy.  LUNGS:  Clear to auscultation bilaterally.  No wheezes, rales, or rhonchi.  HEART:  Regular rate and rhythm.  No murmurs, rubs or gallops.  ABDOMEN:  Positive bowel sounds.  Nontender, nondistended.  EXTREMITIES:  No clubbing, cyanosis or edema.  NEUROLOGIC:  Cranial nerves II-XII are intact.  No  focal deficits.  DTRs are  2/4 in all extremities.  Strength 5/5 in all extremities.   LABS:  UA significant for greater than 100,000 mg/dl of glucose, 3-6 white  blood cells, and few bacteria.  White blood cell count 6.4, hemoglobin 12.4,  hematocrit 35, platelet count 158.  Sodium 129, potassium 3.4, chloride 97,  carbon dioxide 26, glucose 515, BUN 16, creatinine 1.3, calcium 8.   An EKG showed first-degree AV block, inverted T waves in II, III, aVF, and  inverted T waves in V4-6.  Unfortunately, there was no old EKG for  comparison.   ASSESSMENT/PLAN:  1. Hyperosmolar hyperglycemia:  I will admit the patient to a telemetry      bed.  I will start normal saline with potassium chloride.  The patient      will receive insulin via the Glucomander per protocol.  I will      reinitiate his oral diabetic meds in the morning once his blood sugar      is under better control.  I will request a chest x-ray.  I will monitor      his cardiac enzymes.  2. Inverted T waves on EKG:  The patient is currently chest painfree.  He      has no history of coronary artery disease.   There is no old EKG for      comparison.  I will cycle the patient's cardiac enzymes.  I will repeat      an EKG in the morning.  3. Hyperkalemia:  I will replace potassium.  4. Hyponatremia:  I will provide normal saline.  5. I will request the patient's stools be hemocculted.  I will order an      iron panel.  6. Hypertension:  The patient's blood pressure is under good control.  I      will continue him on Lotrel.  He may be on other antihypertensive      medications.  His wife will bring his medicine bottles tomorrow.  7. This patient is a full code.      Toby L. Fugate, D.O.  Electronically Signed     TLF/MEDQ  D:  11/19/2005  T:  11/19/2005  Job:  [id number]

## 2010-09-07 NOTE — Procedures (Unsigned)
CAROTID DUPLEX EXAM  INDICATION:  Follow up carotid artery disease.  HISTORY: Diabetes:  Yes. Cardiac:  No. Hypertension:  Yes. Smoking:  Quit. Previous Surgery:  No. CV History:  No. Amaurosis Fugax No, Paresthesias No, Hemiparesis No.                                      RIGHT             LEFT Brachial systolic pressure: Brachial Doppler waveforms: Vertebral direction of flow:        Antegrade         Antegrade DUPLEX VELOCITIES (cm/sec) CCA peak systolic                   32                41 ECA peak systolic                   36                69 ICA peak systolic                   127               72 ICA end diastolic                   49                26 PLAQUE MORPHOLOGY:                  Calcific with shadowing             Calcific PLAQUE AMOUNT:                      Moderate/severe   Moderate PLAQUE LOCATION:                    ICA, CCA, ECA     ICA, ECA, CCA  IMPRESSION: 1. Right internal carotid artery velocities suggest 40% to 59%     stenosis. 2. Left internal carotid artery velocities suggest 1% to 39% stenosis. 3. Velocities may be under-estimated due to acoustic shadowing.  ___________________________________________ Di Kindle. Edilia Bo, M.D.  EM/MEDQ  D:  08/31/2010  T:  08/31/2010  Job:  [id number]

## 2010-09-13 ENCOUNTER — Encounter: Payer: Self-pay | Admitting: Cardiology

## 2010-09-14 ENCOUNTER — Encounter: Payer: Self-pay | Admitting: Family Medicine

## 2010-09-15 ENCOUNTER — Telehealth (INDEPENDENT_AMBULATORY_CARE_PROVIDER_SITE_OTHER): Payer: Medicare Other | Admitting: Family Medicine

## 2010-09-15 ENCOUNTER — Encounter: Payer: Self-pay | Admitting: Cardiology

## 2010-09-15 ENCOUNTER — Ambulatory Visit (INDEPENDENT_AMBULATORY_CARE_PROVIDER_SITE_OTHER): Payer: Medicare Other | Admitting: Cardiology

## 2010-09-15 ENCOUNTER — Ambulatory Visit (INDEPENDENT_AMBULATORY_CARE_PROVIDER_SITE_OTHER): Payer: Medicare Other | Admitting: Family Medicine

## 2010-09-15 ENCOUNTER — Encounter: Payer: Self-pay | Admitting: Family Medicine

## 2010-09-15 ENCOUNTER — Ambulatory Visit: Payer: Self-pay

## 2010-09-15 VITALS — BP 159/101 | HR 87 | Ht 68.0 in | Wt 203.0 lb

## 2010-09-15 VITALS — BP 180/108 | HR 85 | Resp 16 | Ht 69.0 in | Wt 202.0 lb

## 2010-09-15 DIAGNOSIS — E785 Hyperlipidemia, unspecified: Secondary | ICD-10-CM

## 2010-09-15 DIAGNOSIS — E119 Type 2 diabetes mellitus without complications: Secondary | ICD-10-CM

## 2010-09-15 DIAGNOSIS — I10 Essential (primary) hypertension: Secondary | ICD-10-CM

## 2010-09-15 DIAGNOSIS — I350 Nonrheumatic aortic (valve) stenosis: Secondary | ICD-10-CM

## 2010-09-15 DIAGNOSIS — M1710 Unilateral primary osteoarthritis, unspecified knee: Secondary | ICD-10-CM

## 2010-09-15 DIAGNOSIS — M899 Disorder of bone, unspecified: Secondary | ICD-10-CM

## 2010-09-15 DIAGNOSIS — I4891 Unspecified atrial fibrillation: Secondary | ICD-10-CM

## 2010-09-15 DIAGNOSIS — I359 Nonrheumatic aortic valve disorder, unspecified: Secondary | ICD-10-CM

## 2010-09-15 MED ORDER — AMLODIPINE BESYLATE 5 MG PO TABS: 5.0000 mg | ORAL_TABLET | Freq: Every day | ORAL | Status: DC

## 2010-09-15 NOTE — Assessment & Plan Note (Signed)
Uncertain duration, possibly even chronic. Patient Is not particularly symptomatic. CHADS2 score is significantly elevated at 5, and we discussed stroke reduction with anticoagulant therapy, specifically Coumadin. He and his daughter have considered the matter further, and he does not want to pursue Coumadin/anticoagulation at this time, with concerns about bleeding complications. I asked him to continue to think about, and we would see him back over time.

## 2010-09-15 NOTE — Patient Instructions (Addendum)
F/U 3rd week in July.  Fasting lipid, cmp and eGFR, hBA1C prior to visit.Vit D also, lipid  Start new for BP amlodipine 5mg  one daily, continue the cozaar as before  Coumadin is a good drug for you to take to reduce the risk of a stroke  Blood sugar is excellent in April, pls have your daughter check every morning, range should be 80 to 120 before breakfast

## 2010-09-15 NOTE — Assessment & Plan Note (Signed)
Mild by recent echocardiogram, not likely of clinical significance.

## 2010-09-15 NOTE — Progress Notes (Signed)
Clinical Summary [name] is a 77 y.o.male presenting for followup. He was seen in early May with diagnosis of atrial fibrillation, duration uncertain. We discussed initiation of Coumadin, and the patient and his daughter wanted to consider things further, discussing it with Dr. Lodema Hong.  Followup echocardiogram is noted below. We reviewed this today. He does not complain of any palpitations or chest pain.   He and his daughter state that they have considered the matter and discussed with Dr. Lodema Hong. We reviewed the indications for anticoagulation for stroke prophylaxis with atrial fibrillation and high CHADS2 score. He is most concerned about the potential bleeding complications, and is not comfortable initiating Coumadin at this time, prefers aspirin. I explained that we would continue to follow him, and discuss the situation over time.   No Known Allergies  Current outpatient prescriptions:amLODipine (NORVASC) 5 MG tablet, Take 1 tablet (5 mg total) by mouth daily., Disp: 30 tablet, Rfl: 11;  aspirin (ASPIRIN LOW DOSE) 81 MG EC tablet, Take 81 mg by mouth daily.  , Disp: , Rfl: ;  donepezil (ARICEPT) 10 MG tablet, Take 1 tablet (10 mg total) by mouth at bedtime as needed., Disp: 30 tablet, Rfl: 3;  ergocalciferol (VITAMIN D2) 50000 UNITS capsule, Take 50,000 Units by mouth once a week.  , Disp: , Rfl:  glipiZIDE (GLIPIZIDE XL) 5 MG 24 hr tablet, Take 5 mg by mouth daily with breakfast.  , Disp: , Rfl: ;  GLUCOPHAGE 1000 MG tablet, TAKE ONE TABLET BY MOUTH TWICE DAILY., Disp: 60 each, Rfl: 3;  losartan (COZAAR) 100 MG tablet, Take 1 tablet (100 mg total) by mouth daily., Disp: 30 tablet, Rfl: 3 DISCONTD: metFORMIN (GLUMETZA) 1000 MG (MOD) 24 hr tablet, Take 1 tablet (1,000 mg total) by mouth 2 (two) times daily with a meal., Disp: 60 tablet, Rfl: 3  Past Medical History  Diagnosis Date  . Coronary atherosclerosis of native coronary artery     Details not clear  . OA (osteoarthritis) of knee     . Type 2 diabetes mellitus   . Mixed hyperlipidemia   . Essential hypertension, benign   . TIA (transient ischemic attack)   . B12 deficiency   . Carotid artery disease     60-79% RICA and 40-59% LICA -  1/10  . Acute biliary pancreatitis   . Dementia   . Atrial fibrillation     Past Surgical History  Procedure Date  . Cholecystectomy   . Hemorrhoid surgery   . Common bile duct stone removal     Family History  Problem Relation Age of Onset  . Diabetes type II    . Hypertension      Social History Mr. Batiz reports that he quit smoking about 27 years ago. His smoking use included Cigarettes. He has never used smokeless tobacco. Mr. Jarchow reports that he does not drink alcohol.  Review of Systems As outlined above, otherwise negative.  Physical Examination Filed Vitals:   09/15/10 1511  BP: 159/101  Pulse: 87   Overweight elderly male in no acute distress.  HEENT: Conjunctiva and lids normal, oropharynx with poor dentition.  Neck: Supple, no elevated JVP, soft carotid bruits, no thyromegaly.  Lungs: Diminished breath sounds but clear overall.  Cardiac: Irregularly irregular with 2/6 systolic murmur at the base, no gallop.  Abdomen: Soft, nontender, bowel sounds present.  Extremities: No pitting edema, distal pulses one plus.  Skin: Warm and dry.  Musculoskeletal: No kyphosis.  Neuropsychiatric: Alert and oriented x3, affect appropriate.  Studies Echocardiogram 08/29/2010: - Left ventricle: The cavity size was normal. There was moderate     concentric hypertrophy. Systolic function was normal. The     estimated ejection fraction was in the range of 60% to 65%.     Hypokinesis of the basal inferior myocardium.   - Aortic valve: Functionally bicuspid; moderately thickened     leaflets. There was mild stenosis. Valve area: 1.39cm^2(VTI).     Valve area: 1.22cm^2 (Vmax).   - Left atrium: The atrium was mildly dilated.   - Atrial septum: No defect or patent  foramen ovale was identified.  Problem List and Plan

## 2010-09-15 NOTE — Patient Instructions (Signed)
**Note De-identified  Obfuscation** Your physician recommends that you continue on your current medications as directed. Please refer to the Current Medication list given to you today.  Your physician recommends that you schedule a follow-up appointment in: 6 months  

## 2010-09-15 NOTE — Telephone Encounter (Signed)
Daughter to call with name of meter

## 2010-09-15 NOTE — Assessment & Plan Note (Signed)
Continue to follow up with Dr. Lodema Hong and make efforts for better blood pressure control.

## 2010-09-16 MED ORDER — ACCU-CHEK MULTICLIX LANCETS MISC: Status: AC

## 2010-09-16 MED ORDER — ACCU-CHEK AVIVA PLUS W/DEVICE KIT: 1.0000 | PACK | Freq: Every day | Status: DC

## 2010-09-16 MED ORDER — GLUCOSE BLOOD VI STRP: ORAL_STRIP | Status: AC

## 2010-09-16 NOTE — Telephone Encounter (Signed)
Sent as requested.

## 2010-09-18 NOTE — Assessment & Plan Note (Signed)
Uncontrolled, med compliance discused, rept lab due in July/August

## 2010-09-18 NOTE — Assessment & Plan Note (Signed)
Uncontrolled, med dose increased

## 2010-09-18 NOTE — Assessment & Plan Note (Signed)
Controlled, no change in medication  

## 2010-09-18 NOTE — Assessment & Plan Note (Signed)
New dx encouraged pt and his daughter strongly to take coumadin as recommended, for stroke risk reduction, they intend to comply

## 2010-09-18 NOTE — Progress Notes (Signed)
  Subjective:    Patient ID: Tyrone Hall, male    DOB: 01-08-1931, 77 y.o.   MRN: [id number]  HPI HYPERTENSION Disease Monitoring Blood pressure range-systolic over 180 Chest pain- no      Dyspnea- no Medications Compliance- good Lightheadedness- no   Edema- no   DIABETES Disease Monitoring Blood Sugar ranges-unknown Polyuria- no New Visual problems- yes Medications Compliance- fair Hypoglycemic symptoms- no   HYPERLIPIDEMIA Disease Monitoring See symptoms for Hypertension Medications Compliance- good RUQ pain- no  Muscle aches- no  Pt in for re-eval of uncontrolled hypertension, and diabetes. He was recently evaluated by his opthalmologist, and has developed new problems with his vision, already had very poor sight in 1 eye. Concern was voiced re level of control of the diabetes, HBA1C in April is excellent.   PMH Smoking Status noted  Systolic over 180   Review of Systems Denies recent fever or chills. Denies sinus pressure, nasal congestion, ear pain or sore throat. Denies chest congestion, productive cough or wheezing. Denies chest pains, palpitations, paroxysmal nocturnal dyspnea, orthopnea and leg swelling Denies abdominal pain, nausea, vomiting,diarrhea or constipation.  Chronic joint pain, swelling and limitation in mobility. Denies depression, anxiety or insomnia. Denies skin break down or rash. Chronic headaches        Objective:   Physical Exam Patient alert and oriented and in no Cardiopulmonary distress.  HEENT: No facial asymmetry,  no sinus tenderness,  Oropharynx pink and moist.  Neck supple no adenopathy.  Chest: Clear to auscultation bilaterally.  CVS: S1, S2 , positive murmur, no S3.  ABD: Soft non tender. Bowel sounds normal.  Ext: No edema  MS: Adequate ROM spine, shoulders, hips and knees.  ntact, no ulSkin: Icerations or rash noted.  Psych: Good eye contact, blunted affect. Memory impairment, not anxious or depressed  appearing.  CNS: power and  tone  normal throughout.        Assessment & Plan:

## 2010-09-20 ENCOUNTER — Telehealth: Payer: Self-pay | Admitting: Family Medicine

## 2010-09-21 NOTE — Telephone Encounter (Signed)
Called and left message to call back.

## 2010-09-21 NOTE — Telephone Encounter (Signed)
He has not been to neurology about the headaches, he has been c/o about this for a while , I recommend an a[ppt with neurology for this, and am willing to see him for uncontrolled bP this week,it should be better than last week, and we need to give the med time to work, but i am willing to see him with his meds this week to recheck the Bp, if she wants this , pls ask that he get a work in appt

## 2010-09-23 ENCOUNTER — Ambulatory Visit: Payer: Medicare Other | Admitting: Family Medicine

## 2010-09-23 ENCOUNTER — Ambulatory Visit (INDEPENDENT_AMBULATORY_CARE_PROVIDER_SITE_OTHER): Payer: Medicare Other | Admitting: Family Medicine

## 2010-09-23 VITALS — BP 160/90 | Ht 69.5 in | Wt 204.1 lb

## 2010-09-23 DIAGNOSIS — E538 Deficiency of other specified B group vitamins: Secondary | ICD-10-CM

## 2010-09-23 MED ORDER — CYANOCOBALAMIN 1000 MCG/ML IJ SOLN
1000.0000 ug | Freq: Once | INTRAMUSCULAR | Status: AC
  Administered 2010-09-23: 1000 ug via INTRAMUSCULAR

## 2010-09-23 NOTE — Telephone Encounter (Signed)
Aware. Would like referral to neurology

## 2010-09-24 NOTE — Progress Notes (Signed)
  Subjective:    Patient ID: Tyrone Hall, male    DOB: 1930/10/06, 77 y.o.   MRN: [id number]  HPI Pt with dementia on monthly vit B injections.   Review of Systems     Objective:   Physical Exam        Assessment & Plan:  B12 1cc administered IM , no complications

## 2010-09-26 ENCOUNTER — Other Ambulatory Visit: Payer: Self-pay | Admitting: Family Medicine

## 2010-09-26 DIAGNOSIS — R51 Headache: Secondary | ICD-10-CM | POA: Insufficient documentation

## 2010-09-26 DIAGNOSIS — R519 Headache, unspecified: Secondary | ICD-10-CM | POA: Insufficient documentation

## 2010-09-26 NOTE — Telephone Encounter (Signed)
Pt has been having recurrent headache, pls see referral to neurology, I am sending him to Dr Gerilyn Pilgrim, let him/his daughter know you are setting this up

## 2010-10-24 ENCOUNTER — Other Ambulatory Visit: Payer: Self-pay | Admitting: Family Medicine

## 2010-10-31 ENCOUNTER — Encounter: Payer: Self-pay | Admitting: Family Medicine

## 2010-11-07 ENCOUNTER — Ambulatory Visit: Payer: Medicare Other | Admitting: Family Medicine

## 2010-11-16 ENCOUNTER — Other Ambulatory Visit: Payer: Self-pay | Admitting: Family Medicine

## 2010-11-17 LAB — COMPLETE METABOLIC PANEL WITH GFR
Albumin: 4 g/dL (ref 3.5–5.2)
CO2: 28 mEq/L (ref 19–32)
Calcium: 9.3 mg/dL (ref 8.4–10.5)
Chloride: 106 mEq/L (ref 96–112)
GFR, Est African American: >60 mL/min (ref >60)
GFR, Est Non African American: >60 mL/min (ref >60)
Glucose, Bld: 76 mg/dL (ref 70–99)
Sodium: 144 mEq/L (ref 135–145)
Total Bilirubin: 0.5 mg/dL (ref 0.3–1.2)
Total Protein: 7.3 g/dL (ref 6.0–8.3)

## 2010-11-17 LAB — LIPID PANEL
LDL Cholesterol: 128 mg/dL — ABNORMAL HIGH (ref 0–99)
VLDL: 20 mg/dL (ref 0–40)

## 2010-11-25 ENCOUNTER — Other Ambulatory Visit: Payer: Self-pay | Admitting: Family Medicine

## 2010-11-25 ENCOUNTER — Encounter: Payer: Self-pay | Admitting: Family Medicine

## 2010-11-28 ENCOUNTER — Encounter: Payer: Self-pay | Admitting: Family Medicine

## 2010-11-29 ENCOUNTER — Ambulatory Visit: Payer: Medicare Other | Admitting: Family Medicine

## 2010-12-14 ENCOUNTER — Encounter: Payer: Self-pay | Admitting: Family Medicine

## 2010-12-15 ENCOUNTER — Encounter: Payer: Self-pay | Admitting: Family Medicine

## 2010-12-15 ENCOUNTER — Ambulatory Visit: Payer: Medicare Other | Admitting: Family Medicine

## 2010-12-16 ENCOUNTER — Other Ambulatory Visit: Payer: Self-pay | Admitting: Family Medicine

## 2010-12-26 ENCOUNTER — Other Ambulatory Visit: Payer: Self-pay | Admitting: Family Medicine

## 2010-12-27 ENCOUNTER — Encounter: Payer: Self-pay | Admitting: Family Medicine

## 2010-12-28 ENCOUNTER — Ambulatory Visit (INDEPENDENT_AMBULATORY_CARE_PROVIDER_SITE_OTHER): Payer: Medicare Other | Admitting: Family Medicine

## 2010-12-28 ENCOUNTER — Encounter: Payer: Self-pay | Admitting: Family Medicine

## 2010-12-28 DIAGNOSIS — Z13820 Encounter for screening for osteoporosis: Secondary | ICD-10-CM

## 2010-12-28 DIAGNOSIS — D649 Anemia, unspecified: Secondary | ICD-10-CM

## 2010-12-28 DIAGNOSIS — I10 Essential (primary) hypertension: Secondary | ICD-10-CM

## 2010-12-28 DIAGNOSIS — E119 Type 2 diabetes mellitus without complications: Secondary | ICD-10-CM

## 2010-12-28 DIAGNOSIS — M1710 Unilateral primary osteoarthritis, unspecified knee: Secondary | ICD-10-CM

## 2010-12-28 DIAGNOSIS — R5381 Other malaise: Secondary | ICD-10-CM

## 2010-12-28 DIAGNOSIS — F0390 Unspecified dementia without behavioral disturbance: Secondary | ICD-10-CM

## 2010-12-28 DIAGNOSIS — M949 Disorder of cartilage, unspecified: Secondary | ICD-10-CM

## 2010-12-28 DIAGNOSIS — E538 Deficiency of other specified B group vitamins: Secondary | ICD-10-CM

## 2010-12-28 DIAGNOSIS — R5383 Other fatigue: Secondary | ICD-10-CM

## 2010-12-28 DIAGNOSIS — E785 Hyperlipidemia, unspecified: Secondary | ICD-10-CM

## 2010-12-28 DIAGNOSIS — Z125 Encounter for screening for malignant neoplasm of prostate: Secondary | ICD-10-CM

## 2010-12-28 DIAGNOSIS — Z23 Encounter for immunization: Secondary | ICD-10-CM

## 2010-12-28 DIAGNOSIS — M899 Disorder of bone, unspecified: Secondary | ICD-10-CM

## 2010-12-28 MED ORDER — LOSARTAN POTASSIUM 100 MG PO TABS: 100.0000 mg | ORAL_TABLET | Freq: Every day | ORAL | Status: DC

## 2010-12-28 MED ORDER — DONEPEZIL HCL 10 MG PO TABS: 10.0000 mg | ORAL_TABLET | Freq: Every day | ORAL | Status: DC

## 2010-12-28 MED ORDER — CYANOCOBALAMIN 1000 MCG/ML IJ SOLN
1000.0000 ug | Freq: Once | INTRAMUSCULAR | Status: AC
  Administered 2010-12-28: 1000 ug via INTRAMUSCULAR

## 2010-12-28 MED ORDER — GLIPIZIDE ER 5 MG PO TB24: 5.0000 mg | ORAL_TABLET | Freq: Every day | ORAL | Status: DC

## 2010-12-28 MED ORDER — METFORMIN HCL 1000 MG PO TABS: 1000.0000 mg | ORAL_TABLET | Freq: Two times a day (BID) | ORAL | Status: DC

## 2010-12-28 MED ORDER — INFLUENZA VAC TYPES A & B PF IM SUSP: 0.5000 mL | Freq: Once | INTRAMUSCULAR | Status: DC

## 2010-12-28 NOTE — Patient Instructions (Signed)
F/U first week December with MD. Get fasting labs end November.  B12 injections every 4 weeks  With nurse  Blood pressure is good , no med changes

## 2010-12-29 NOTE — Assessment & Plan Note (Signed)
Low fat diet discussed and encouraged, LDL elevated, , no med change at this time

## 2010-12-29 NOTE — Progress Notes (Signed)
  Subjective:    Patient ID: Tyrone Hall, male    DOB: [birth year], 77 y.o.   MRN: [id number]  HPI The PT is here for follow up and re-evaluation of chronic medical conditions, medication management and review of any available recent lab and radiology data.  Preventive health is updated, specifically  Cancer screening and Immunization.   Questions or concerns regarding consultations or procedures which the PT has had in the interim are  addressed. The PT denies any adverse reactions to current medications since the last visit.  There are no new concerns.  There are no specific complaints       Review of Systems Denies recent fever or chills. Denies sinus pressure, nasal congestion, ear pain or sore throat. Denies chest congestion, productive cough or wheezing. Denies chest pains, palpitations and leg swelling Denies abdominal pain, nausea, vomiting,diarrhea or constipation.   Denies dysuria, frequency, hesitancy or incontinence. Denies joint pain, swelling and limitation in mobility. Denies headaches, seizures, numbness, or tingling. Denies depression, anxiety or insomnia. Denies skin break down or rash.        Objective:   Physical Exam Patient alert and oriented and in no cardiopulmonary distress.  HEENT: No facial asymmetry, EOMI, no sinus tenderness,  oropharynx pink and moist.  Neck supple no adenopathy.  Chest: Clear to auscultation bilaterally.  CVS: S1, S2 no murmurs, no S3.  ABD: Soft non tender. Bowel sounds normal.  Ext: No edema  MS: Adequate ROM spine, shoulders, hips and knees.  Skin: Intact, no ulcerations or rash noted.  Psych: Good eye contact, normal affect. Memory mildly impaired, not anxious or depressed appearing.  CNS: CN 2-12 intact, power, tone and sensation normal throughout.        Assessment & Plan:

## 2010-12-29 NOTE — Assessment & Plan Note (Signed)
Controlled, no change in medication  

## 2010-12-29 NOTE — Assessment & Plan Note (Signed)
Currently asymptomatic , and no recent falls

## 2010-12-29 NOTE — Assessment & Plan Note (Signed)
Monthly B12 injections. 

## 2010-12-29 NOTE — Assessment & Plan Note (Signed)
Improved, though SBP is 140, no further med changes

## 2011-01-25 ENCOUNTER — Ambulatory Visit (INDEPENDENT_AMBULATORY_CARE_PROVIDER_SITE_OTHER): Payer: Medicare Other

## 2011-01-25 VITALS — BP 140/78 | Wt 213.0 lb

## 2011-01-25 DIAGNOSIS — E538 Deficiency of other specified B group vitamins: Secondary | ICD-10-CM

## 2011-01-25 MED ORDER — CYANOCOBALAMIN 1000 MCG/ML IJ SOLN
1000.0000 ug | Freq: Once | INTRAMUSCULAR | Status: AC
  Administered 2011-01-25: 1000 ug via INTRAMUSCULAR

## 2011-01-25 NOTE — Progress Notes (Signed)
Injection given with no complications 

## 2011-02-22 ENCOUNTER — Ambulatory Visit: Payer: Medicare Other

## 2011-03-06 ENCOUNTER — Ambulatory Visit: Payer: Medicare Other | Admitting: Cardiology

## 2011-03-22 ENCOUNTER — Ambulatory Visit: Payer: Medicare Other

## 2011-04-07 ENCOUNTER — Ambulatory Visit: Payer: Medicare Other | Admitting: Family Medicine

## 2011-04-17 ENCOUNTER — Encounter: Payer: Self-pay | Admitting: Family Medicine

## 2011-04-26 ENCOUNTER — Encounter: Payer: Self-pay | Admitting: Family Medicine

## 2011-05-01 ENCOUNTER — Ambulatory Visit: Payer: Medicare Other | Admitting: Family Medicine

## 2011-05-01 ENCOUNTER — Encounter: Payer: Self-pay | Admitting: Family Medicine

## 2011-05-01 ENCOUNTER — Other Ambulatory Visit: Payer: Self-pay | Admitting: Family Medicine

## 2011-05-04 ENCOUNTER — Encounter: Payer: Self-pay | Admitting: Family Medicine

## 2011-05-15 ENCOUNTER — Encounter: Payer: Self-pay | Admitting: Family Medicine

## 2011-05-16 ENCOUNTER — Encounter: Payer: Self-pay | Admitting: Family Medicine

## 2011-05-16 ENCOUNTER — Ambulatory Visit (INDEPENDENT_AMBULATORY_CARE_PROVIDER_SITE_OTHER): Payer: Medicare Other | Admitting: Family Medicine

## 2011-05-16 VITALS — BP 140/74 | HR 72 | Resp 18 | Ht 69.0 in | Wt 210.1 lb

## 2011-05-16 DIAGNOSIS — E785 Hyperlipidemia, unspecified: Secondary | ICD-10-CM

## 2011-05-16 DIAGNOSIS — E538 Deficiency of other specified B group vitamins: Secondary | ICD-10-CM

## 2011-05-16 DIAGNOSIS — E119 Type 2 diabetes mellitus without complications: Secondary | ICD-10-CM | POA: Diagnosis not present

## 2011-05-16 DIAGNOSIS — M1710 Unilateral primary osteoarthritis, unspecified knee: Secondary | ICD-10-CM | POA: Diagnosis not present

## 2011-05-16 DIAGNOSIS — I10 Essential (primary) hypertension: Secondary | ICD-10-CM

## 2011-05-16 DIAGNOSIS — IMO0002 Reserved for concepts with insufficient information to code with codable children: Secondary | ICD-10-CM

## 2011-05-16 DIAGNOSIS — R5381 Other malaise: Secondary | ICD-10-CM | POA: Diagnosis not present

## 2011-05-16 MED ORDER — CYANOCOBALAMIN 1000 MCG/ML IJ SOLN
1000.0000 ug | Freq: Once | INTRAMUSCULAR | Status: AC
  Administered 2011-05-16: 1000 ug via INTRAMUSCULAR

## 2011-05-16 NOTE — Assessment & Plan Note (Signed)
Controlled, no change in medication  

## 2011-05-16 NOTE — Patient Instructions (Signed)
F/u in 4 months.  Labs today.  Stop omeprazole since you have no heartbuurn    Labs today.  b12 today.  Once monthly nurse visit for b12  It is important that you exercise regularly at least 30 minutes 5 times a week. If you develop chest pain, have severe difficulty breathing, or feel very tired, stop exercising immediately and seek medical attention    A healthy diet is rich in fruit, vegetables and whole grains. Poultry fish, nuts and beans are a healthy choice for protein rather then red meat. A low sodium diet and drinking 64 ounces of water daily is generally recommended. Oils and sweet should be limited. Carbohydrates especially for those who are diabetic or overweight, should be limited to 34-45 gram per meal. It is important to eat on a regular schedule, at least 3 times daily. Snacks should be primarily fruits, vegetables or nuts.

## 2011-05-16 NOTE — Assessment & Plan Note (Signed)
Improved symptoms

## 2011-05-16 NOTE — Assessment & Plan Note (Addendum)
Lab today, med to be adjusted based on result

## 2011-05-17 LAB — TSH: TSH: 0.403 u[IU]/mL (ref 0.350–4.500)

## 2011-05-17 LAB — COMPLETE METABOLIC PANEL WITH GFR
Albumin: 4.2 g/dL (ref 3.5–5.2)
BUN: 10 mg/dL (ref 6–23)
Calcium: 9.4 mg/dL (ref 8.4–10.5)
Chloride: 104 mEq/L (ref 96–112)
GFR, Est Non African American: 52 mL/min — ABNORMAL LOW (ref >60)
Glucose, Bld: 78 mg/dL (ref 70–99)
Potassium: 4.5 mEq/L (ref 3.5–5.3)
Sodium: 141 mEq/L (ref 135–145)
Total Protein: 7.6 g/dL (ref 6.0–8.3)

## 2011-05-17 LAB — HEMOGLOBIN A1C
Hgb A1c MFr Bld: 5.9 % — ABNORMAL HIGH (ref <5.7)
Mean Plasma Glucose: 123 mg/dL — ABNORMAL HIGH (ref <117)

## 2011-05-17 LAB — LIPID PANEL: Cholesterol: 191 mg/dL (ref 0–200)

## 2011-05-21 NOTE — Progress Notes (Signed)
  Subjective:    Patient ID: Tyrone Hall, male    DOB: January 09, 1931, 78 y.o.   MRN: [id number]  HPI The PT is here for follow up and re-evaluation of chronic medical conditions, medication management and review of any available recent lab and radiology data.  Preventive health is updated, specifically  Cancer screening and Immunization.   Questions or concerns regarding consultations or procedures which the PT has had in the interim are  addressed. The PT denies any adverse reactions to current medications since the last visit.  There are no new concerns.  There are no specific complaints       Review of Systems See HPI Denies recent fever or chills. Denies sinus pressure, nasal congestion, ear pain or sore throat. Denies chest congestion, productive cough or wheezing. Denies chest pains, palpitations and leg swelling Denies abdominal pain, nausea, vomiting,diarrhea or constipation.   Denies dysuria, frequency, hesitancy or incontinence. Denies joint pain, swelling and limitation in mobility. Denies headaches, seizures, numbness, or tingling. Denies depression, anxiety or insomnia. Denies skin break down or rash.        Objective:   Physical Exam Patient alert and oriented and in no cardiopulmonary distress.  HEENT: No facial asymmetry, EOMI, no sinus tenderness,  oropharynx pink and moist.  Neck decreased ROM,no adenopathy.  Chest: Clear to auscultation bilaterally.  CVS: S1, S2 no murmurs, no S3.  ABD: Soft non tender. Bowel sounds normal.  Ext: No edema  WU:[id number]  ROM spine, shoulders, hips and knees.  Skin: Intact, no ulcerations or rash noted.  Psych: Good eye contact, normal affect. Memory intact not anxious or depressed appearing.  CNS: CN 2-12 intact, power, tone and sensation normal throughout.        Assessment & Plan:

## 2011-05-21 NOTE — Assessment & Plan Note (Signed)
Uncontrolled, low fat diet discussed and encouraged, impt of taking med regularly as prescribed also stressed

## 2011-05-21 NOTE — Assessment & Plan Note (Signed)
Dementia with low B12, needs monthly injections for life

## 2011-06-05 ENCOUNTER — Other Ambulatory Visit: Payer: Self-pay | Admitting: Family Medicine

## 2011-06-16 ENCOUNTER — Ambulatory Visit: Payer: Medicare Other

## 2011-07-04 ENCOUNTER — Other Ambulatory Visit: Payer: Self-pay | Admitting: Family Medicine

## 2011-07-31 ENCOUNTER — Ambulatory Visit (INDEPENDENT_AMBULATORY_CARE_PROVIDER_SITE_OTHER): Payer: Medicare Other | Admitting: Family Medicine

## 2011-07-31 ENCOUNTER — Encounter: Payer: Self-pay | Admitting: Family Medicine

## 2011-07-31 VITALS — BP 118/70 | HR 65 | Resp 16 | Ht 69.5 in | Wt 203.0 lb

## 2011-07-31 DIAGNOSIS — E538 Deficiency of other specified B group vitamins: Secondary | ICD-10-CM

## 2011-07-31 DIAGNOSIS — M25569 Pain in unspecified knee: Secondary | ICD-10-CM | POA: Diagnosis not present

## 2011-07-31 DIAGNOSIS — E785 Hyperlipidemia, unspecified: Secondary | ICD-10-CM | POA: Diagnosis not present

## 2011-07-31 DIAGNOSIS — I10 Essential (primary) hypertension: Secondary | ICD-10-CM | POA: Diagnosis not present

## 2011-07-31 DIAGNOSIS — F0390 Unspecified dementia without behavioral disturbance: Secondary | ICD-10-CM

## 2011-07-31 DIAGNOSIS — I4891 Unspecified atrial fibrillation: Secondary | ICD-10-CM

## 2011-07-31 DIAGNOSIS — Z125 Encounter for screening for malignant neoplasm of prostate: Secondary | ICD-10-CM

## 2011-07-31 DIAGNOSIS — E119 Type 2 diabetes mellitus without complications: Secondary | ICD-10-CM

## 2011-07-31 DIAGNOSIS — M1710 Unilateral primary osteoarthritis, unspecified knee: Secondary | ICD-10-CM

## 2011-07-31 DIAGNOSIS — IMO0002 Reserved for concepts with insufficient information to code with codable children: Secondary | ICD-10-CM

## 2011-07-31 DIAGNOSIS — R5381 Other malaise: Secondary | ICD-10-CM

## 2011-07-31 DIAGNOSIS — R5383 Other fatigue: Secondary | ICD-10-CM | POA: Diagnosis not present

## 2011-07-31 MED ORDER — KETOROLAC TROMETHAMINE 60 MG/2ML IJ SOLN
60.0000 mg | Freq: Once | INTRAMUSCULAR | Status: AC
  Administered 2011-07-31: 60 mg via INTRAMUSCULAR

## 2011-07-31 MED ORDER — CYANOCOBALAMIN 1000 MCG/ML IJ SOLN
1000.0000 ug | Freq: Once | INTRAMUSCULAR | Status: AC
  Administered 2011-07-31: 1000 ug via INTRAMUSCULAR

## 2011-07-31 NOTE — Progress Notes (Signed)
  Subjective:    Patient ID: Tyrone Hall, male    DOB: 23-Jan-1931, 78 y.o.   MRN: [id number]  HPI The PT is here for follow up and re-evaluation of chronic medical conditions, medication management and review of any available recent lab and radiology data.  Preventive health is updated, specifically  Cancer screening and Immunization.   Questions or concerns regarding consultations or procedures which the PT has had in the interim are  addressed. The PT denies any adverse reactions to current medications since the last visit.  There are no new concerns.  C/o worsened, increased and uncontrolled knee pain, no falls or trauma, wants injection  today for this. Has been forgetting the monhtly B12 shots needs to resume rgulalrly Does not test sugars , no symptoms of hypo or hyper gycemia    Review of Systems See HPI Denies recent fever or chills. Denies sinus pressure, nasal congestion, ear pain or sore throat. Denies chest congestion, productive cough or wheezing. Denies chest pains, palpitations and leg swelling Denies abdominal pain, nausea, vomiting,diarrhea or constipation.   Denies dysuria, frequency, hesitancy or incontinence.  Denies headaches, seizures, numbness, or tingling. Denies depression, anxiety or insomnia. Denies skin break down or rash.        Objective:   Physical Exam Patient alert and oriented and in no cardiopulmonary distress.  HEENT: No facial asymmetry, EOMI, no sinus tenderness,  oropharynx pink and moist.  Neck supple no adenopathy.  Chest: Clear to auscultation bilaterally.  CVS: S1, S2 no murmurs, no S3.  ABD: Soft non tender. Bowel sounds normal.  Ext: No edema  MS: decreased  ROM spine,  hips and knees.  Skin: Intact, no ulcerations or rash noted.  Psych: Good eye contact, normal affect. Memory intact not anxious or depressed appearing.  CNS: CN 2-12 intact, power, tone and sensation normal throughout.        Assessment & Plan:

## 2011-07-31 NOTE — Patient Instructions (Signed)
F/u in 2 months, needs rectal exam at that visit.  B12 today and every month  Fasting lipid,, cmp and EGFR, HBA1C, PSA, cBC in 2 months, BEFORE visit  Microalb today.  Use tylenol arthritis one daily, as needed for leg pain.  Your blood pressure is excellent, no med change

## 2011-08-01 LAB — MICROALBUMIN / CREATININE URINE RATIO: Microalb Creat Ratio: 40.2 mg/g — ABNORMAL HIGH (ref 0.0–30.0)

## 2011-08-21 NOTE — Assessment & Plan Note (Addendum)
C/o increased pain pt to take tylenol regularly, toradol administered at visit

## 2011-08-21 NOTE — Assessment & Plan Note (Signed)
Uncontrolled elevated LDL low fat diet discussed and encouraged, no med change

## 2011-08-21 NOTE — Assessment & Plan Note (Signed)
Needs to resume monthly B12 injections , has been "forgetting", continue aricept

## 2011-08-21 NOTE — Assessment & Plan Note (Signed)
Controlled, no change in medication  

## 2011-08-21 NOTE — Assessment & Plan Note (Signed)
Needs updated lab may b over corrected based on Jan lab

## 2011-08-21 NOTE — Assessment & Plan Note (Signed)
Rate controlled 

## 2011-09-05 ENCOUNTER — Encounter: Payer: Self-pay | Admitting: Neurosurgery

## 2011-09-06 ENCOUNTER — Other Ambulatory Visit: Payer: Medicare Other

## 2011-09-06 ENCOUNTER — Ambulatory Visit: Payer: Medicare Other | Admitting: Neurosurgery

## 2011-10-02 ENCOUNTER — Other Ambulatory Visit: Payer: Self-pay | Admitting: Family Medicine

## 2011-10-02 ENCOUNTER — Encounter: Payer: Self-pay | Admitting: Family Medicine

## 2011-10-02 ENCOUNTER — Ambulatory Visit (INDEPENDENT_AMBULATORY_CARE_PROVIDER_SITE_OTHER): Payer: Medicare Other | Admitting: Family Medicine

## 2011-10-02 VITALS — BP 130/74 | HR 90 | Resp 16 | Ht 69.0 in | Wt 207.0 lb

## 2011-10-02 DIAGNOSIS — R5381 Other malaise: Secondary | ICD-10-CM | POA: Diagnosis not present

## 2011-10-02 DIAGNOSIS — E119 Type 2 diabetes mellitus without complications: Secondary | ICD-10-CM | POA: Diagnosis not present

## 2011-10-02 DIAGNOSIS — E785 Hyperlipidemia, unspecified: Secondary | ICD-10-CM | POA: Diagnosis not present

## 2011-10-02 DIAGNOSIS — E538 Deficiency of other specified B group vitamins: Secondary | ICD-10-CM

## 2011-10-02 DIAGNOSIS — I10 Essential (primary) hypertension: Secondary | ICD-10-CM | POA: Diagnosis not present

## 2011-10-02 DIAGNOSIS — F0390 Unspecified dementia without behavioral disturbance: Secondary | ICD-10-CM | POA: Diagnosis not present

## 2011-10-02 LAB — COMPLETE METABOLIC PANEL WITH GFR
ALT: 9 U/L (ref 0–53)
AST: 12 U/L (ref 0–37)
CO2: 28 mEq/L (ref 19–32)
Chloride: 106 mEq/L (ref 96–112)
Creat: 1.09 mg/dL (ref 0.50–1.35)
GFR, Est African American: 75 mL/min
Sodium: 144 mEq/L (ref 135–145)
Total Bilirubin: 0.8 mg/dL (ref 0.3–1.2)
Total Protein: 7.2 g/dL (ref 6.0–8.3)

## 2011-10-02 LAB — CBC
HCT: 34.7 % — ABNORMAL LOW (ref 39.0–52.0)
Hemoglobin: 11.8 g/dL — ABNORMAL LOW (ref 13.0–17.0)
MCV: 72.9 fL — ABNORMAL LOW (ref 78.0–100.0)
RDW: 19.9 % — ABNORMAL HIGH (ref 11.5–15.5)
WBC: 8.6 10*3/uL (ref 4.0–10.5)

## 2011-10-02 LAB — LIPID PANEL
HDL: 36 mg/dL — ABNORMAL LOW (ref >39)
LDL Cholesterol: 131 mg/dL — ABNORMAL HIGH (ref 0–99)
VLDL: 19 mg/dL (ref 0–40)

## 2011-10-02 LAB — HEMOGLOBIN A1C: Mean Plasma Glucose: 111 mg/dL (ref <117)

## 2011-10-02 MED ORDER — CYANOCOBALAMIN 1000 MCG/ML IJ SOLN
1000.0000 ug | Freq: Once | INTRAMUSCULAR | Status: AC
  Administered 2011-10-02: 1000 ug via INTRAMUSCULAR

## 2011-10-02 NOTE — Progress Notes (Signed)
  Subjective:    Patient ID: Tyrone Hall, male    DOB: 26-Oct-1930, 78 y.o.   MRN: [id number]  HPI The PT is here for follow up and re-evaluation of chronic medical conditions, medication management and review of any available recent lab and radiology data.  Preventive health is updated, specifically  Cancer screening and Immunization.   Questions or concerns regarding consultations or procedures which the PT has had in the interim are  addressed. The PT denies any adverse reactions to current medications since the last visit.  There are no new concerns.  There are no specific complaints. Still does not test blood sugars regularly, denies polyuria, polydipsia, blurred vision or hypoglycemic episodes       Review of Systems See HPI Denies recent fever or chills. Denies sinus pressure, nasal congestion, ear pain or sore throat. Denies chest congestion, productive cough or wheezing. Denies chest pains, palpitations and leg swelling Denies abdominal pain, nausea, vomiting,diarrhea or constipation.   Denies dysuria, frequency, hesitancy or incontinence. Chronic  joint pain,  and limitation in mobility. Denies headaches, seizures, numbness, or tingling. Denies depression, anxiety or insomnia. Denies skin break down or rash.        Objective:   Physical Exam Patient alert and oriented and in no cardiopulmonary distress.  HEENT: No facial asymmetry, EOMI, no sinus tenderness,  oropharynx pink and moist.  Neck supple no adenopathy.  Chest: Clear to auscultation bilaterally.  CVS: S1, S2 no murmurs, no S3.  ABD: Soft non tender. Bowel sounds normal.  Ext: No edema  MS: Adequate ROM spine, shoulders, hips and knees.  Skin: Intact, no ulcerations or rash noted.  Psych: Good eye contact, normal affect. Memory impaired not anxious or depressed appearing.  CNS: CN 2-12 intact, power, tone and sensation normal throughout.        Assessment & Plan:

## 2011-10-02 NOTE — Patient Instructions (Addendum)
F/u in 4 months with rectal exam and foot exam  B12 today and every 4 weeks  Ask at your pharmacy for the shingles vaccine, you need this  Keep well, and call here if you need me before   No changes in medication at this time.   You will get next lab order at next visit

## 2011-10-03 ENCOUNTER — Ambulatory Visit: Payer: Medicare Other | Admitting: Family Medicine

## 2011-10-03 ENCOUNTER — Other Ambulatory Visit: Payer: Self-pay | Admitting: Family Medicine

## 2011-10-03 LAB — MICROALBUMIN / CREATININE URINE RATIO
Creatinine, Urine: 365.1 mg/dL
Microalb Creat Ratio: 33.7 mg/g — ABNORMAL HIGH (ref 0.0–30.0)
Microalb, Ur: 12.32 mg/dL — ABNORMAL HIGH (ref 0.00–1.89)

## 2011-10-18 NOTE — Assessment & Plan Note (Signed)
Improved, discontinue glucotrol

## 2011-10-18 NOTE — Assessment & Plan Note (Signed)
Stable continue medication and monthly B12

## 2011-10-18 NOTE — Assessment & Plan Note (Signed)
Controlled, no change in medication  

## 2011-10-18 NOTE — Assessment & Plan Note (Signed)
Elevated L:DL low fat diet discussed and encouraged , no med change at this time

## 2011-10-30 ENCOUNTER — Ambulatory Visit: Payer: Medicare Other

## 2011-11-02 ENCOUNTER — Other Ambulatory Visit: Payer: Self-pay | Admitting: Family Medicine

## 2012-01-17 ENCOUNTER — Ambulatory Visit: Payer: Medicare Other | Admitting: Family Medicine

## 2012-01-18 ENCOUNTER — Encounter: Payer: Self-pay | Admitting: Family Medicine

## 2012-01-30 ENCOUNTER — Other Ambulatory Visit: Payer: Self-pay | Admitting: Family Medicine

## 2012-02-01 ENCOUNTER — Other Ambulatory Visit: Payer: Self-pay | Admitting: Family Medicine

## 2012-02-02 ENCOUNTER — Other Ambulatory Visit: Payer: Self-pay | Admitting: Family Medicine

## 2012-02-29 ENCOUNTER — Other Ambulatory Visit: Payer: Self-pay | Admitting: Family Medicine

## 2012-03-06 ENCOUNTER — Encounter: Payer: Self-pay | Admitting: Family Medicine

## 2012-03-06 ENCOUNTER — Ambulatory Visit (INDEPENDENT_AMBULATORY_CARE_PROVIDER_SITE_OTHER): Payer: Medicare Other | Admitting: Family Medicine

## 2012-03-06 VITALS — BP 130/80 | HR 71 | Resp 15 | Ht 69.0 in | Wt 207.0 lb

## 2012-03-06 DIAGNOSIS — E785 Hyperlipidemia, unspecified: Secondary | ICD-10-CM

## 2012-03-06 DIAGNOSIS — I10 Essential (primary) hypertension: Secondary | ICD-10-CM

## 2012-03-06 DIAGNOSIS — F0390 Unspecified dementia without behavioral disturbance: Secondary | ICD-10-CM | POA: Diagnosis not present

## 2012-03-06 DIAGNOSIS — M1710 Unilateral primary osteoarthritis, unspecified knee: Secondary | ICD-10-CM

## 2012-03-06 DIAGNOSIS — E119 Type 2 diabetes mellitus without complications: Secondary | ICD-10-CM | POA: Diagnosis not present

## 2012-03-06 DIAGNOSIS — B351 Tinea unguium: Secondary | ICD-10-CM | POA: Insufficient documentation

## 2012-03-06 MED ORDER — TERBINAFINE HCL 250 MG PO TABS: 250.0000 mg | ORAL_TABLET | Freq: Every day | ORAL | Status: DC

## 2012-03-06 MED ORDER — DONEPEZIL HCL 10 MG PO TABS: ORAL_TABLET | ORAL | Status: DC

## 2012-03-06 NOTE — Progress Notes (Signed)
  Subjective:    Patient ID: Tyrone Hall, male    DOB: [birth year], 78 y.o.   MRN: [id number]  HPI The PT is here for follow up and re-evaluation of chronic medical conditions, medication management and review of any available recent lab and radiology data.  Preventive health is updated, specifically  Cancer screening and Immunization.   . The PT denies any adverse reactions to current medications since the last visit.  There are no new concerns.  There are no specific complaints       Review of Systems See HPI Denies recent fever or chills. Denies sinus pressure, nasal congestion, ear pain or sore throat. Denies chest congestion, productive cough or wheezing. Denies chest pains, palpitations and leg swelling Denies abdominal pain, nausea, vomiting,diarrhea or constipation.   Denies dysuria, frequency, hesitancy or incontinence.   Chronic, non disabling joint pain, mild  limitation in mobility.No falls since last visit and for the entire year Denies headaches, seizures, numbness, or tingling. Denies depression, anxiety or insomnia. Denies skin break down or rash.        Objective:   Physical Exam  Patient alert and oriented and in no cardiopulmonary distress.  HEENT: No facial asymmetry, EOMI, no sinus tenderness,  oropharynx pink and moist.  Neck supple no adenopathy.  Chest: Clear to auscultation bilaterally.  CVS: S1, S2 no murmurs, no S3.  ABD: Soft non tender. Bowel sounds normal. Rectal:no mass, prostate smooth and firm, no nodule, not enlarged. Heme negative stool Ext: No edema  MS: Adequate ROM spine, shoulders, hips and knees.  Skin: Intact, no ulcerations or rash noted.  Psych: Good eye contact, normal affect. Memory intact not anxious or depressed appearing.  CNS: CN 2-12 intact, power, tone and sensation normal throughout. Diabetic Foot Check:  Appearance - tinea pedis Skin - severe onychomycosis, espescialy affecting great toes Sensation -  grossly intact to light touch Monofilament testing -  Right - Great toe, medial, central, lateral ball and posterior foot diminished Left - Great toe, medial, central, lateral ball and posterior foot diminished Pulses Left - Dorsalis Pedis and Posterior Tibia normal Right - Dorsalis Pedis and Posterior Tibia normal       Assessment & Plan:

## 2012-03-06 NOTE — Patient Instructions (Addendum)
Annual wellness in 4.5 month, please ask one of your children to come with you if possible  You are doing extremely well.  Please get your flu vaccine at your pharmacy, you need it!  HBA1C , cmp and EGFR today  I believe that you will need tocut back on your diabetes medication we will contact you tomorrow, or come by the office after breakfast so you can understand clearly what to do about your metformin (the diabetes pill)  New medication starting today , for four months for fungal toenail infection, terbinafine , one daily.  Rectal exam today.  Fasting lipid, cmp and EGFR and HBa1C in 4.5 month

## 2012-03-07 ENCOUNTER — Other Ambulatory Visit: Payer: Self-pay | Admitting: Family Medicine

## 2012-03-07 ENCOUNTER — Other Ambulatory Visit: Payer: Self-pay

## 2012-03-07 LAB — COMPLETE METABOLIC PANEL WITH GFR
CO2: 27 mEq/L (ref 19–32)
Creat: 1.36 mg/dL — ABNORMAL HIGH (ref 0.50–1.35)
GFR, Est African American: 57 mL/min — ABNORMAL LOW
GFR, Est Non African American: 49 mL/min — ABNORMAL LOW
Glucose, Bld: 78 mg/dL (ref 70–99)
Total Bilirubin: 0.8 mg/dL (ref 0.3–1.2)

## 2012-03-07 MED ORDER — METFORMIN HCL 1000 MG PO TABS: 1000.0000 mg | ORAL_TABLET | Freq: Every day | ORAL | Status: DC

## 2012-03-07 NOTE — Assessment & Plan Note (Signed)
No falls, able to ambulate without assistance

## 2012-03-07 NOTE — Assessment & Plan Note (Signed)
Controlled, no change in medication DASH diet and commitment to daily physical activity for a minimum of 30 minutes discussed and encouraged, as a part of hypertension management. The importance of attaining a healthy weight is also discussed.  

## 2012-03-07 NOTE — Assessment & Plan Note (Signed)
Hyperlipidemia:Low fat diet discussed and encouraged.  Updated lab next visit 

## 2012-03-07 NOTE — Assessment & Plan Note (Signed)
Severe, terbinafine x 4 months prescribed

## 2012-03-07 NOTE — Assessment & Plan Note (Signed)
Stable on current med, continue same 

## 2012-03-07 NOTE — Assessment & Plan Note (Signed)
Over corrected, reduce to one metformin once daily, will need to cnotact pt with this since lab after visit

## 2012-05-10 ENCOUNTER — Other Ambulatory Visit: Payer: Self-pay | Admitting: Family Medicine

## 2012-05-31 ENCOUNTER — Other Ambulatory Visit: Payer: Self-pay | Admitting: Family Medicine

## 2012-07-04 ENCOUNTER — Encounter: Payer: Self-pay | Admitting: Family Medicine

## 2012-07-04 ENCOUNTER — Ambulatory Visit: Payer: Medicare Other | Admitting: Family Medicine

## 2012-07-23 ENCOUNTER — Encounter: Payer: Self-pay | Admitting: Family Medicine

## 2012-07-23 ENCOUNTER — Ambulatory Visit (INDEPENDENT_AMBULATORY_CARE_PROVIDER_SITE_OTHER): Payer: Medicare Other | Admitting: Family Medicine

## 2012-07-23 VITALS — BP 130/80 | HR 78 | Resp 16 | Ht 69.0 in | Wt 220.0 lb

## 2012-07-23 DIAGNOSIS — R5381 Other malaise: Secondary | ICD-10-CM

## 2012-07-23 DIAGNOSIS — E785 Hyperlipidemia, unspecified: Secondary | ICD-10-CM

## 2012-07-23 DIAGNOSIS — F0390 Unspecified dementia without behavioral disturbance: Secondary | ICD-10-CM

## 2012-07-23 DIAGNOSIS — I10 Essential (primary) hypertension: Secondary | ICD-10-CM

## 2012-07-23 DIAGNOSIS — E119 Type 2 diabetes mellitus without complications: Secondary | ICD-10-CM

## 2012-07-23 DIAGNOSIS — D649 Anemia, unspecified: Secondary | ICD-10-CM | POA: Diagnosis not present

## 2012-07-23 DIAGNOSIS — Z125 Encounter for screening for malignant neoplasm of prostate: Secondary | ICD-10-CM

## 2012-07-23 DIAGNOSIS — E538 Deficiency of other specified B group vitamins: Secondary | ICD-10-CM

## 2012-07-23 DIAGNOSIS — R5383 Other fatigue: Secondary | ICD-10-CM

## 2012-07-23 DIAGNOSIS — B351 Tinea unguium: Secondary | ICD-10-CM

## 2012-07-23 NOTE — Patient Instructions (Addendum)
Annual wellness in early October, call if you need me before  Labs today, lipid, cmp and EGFr, hBA1C, pSA, tSH, CBC  PLEASE take medication for your toenails , you need this.  Pls see the eye specialist

## 2012-07-23 NOTE — Progress Notes (Signed)
  Subjective:    Patient ID: Tyrone Hall, male    DOB: 1930-08-29, 79 y.o.   MRN: [id number]  HPI  The PT is here for follow up and re-evaluation of chronic medical conditions, medication management and review of any available recent lab and radiology data.  Preventive health is updated, specifically  Cancer screening and Immunization.   Questions or concerns regarding consultations or procedures which the PT has had in the interim are  addressed. The PT denies any adverse reactions to current medications since the last visit.  There are no new concerns. Vision is poor , unable to drive needs to schedule and keep eye exam Has not been taking his medication for fungal nail infection, understands the need to do so     Review of Systems See HPI Denies recent fever or chills. Denies sinus pressure, nasal congestion, ear pain or sore throat. Denies chest congestion, productive cough or wheezing. Denies chest pains, palpitations and leg swelling Denies abdominal pain, nausea, vomiting,diarrhea or constipation.   Denies dysuria, frequency, hesitancy or incontinence. Chronic back and knee pain with some limitation in mobility, ambulates with a cane, denies any falls Denies headaches, seizures, numbness, or tingling. Denies depression, anxiety or insomnia. Severe fungal infection of toenails and feet        Objective:   Physical Exam  Patient alert and oriented and in no cardiopulmonary distress.  HEENT: No facial asymmetry, EOMI, no sinus tenderness,  oropharynx pink and moist.  Neck supple no adenopathy.TM clear bilaterally  Chest: Clear to auscultation bilaterally.  CVS: S1, S2 systolic  murmur, no S3.Irregular heart rate  ABD: Soft non tender. Bowel sounds normal.  Ext: No edema  MS: Adequate though reduced  ROM spine, shoulders, hips and knees.  Skin: Intact, no ulcerations or rash noted.  Psych: Good eye contact, normal affect. Memory intact not anxious or depressed  appearing.  CNS: CN 2-12 intact, power, tone and sensation normal throughout.       Assessment & Plan:

## 2012-07-23 NOTE — Assessment & Plan Note (Signed)
Controlled, no change in medication DASH diet and commitment to daily physical activity for a minimum of 30 minutes discussed and encouraged, as a part of hypertension management. The importance of attaining a healthy weight is also discussed.  

## 2012-07-23 NOTE — Assessment & Plan Note (Signed)
Continue medication, clinically stable

## 2012-07-23 NOTE — Assessment & Plan Note (Signed)
Updated lab needed  Patient advised to reduce carb and sweets, commit to regular physical activity, take meds as prescribed, test blood as directed, and attempt to lose weight, to improve blood sugar control.  

## 2012-07-23 NOTE — Assessment & Plan Note (Signed)
Hyperlipidemia:Low fat diet discussed and encouraged.  Updated lab needed 

## 2012-07-23 NOTE — Addendum Note (Signed)
Addended by: Syliva Overman E on: 07/23/2012 01:00 PM   Modules accepted: Orders

## 2012-07-23 NOTE — Assessment & Plan Note (Signed)
Severe and unchanged, pt has not been taking med as prescribed, the importance of taking his med is stressed  Should have podiatry cut the nails, states he will let his son continue to do this

## 2012-09-02 ENCOUNTER — Other Ambulatory Visit: Payer: Self-pay | Admitting: Family Medicine

## 2012-10-24 ENCOUNTER — Other Ambulatory Visit: Payer: Self-pay

## 2012-12-25 ENCOUNTER — Ambulatory Visit: Payer: Medicare Other | Admitting: Family Medicine

## 2013-01-28 ENCOUNTER — Other Ambulatory Visit: Payer: Self-pay | Admitting: Family Medicine

## 2013-03-24 ENCOUNTER — Ambulatory Visit: Payer: Medicare Other | Admitting: Family Medicine

## 2013-03-31 ENCOUNTER — Other Ambulatory Visit: Payer: Self-pay | Admitting: Family Medicine

## 2013-04-01 ENCOUNTER — Other Ambulatory Visit: Payer: Self-pay | Admitting: Family Medicine

## 2013-04-01 DIAGNOSIS — D539 Nutritional anemia, unspecified: Secondary | ICD-10-CM | POA: Diagnosis not present

## 2013-04-01 DIAGNOSIS — D649 Anemia, unspecified: Secondary | ICD-10-CM | POA: Diagnosis not present

## 2013-04-01 DIAGNOSIS — E785 Hyperlipidemia, unspecified: Secondary | ICD-10-CM | POA: Diagnosis not present

## 2013-04-01 DIAGNOSIS — R5381 Other malaise: Secondary | ICD-10-CM | POA: Diagnosis not present

## 2013-04-01 DIAGNOSIS — Z125 Encounter for screening for malignant neoplasm of prostate: Secondary | ICD-10-CM | POA: Diagnosis not present

## 2013-04-01 DIAGNOSIS — E119 Type 2 diabetes mellitus without complications: Secondary | ICD-10-CM | POA: Diagnosis not present

## 2013-04-01 LAB — LIPID PANEL
HDL: 28 mg/dL — ABNORMAL LOW (ref >39)
LDL Cholesterol: 105 mg/dL — ABNORMAL HIGH (ref 0–99)
Total CHOL/HDL Ratio: 5.7 Ratio
Triglycerides: 128 mg/dL (ref <150)
VLDL: 26 mg/dL (ref 0–40)

## 2013-04-01 LAB — CBC WITH DIFFERENTIAL/PLATELET
Basophils Relative: 0 % (ref 0–1)
HCT: 30.6 % — ABNORMAL LOW (ref 39.0–52.0)
Hemoglobin: 10.4 g/dL — ABNORMAL LOW (ref 13.0–17.0)
Lymphocytes Relative: 35 % (ref 12–46)
MCHC: 34 g/dL (ref 30.0–36.0)
Monocytes Absolute: 0.5 10*3/uL (ref 0.1–1.0)
Monocytes Relative: 8 % (ref 3–12)
Neutro Abs: 3.8 10*3/uL (ref 1.7–7.7)
Platelets: 190 10*3/uL (ref 150–400)

## 2013-04-01 LAB — COMPLETE METABOLIC PANEL WITH GFR
Alkaline Phosphatase: 58 U/L (ref 39–117)
Calcium: 8.5 mg/dL (ref 8.4–10.5)
Chloride: 101 mEq/L (ref 96–112)
GFR, Est Non African American: 50 mL/min — ABNORMAL LOW
Glucose, Bld: 150 mg/dL — ABNORMAL HIGH (ref 70–99)
Sodium: 139 mEq/L (ref 135–145)
Total Bilirubin: 0.8 mg/dL (ref 0.3–1.2)
Total Protein: 7.2 g/dL (ref 6.0–8.3)

## 2013-04-02 LAB — FERRITIN: Ferritin: 19 ng/mL — ABNORMAL LOW (ref 22–322)

## 2013-04-22 ENCOUNTER — Ambulatory Visit (INDEPENDENT_AMBULATORY_CARE_PROVIDER_SITE_OTHER): Payer: Medicare Other | Admitting: Family Medicine

## 2013-04-22 ENCOUNTER — Encounter (INDEPENDENT_AMBULATORY_CARE_PROVIDER_SITE_OTHER): Payer: Self-pay

## 2013-04-22 VITALS — BP 146/82 | HR 82 | Resp 18 | Ht 69.0 in | Wt 221.1 lb

## 2013-04-22 DIAGNOSIS — I10 Essential (primary) hypertension: Secondary | ICD-10-CM | POA: Diagnosis not present

## 2013-04-22 DIAGNOSIS — B351 Tinea unguium: Secondary | ICD-10-CM

## 2013-04-22 DIAGNOSIS — E785 Hyperlipidemia, unspecified: Secondary | ICD-10-CM

## 2013-04-22 DIAGNOSIS — D509 Iron deficiency anemia, unspecified: Secondary | ICD-10-CM

## 2013-04-22 DIAGNOSIS — E1129 Type 2 diabetes mellitus with other diabetic kidney complication: Secondary | ICD-10-CM

## 2013-04-22 DIAGNOSIS — Z23 Encounter for immunization: Secondary | ICD-10-CM

## 2013-04-22 DIAGNOSIS — E538 Deficiency of other specified B group vitamins: Secondary | ICD-10-CM

## 2013-04-22 DIAGNOSIS — R972 Elevated prostate specific antigen [PSA]: Secondary | ICD-10-CM | POA: Insufficient documentation

## 2013-04-22 DIAGNOSIS — E119 Type 2 diabetes mellitus without complications: Secondary | ICD-10-CM

## 2013-04-22 DIAGNOSIS — M79609 Pain in unspecified limb: Secondary | ICD-10-CM

## 2013-04-22 DIAGNOSIS — F0390 Unspecified dementia without behavioral disturbance: Secondary | ICD-10-CM

## 2013-04-22 DIAGNOSIS — M79641 Pain in right hand: Secondary | ICD-10-CM

## 2013-04-22 MED ORDER — TERBINAFINE HCL 250 MG PO TABS: 250.0000 mg | ORAL_TABLET | Freq: Every day | ORAL | Status: AC

## 2013-04-22 MED ORDER — LOSARTAN POTASSIUM 25 MG PO TABS: 25.0000 mg | ORAL_TABLET | Freq: Every day | ORAL | Status: DC

## 2013-04-22 MED ORDER — CYANOCOBALAMIN 1000 MCG/ML IJ SOLN
1000.0000 ug | Freq: Once | INTRAMUSCULAR | Status: AC
  Administered 2013-04-22: 1000 ug via INTRAMUSCULAR

## 2013-04-22 NOTE — Progress Notes (Signed)
   Subjective:    Patient ID: Tyrone Hall, male    DOB: 25-Sep-1930, 80 y.o.   MRN: [id number]  HPI The PT is here for follow up and re-evaluation of chronic medical conditions, medication management and review of any available recent lab and radiology data.  Preventive health is updated, specifically  Cancer screening and Immunization.   Has missed several appts , "forgets" and labs were overdue for over 6 month, direct contact with his daughter made and she is present at visit. States  She though he had been keeping appts The PT denies any adverse reactions to current medications since the last visit. .  Son sends c/o thickened toenails, which is not new, requests re treatment with oral med Approx 3 week h/o acute right hand pain and inability to make a fist,  No known trauma     Review of Systems See HPI Denies recent fever or chills. Denies sinus pressure, nasal congestion, ear pain or sore throat. Denies chest congestion, productive cough or wheezing. Denies chest pains, palpitations and leg swelling Denies abdominal pain, nausea, vomiting,diarrhea or constipation.   Denies dysuria, frequency, hesitancy or incontinence.  Denies headaches, seizures, numbness, or tingling. Denies depression, anxiety or insomnia. Denies skin break down or rash.        Objective:   Physical Exam  Patient alert and oriented and in no cardiopulmonary distress.  HEENT: No facial asymmetry, EOMI, no sinus tenderness,  oropharynx pink and moist.  Neck decreased though adequate ROM,no adenopathy.  Chest: Clear to auscultation bilaterally.  CVS: S1, S2 no murmurs, no S3.  ABD: Soft non tender. Bowel sounds normal.  Ext: No edema  MS: Adequate though reduced  ROM spine, shoulders, hips and knees.Decreased strength in right hand , incapable of making a complete fist, tender over 3rd MP joint  Skin: Intact, no ulcerations or rash noted.  Psych: Good eye contact, normal affect. Memory loss  not anxious or depressed appearing.  CNS: CN 2-12 intact,        Assessment & Plan:

## 2013-04-22 NOTE — Patient Instructions (Addendum)
F/u in 3 month, Tuesday or Wednesday appt only   Once monthly  B12 injections to be restarted appt on a Tuesday or Wednesday  Flu vaccine and microalb today, alsoB12 injection  You are referred to Dr Ricki Miller for eye exam   You are referred to Dr Amedeo Plenty re right hand pain and weakness   You are referred to GI specialist to evaluate iron deficiency anemia  You will be referred to urologist re elevated PSA   New medications added today losartan 25mg  one daily, (blood pressure and kidneys)  Terbinafine one daily for fungal toenails  You need OTC iron 325mg  one daily , your iron is low  HBA1C , chem 7 and EFR , iron and ferritin in 3 month, non fasting

## 2013-04-24 LAB — MICROALBUMIN / CREATININE URINE RATIO
CREATININE, URINE: 355.8 mg/dL
Microalb Creat Ratio: 7.1 mg/g (ref 0.0–30.0)
Microalb, Ur: 2.54 mg/dL — ABNORMAL HIGH (ref 0.00–1.89)

## 2013-04-27 ENCOUNTER — Encounter: Payer: Self-pay | Admitting: Family Medicine

## 2013-04-27 NOTE — Assessment & Plan Note (Signed)
Resume daily iron supplement

## 2013-04-27 NOTE — Assessment & Plan Note (Signed)
ept to take oral lamisil also needs podiatry for toenail care

## 2013-04-27 NOTE — Assessment & Plan Note (Signed)
Recent onset in past 1 month, with weakness and inability to make a fist, refer to ortho for eval. No recent h/o trauma

## 2013-04-27 NOTE — Assessment & Plan Note (Signed)
Uncontrolled , add ARB

## 2013-04-27 NOTE — Assessment & Plan Note (Signed)
Urology to eval and follow

## 2013-04-27 NOTE — Assessment & Plan Note (Signed)
Improved, but still uncontrolled Hyperlipidemia:Low fat diet discussed and encouraged.  No med change

## 2013-04-27 NOTE — Assessment & Plan Note (Signed)
Needs to resume monthly B12 espescialy due to dementia diagnosis

## 2013-04-27 NOTE — Assessment & Plan Note (Signed)
Deteriorated Patient educated about the importance of limiting  Carbohydrate intake , the need to commit to daily physical activity for a minimum of 30 minutes , and to commit weight loss. The fact that changes in all these areas will reduce or eliminate all together the development of diabetes is stressed.    

## 2013-04-27 NOTE — Assessment & Plan Note (Signed)
Continue daily aricept 

## 2013-04-29 ENCOUNTER — Ambulatory Visit (INDEPENDENT_AMBULATORY_CARE_PROVIDER_SITE_OTHER): Payer: Medicare Other | Admitting: Gastroenterology

## 2013-04-29 ENCOUNTER — Encounter: Payer: Self-pay | Admitting: Gastroenterology

## 2013-04-29 VITALS — BP 141/83 | HR 78 | Temp 97.4°F | Wt 222.0 lb

## 2013-04-29 DIAGNOSIS — D509 Iron deficiency anemia, unspecified: Secondary | ICD-10-CM

## 2013-04-29 NOTE — Progress Notes (Signed)
Primary Care Physician:  Tula Nakayama, MD  Primary Gastroenterologist:  Garfield Cornea, MD   Chief Complaint  Patient presents with  . Anemia    HPI:  Tyrone Hall is a 80 y.o. male here at the request of Dr. Moshe Cipro for further evaluation of iron deficiency anemia. He was last seen back in 2009 for the same. At that time he underwent an EGD and colonoscopy as outlined below. He had evidence of healing ulcer. At that time he was on Celebrex. He was offered to small bowel capsule endoscopy but he failed to show for the study.  Patient recently had labs his PCP the indicated ongoing iron deficiency anemia. Hemoglobin essentially unchanged for the past 4 years. He has had microcytosis noted at least dating back to 2012. MCV was normal back in 2010. History of B12 deficiency, went 1 year without injections but recently resumed. Last B12 was 315 on 04/01/2013. BM twice per day. No melena, brbpr. No abdominal pain. Good appetite. No heartburn. No dysphagia. No weight loss. Hemoccult status unknown.  Current Outpatient Prescriptions  Medication Sig Dispense Refill  . amLODipine (NORVASC) 5 MG tablet TAKE ONE TABLET DAILY.  30 tablet  0  . aspirin (ASPIRIN LOW DOSE) 81 MG EC tablet Take 81 mg by mouth daily.       Marland Kitchen donepezil (ARICEPT) 10 MG tablet TAKE (1) TABLET BY MOUTH AT BEDTIME.  30 tablet  0  . losartan (COZAAR) 25 MG tablet Take 1 tablet (25 mg total) by mouth daily.  30 tablet  5  . lovastatin (MEVACOR) 40 MG tablet TAKE (1) TABLET BY MOUTH AT BEDTIME.  30 tablet  0  . metFORMIN (GLUCOPHAGE) 1000 MG tablet TAKE ONE TABLET BY MOUTH ONCE DAILY WITH BREAKFAST.  30 tablet  0  . terbinafine (LAMISIL) 250 MG tablet Take 1 tablet (250 mg total) by mouth daily.  30 tablet  3   No current facility-administered medications for this visit.    Allergies as of 04/29/2013  . (No Known Allergies)    Past Medical History  Diagnosis Date  . Coronary atherosclerosis of native coronary artery     Details not clear  . OA (osteoarthritis) of knee   . Type 2 diabetes mellitus   . Mixed hyperlipidemia   . Essential hypertension, benign   . TIA (transient ischemic attack)   . B12 deficiency   . Carotid artery disease     60-79% RICA and 62-37% LICA -  6/28  . Acute biliary pancreatitis   . Dementia   . Atrial fibrillation     Past Surgical History  Procedure Laterality Date  . Cholecystectomy    . Hemorrhoid surgery    . Common bile duct stone removal      s/p ercp with biliary and pancreatic stent placement and subsequent removal 2005  . Colonoscopy    01/20/2008    BTD:[id number], normal rectum, left-sided diverticula  . Esophagogastroduodenoscopy    01/20/2008    GYI:RSWNIO esophagus, small hiatal hernia, healing ulcer scar antrum . bx mild inflammation c/w with healed ulceration. no h.pylori, small bowel bx negative.    Family History  Problem Relation Age of Onset  . Diabetes type II    . Hypertension    . Colon cancer Neg Hx     History   Social History  . Marital Status: Divorced    Spouse Name: N/A    Number of Children: 2  . Years of Education: N/A   Occupational  History  . Retired   .     Social History Main Topics  . Smoking status: Former Smoker    Types: Cigarettes    Quit date: 05/18/1983  . Smokeless tobacco: Never Used  . Alcohol Use: No  . Drug Use: No  . Sexual Activity: Not on file   Other Topics Concern  . Not on file   Social History Narrative  . No narrative on file      ROS:  General: Negative for anorexia, weight loss, fever, chills, fatigue, weakness. Eyes: Negative for vision changes.  ENT: Negative for hoarseness, difficulty swallowing , nasal congestion. CV: Negative for chest pain, angina, palpitations, dyspnea on exertion, peripheral edema.  Respiratory: Negative for dyspnea at rest, dyspnea on exertion, cough, sputum, wheezing.  GI: See history of present illness. GU:  Negative for dysuria, hematuria, urinary  incontinence, urinary frequency, nocturnal urination.  MS: Negative for joint pain, low back pain.  Derm: Negative for rash or itching.  Neuro: Negative for weakness, abnormal sensation, seizure, frequent headaches, memory loss, confusion.  Psych: Negative for anxiety, depression, suicidal ideation, hallucinations.  Endo: Negative for unusual weight change.  Heme: Negative for bruising or bleeding. Allergy: Negative for rash or hives.    Physical Examination:  BP 141/83  Pulse 78  Temp(Src) 97.4 F (36.3 C) (Oral)  Wt 222 lb (100.699 kg)   General: Well-nourished, well-developed in no acute distress. Accompanied by daughter Head: Normocephalic, atraumatic.   Eyes: Conjunctiva pink, no icterus. Mouth: Oropharyngeal mucosa moist and pink , no lesions erythema or exudate. Neck: Supple without thyromegaly, masses, or lymphadenopathy.  Lungs: Clear to auscultation bilaterally.  Heart: Regular rate and rhythm, no murmurs rubs or gallops.  Abdomen: Bowel sounds are normal, nontender, nondistended, no hepatosplenomegaly or masses, no abdominal bruits or    hernia , no rebound or guarding.   Rectal: not performed Extremities: No lower extremity edema. No clubbing or deformities.  Neuro: Alert and oriented x 4 , grossly normal neurologically.  Skin: Warm and dry, no rash or jaundice.   Psych: Alert and cooperative, normal mood and affect.  Labs: Lab Results  Component Value Date   WBC 6.8 04/01/2013   HGB 10.4* 04/01/2013   HCT 30.6* 04/01/2013   MCV 68.6* 04/01/2013   PLT 190 04/01/2013   Lab Results  Component Value Date   TSH 0.661 04/01/2013   Lab Results  Component Value Date   HGBA1C 7.4* 04/01/2013   Lab Results  Component Value Date   IRON 46 04/01/2013   FERRITIN 19* 04/01/2013   Lab Results  Component Value Date   VITAMINB12 315 04/01/2013   Lab Results  Component Value Date   CREATININE 1.34 04/01/2013   BUN 6 04/01/2013   NA 139 04/01/2013   K 3.5  04/01/2013   CL 101 04/01/2013   CO2 27 04/01/2013   Lab Results  Component Value Date   ALT 10 04/01/2013   AST 17 04/01/2013   ALKPHOS 58 04/01/2013   BILITOT 0.8 04/01/2013     Imaging Studies: No results found.

## 2013-04-29 NOTE — Assessment & Plan Note (Signed)
Ongoing IDA. Hemoccult status unknown. Collect ifobt. Will likely need colonoscopy and EGD again since last look was five years ago. Further recommendations to follow.

## 2013-04-29 NOTE — Patient Instructions (Signed)
1. Please collect stool specimen and return to our office. Further recommendations to follow.

## 2013-04-30 NOTE — Progress Notes (Signed)
cc'd to pcp 

## 2013-05-13 ENCOUNTER — Ambulatory Visit (INDEPENDENT_AMBULATORY_CARE_PROVIDER_SITE_OTHER): Payer: Medicare Other | Admitting: Urology

## 2013-05-13 DIAGNOSIS — N471 Phimosis: Secondary | ICD-10-CM | POA: Diagnosis not present

## 2013-05-13 DIAGNOSIS — N478 Other disorders of prepuce: Secondary | ICD-10-CM | POA: Diagnosis not present

## 2013-05-13 DIAGNOSIS — R972 Elevated prostate specific antigen [PSA]: Secondary | ICD-10-CM | POA: Diagnosis not present

## 2013-05-14 ENCOUNTER — Telehealth: Payer: Self-pay | Admitting: Family Medicine

## 2013-05-14 DIAGNOSIS — I350 Nonrheumatic aortic (valve) stenosis: Secondary | ICD-10-CM

## 2013-05-14 DIAGNOSIS — H3410 Central retinal artery occlusion, unspecified eye: Secondary | ICD-10-CM | POA: Diagnosis not present

## 2013-05-14 DIAGNOSIS — I4891 Unspecified atrial fibrillation: Secondary | ICD-10-CM

## 2013-05-14 DIAGNOSIS — H2589 Other age-related cataract: Secondary | ICD-10-CM | POA: Diagnosis not present

## 2013-05-14 LAB — HM DIABETES EYE EXAM

## 2013-05-14 NOTE — Telephone Encounter (Signed)
Pls contact pt's daughter or son, probably daughter is taking him to most appts now. Let them I know I am happy that he saw the urologist nad has a good report. Pls also explain that his follow up with cardiology for irregular heart rate was last in 2012, so it is past time to go back to Dr Domenic Polite an this was also recommended out of concern by the urologist, so please ask that he   Does  Go, and I have entered the referral

## 2013-05-20 ENCOUNTER — Ambulatory Visit: Payer: Medicare Other

## 2013-05-20 ENCOUNTER — Ambulatory Visit (INDEPENDENT_AMBULATORY_CARE_PROVIDER_SITE_OTHER): Payer: Medicare Other

## 2013-05-20 VITALS — BP 130/80 | Wt 223.8 lb

## 2013-05-20 DIAGNOSIS — E538 Deficiency of other specified B group vitamins: Secondary | ICD-10-CM | POA: Diagnosis not present

## 2013-05-20 MED ORDER — CYANOCOBALAMIN 1000 MCG/ML IJ SOLN
1000.0000 ug | Freq: Once | INTRAMUSCULAR | Status: AC
  Administered 2013-05-20: 1000 ug via INTRAMUSCULAR

## 2013-05-20 NOTE — Progress Notes (Signed)
Patient in to get his monthly b12 injection, Received in his left deltoid with no sign of complications

## 2013-05-22 ENCOUNTER — Ambulatory Visit: Payer: Medicare Other | Admitting: Cardiology

## 2013-06-02 ENCOUNTER — Other Ambulatory Visit: Payer: Self-pay | Admitting: Family Medicine

## 2013-06-10 ENCOUNTER — Ambulatory Visit: Payer: Medicare Other | Admitting: Cardiology

## 2013-06-11 ENCOUNTER — Telehealth: Payer: Self-pay | Admitting: Gastroenterology

## 2013-06-11 NOTE — Telephone Encounter (Signed)
Patient never had his ifobt done??? Please have him complete.  He also needs to take ferrous sulfate 325mg  BID.

## 2013-06-13 NOTE — Telephone Encounter (Signed)
Tried to call pt- LMOM 

## 2013-06-16 NOTE — Telephone Encounter (Signed)
Tried to call pt- LMOM 

## 2013-06-17 NOTE — Telephone Encounter (Signed)
Mailed letter °

## 2013-07-08 ENCOUNTER — Encounter (INDEPENDENT_AMBULATORY_CARE_PROVIDER_SITE_OTHER): Payer: Self-pay

## 2013-07-08 ENCOUNTER — Ambulatory Visit (INDEPENDENT_AMBULATORY_CARE_PROVIDER_SITE_OTHER): Payer: Medicare Other | Admitting: Cardiology

## 2013-07-08 ENCOUNTER — Encounter: Payer: Self-pay | Admitting: Cardiology

## 2013-07-08 VITALS — BP 144/77 | HR 72 | Ht 68.0 in

## 2013-07-08 DIAGNOSIS — I4891 Unspecified atrial fibrillation: Secondary | ICD-10-CM

## 2013-07-08 DIAGNOSIS — I350 Nonrheumatic aortic (valve) stenosis: Secondary | ICD-10-CM

## 2013-07-08 DIAGNOSIS — I2510 Atherosclerotic heart disease of native coronary artery without angina pectoris: Secondary | ICD-10-CM

## 2013-07-08 DIAGNOSIS — I359 Nonrheumatic aortic valve disorder, unspecified: Secondary | ICD-10-CM

## 2013-07-08 NOTE — Assessment & Plan Note (Signed)
Noted per history, but details are not clear. He does not endorse any angina symptoms. Currently on aspirin, Norvasc, Mevacor and Cozaar.

## 2013-07-08 NOTE — Progress Notes (Signed)
Clinical Summary Tyrone Hall is an 80 y.o.male last seen in the office in May 2012. At that time he was seen for atrial fibrillation of uncertain duration, and declined anticoagulation after a full discussion with the patient and his daughter. He is referred back to the office by Dr. Moshe Cipro for routine followup.  He is here with his daughter today. He does not voice any complaints of palpitations or chest pain. Says that he has been slowing down over the years, he is using a cane to steady himself. Denies any recent falls. He is limited by arthritic knee pain.  Lab work from December 2014 showed potassium 3.5, BUN 6, creatinine 1.3, hemoglobin 10.4, platelets 190, cholesterol 159, triglycerides 128, HDL 28, LDL 105, TSH 0.66.  Echocardiogram from May 2012 demonstrated moderate LVH with LVEF 60-65%, basal inferior hypokinesis, functionally bicuspid aortic valve with mild stenosis, mild left atrial enlargement.  ECG today shows atrial fibrillation with nonspecific ST-T changes and rightward axis.   No Known Allergies  Current Outpatient Prescriptions  Medication Sig Dispense Refill  . amLODipine (NORVASC) 5 MG tablet TAKE ONE TABLET DAILY.  30 tablet  2  . aspirin (ASPIRIN LOW DOSE) 81 MG EC tablet Take 81 mg by mouth daily.       Marland Kitchen donepezil (ARICEPT) 10 MG tablet TAKE (1) TABLET BY MOUTH AT BEDTIME.  30 tablet  2  . losartan (COZAAR) 25 MG tablet Take 1 tablet (25 mg total) by mouth daily.  30 tablet  5  . lovastatin (MEVACOR) 40 MG tablet TAKE (1) TABLET BY MOUTH AT BEDTIME.  30 tablet  2  . metFORMIN (GLUCOPHAGE) 1000 MG tablet TAKE ONE TABLET BY MOUTH ONCE DAILY WITH BREAKFAST.  30 tablet  2  . terbinafine (LAMISIL) 250 MG tablet Take 1 tablet (250 mg total) by mouth daily.  30 tablet  3   No current facility-administered medications for this visit.    Past Medical History  Diagnosis Date  . Coronary atherosclerosis of native coronary artery     Details not clear  . OA  (osteoarthritis) of knee   . Type 2 diabetes mellitus   . Mixed hyperlipidemia   . Essential hypertension, benign   . TIA (transient ischemic attack)   . B12 deficiency   . Carotid artery disease     60-79% RICA and 50-93% LICA -  2/67  . Acute biliary pancreatitis   . Dementia   . Atrial fibrillation     Social History Tyrone Hall reports that he quit smoking about 30 years ago. His smoking use included Cigarettes. He smoked 0.00 packs per day. He has never used smokeless tobacco. Tyrone Hall reports that he does not drink alcohol.  Review of Systems Negative except as outlined above.  Physical Examination Filed Vitals:   07/08/13 1046  BP: 144/77  Pulse: 72   Overweight male,. Appears comfortable at rest. HEENT: Conjunctiva and lids normal, oropharynx clear. Neck: Supple, no elevated JVP, no thyromegaly. Lungs: Clear to auscultation, nonlabored breathing at rest. Cardiac: Irregularly irregular, no S3, 2/6 systolic murmur, no pericardial rub. Abdomen: Soft, nontender, bowel sounds present. Extremities: No pitting edema, distal pulses 2+. Skin: Warm and dry. Musculoskeletal: No kyphosis. Neuropsychiatric: Alert and oriented x3, affect grossly appropriate.   Problem List and Plan   Atrial fibrillation Permanent, rate controlled without specific medical intervention. He has declined anticoagulation as noted previously, does continue on aspirin however. Continue observation.  Aortic stenosis Functionally bicuspid aortic valve with mild stenosis  by echocardiogram 2012. No change in examination.  Coronary atherosclerosis of native coronary artery Noted per history, but details are not clear. He does not endorse any angina symptoms. Currently on aspirin, Norvasc, Mevacor and Cozaar.    Satira Sark, M.D., F.A.C.C.

## 2013-07-08 NOTE — Assessment & Plan Note (Signed)
Permanent, rate controlled without specific medical intervention. He has declined anticoagulation as noted previously, does continue on aspirin however. Continue observation.

## 2013-07-08 NOTE — Patient Instructions (Addendum)
Your physician wants you to follow-up in: 6 months You will receive a reminder letter in the mail two months in advance. If you don't receive a letter, please call our office to schedule the follow-up appointment.     Your physician recommends that you continue on your current medications as directed. Please refer to the Current Medication list given to you today.      Thank you for choosing [facility] !        

## 2013-07-08 NOTE — Assessment & Plan Note (Signed)
Functionally bicuspid aortic valve with mild stenosis by echocardiogram 2012. No change in examination.

## 2013-07-23 ENCOUNTER — Other Ambulatory Visit: Payer: Self-pay | Admitting: *Deleted

## 2013-07-23 ENCOUNTER — Ambulatory Visit (INDEPENDENT_AMBULATORY_CARE_PROVIDER_SITE_OTHER): Payer: Medicare Other | Admitting: Family Medicine

## 2013-07-23 ENCOUNTER — Encounter: Payer: Self-pay | Admitting: Family Medicine

## 2013-07-23 VITALS — BP 154/82 | HR 85 | Resp 18 | Ht 69.0 in | Wt 227.0 lb

## 2013-07-23 DIAGNOSIS — I2510 Atherosclerotic heart disease of native coronary artery without angina pectoris: Secondary | ICD-10-CM | POA: Diagnosis not present

## 2013-07-23 DIAGNOSIS — R0600 Dyspnea, unspecified: Secondary | ICD-10-CM | POA: Insufficient documentation

## 2013-07-23 DIAGNOSIS — E538 Deficiency of other specified B group vitamins: Secondary | ICD-10-CM

## 2013-07-23 DIAGNOSIS — D509 Iron deficiency anemia, unspecified: Secondary | ICD-10-CM | POA: Diagnosis not present

## 2013-07-23 DIAGNOSIS — R0989 Other specified symptoms and signs involving the circulatory and respiratory systems: Secondary | ICD-10-CM

## 2013-07-23 DIAGNOSIS — E785 Hyperlipidemia, unspecified: Secondary | ICD-10-CM

## 2013-07-23 DIAGNOSIS — I6529 Occlusion and stenosis of unspecified carotid artery: Secondary | ICD-10-CM

## 2013-07-23 DIAGNOSIS — M1710 Unilateral primary osteoarthritis, unspecified knee: Secondary | ICD-10-CM

## 2013-07-23 DIAGNOSIS — F0390 Unspecified dementia without behavioral disturbance: Secondary | ICD-10-CM

## 2013-07-23 DIAGNOSIS — N058 Unspecified nephritic syndrome with other morphologic changes: Secondary | ICD-10-CM

## 2013-07-23 DIAGNOSIS — R0609 Other forms of dyspnea: Secondary | ICD-10-CM

## 2013-07-23 DIAGNOSIS — I38 Endocarditis, valve unspecified: Secondary | ICD-10-CM

## 2013-07-23 DIAGNOSIS — E1129 Type 2 diabetes mellitus with other diabetic kidney complication: Secondary | ICD-10-CM

## 2013-07-23 DIAGNOSIS — IMO0002 Reserved for concepts with insufficient information to code with codable children: Secondary | ICD-10-CM

## 2013-07-23 DIAGNOSIS — R972 Elevated prostate specific antigen [PSA]: Secondary | ICD-10-CM

## 2013-07-23 DIAGNOSIS — I10 Essential (primary) hypertension: Secondary | ICD-10-CM

## 2013-07-23 DIAGNOSIS — E1121 Type 2 diabetes mellitus with diabetic nephropathy: Secondary | ICD-10-CM

## 2013-07-23 LAB — CBC
HEMATOCRIT: 27.2 % — AB (ref 39.0–52.0)
Hemoglobin: 9 g/dL — ABNORMAL LOW (ref 13.0–17.0)
MCH: 22.1 pg — ABNORMAL LOW (ref 26.0–34.0)
MCHC: 33.1 g/dL (ref 30.0–36.0)
MCV: 66.8 fL — ABNORMAL LOW (ref 78.0–100.0)
PLATELETS: 227 10*3/uL (ref 150–400)
RBC: 4.07 MIL/uL — ABNORMAL LOW (ref 4.22–5.81)
RDW: 19 % — AB (ref 11.5–15.5)
WBC: 6.7 10*3/uL (ref 4.0–10.5)

## 2013-07-23 MED ORDER — CYANOCOBALAMIN 1000 MCG/ML IJ SOLN
1000.0000 ug | Freq: Once | INTRAMUSCULAR | Status: AC
  Administered 2013-07-23: 1000 ug via INTRAMUSCULAR

## 2013-07-23 NOTE — Patient Instructions (Addendum)
Annual wellness in 4 month, call if you need me before  Foot exam is better but  you need to examine every day, also continue tablets once daily for fungal infection  B12 today and every 4 weeks   Check your pharmacy for the shingles vaccine you need this  Pl sign for eye exam  CXR today and labs today are HBa1C, cmp and EGFR, cbc and iron , ferritin and B12 level  You are referred to cardiology to evaluate new onset shortness of breath

## 2013-07-24 LAB — IRON: IRON: 17 ug/dL — AB (ref 42–165)

## 2013-07-24 LAB — COMPLETE METABOLIC PANEL WITH GFR
ALBUMIN: 3.6 g/dL (ref 3.5–5.2)
ALT: 9 U/L (ref 0–53)
AST: 16 U/L (ref 0–37)
Alkaline Phosphatase: 73 U/L (ref 39–117)
BUN: 9 mg/dL (ref 6–23)
CALCIUM: 8.7 mg/dL (ref 8.4–10.5)
CO2: 24 meq/L (ref 19–32)
CREATININE: 1.34 mg/dL (ref 0.50–1.35)
Chloride: 103 mEq/L (ref 96–112)
GFR, EST AFRICAN AMERICAN: 57 mL/min — AB
GFR, EST NON AFRICAN AMERICAN: 50 mL/min — AB
Glucose, Bld: 116 mg/dL — ABNORMAL HIGH (ref 70–99)
Potassium: 4.1 mEq/L (ref 3.5–5.3)
Sodium: 138 mEq/L (ref 135–145)
Total Bilirubin: 0.7 mg/dL (ref 0.2–1.2)
Total Protein: 7.2 g/dL (ref 6.0–8.3)

## 2013-07-24 LAB — FERRITIN: FERRITIN: 45 ng/mL (ref 22–322)

## 2013-07-24 LAB — VITAMIN B12

## 2013-07-24 LAB — HEMOGLOBIN A1C
Hgb A1c MFr Bld: 7.3 % — ABNORMAL HIGH (ref <5.7)
Mean Plasma Glucose: 163 mg/dL — ABNORMAL HIGH (ref <117)

## 2013-07-24 NOTE — Progress Notes (Signed)
Subjective:    Patient ID: Tyrone Hall, male    DOB: [birth year], 80 y.o.   MRN: [id number]  HPI The PT is here for follow up and re-evaluation of chronic medical conditions, medication management and review of any available recent lab and radiology data.  Preventive health is updated, specifically  Cancer screening and Immunization.   Questions or concerns regarding consultations or procedures which the PT has had in the interim are  Addressed.Has seen urologist and cardiologist . Has upcoming eye exam. Hs not been getting B12 injections on a monthly basis as he should, this is reviewed with his daughter The PT denies any adverse reactions to current medications since the last visit.  2 week h/o excessive shortness of breath with minimal activity, denies PND , orthopnea or leg swelling. Though recently saw cardiology, new symptom, in light of risk factors and valvular heart disease warrant a return for further evaluation. Noted to be "excessively flirty" by daughter and also noted to have spontaneous chewing of mouth and rolling of tongue which she finds irritating Denies poyuria, or hypoglycemia, gets around by public transport and by neighbors when his daughter is at work Eats breakfast at [employer] and lunch at [employer], cookies for supper and will eat steak from his daughter when she cooks this      Review of Systems See HPI. Denies recent fever or chills. Denies sinus pressure, nasal congestion, ear pain or sore throat. Denies chest congestion, productive cough or wheezing.2 week h/o exertional fatigue Denies chest pains, palpitations, PND, orthopnea and leg swelling Denies abdominal pain, nausea, vomiting,diarrhea or constipation.   Denies dysuria, frequency, hesitancy or incontinence Uses a cane for safe ambulation, and denies any buckling or near falls, does experience stiffness at times Denies headaches or  seizures,  Denies depression, anxiety or insomnia.         Objective:   Physical Exam BP 154/82  Pulse 85  Resp 18  Ht 5\' 9"  (1.753 m)  Wt 227 lb 0.6 oz (102.985 kg)  BMI 33.51 kg/m2  SpO2 96% Patient alert and oriented and in no cardiopulmonary distress.  HEENT: No facial asymmetry, EOMI, no sinus tenderness,  oropharynx pink and moist.  Neck supple no adenopathy.  Chest: Clear to auscultation bilaterally.  CVS: S1, S2 systolic murmur, no S3.  ABD: Soft non tender. Bowel sounds normal.  Ext: No edema  MS: Adequate though reduced ROM spine, shoulders, hips and knees.  Skin: Intact, no ulcerations or rash noted.  Psych: Good eye contact, normal affect. Memory loss, slightly hyper with mildly inappropriate "flirty " type of behavior  CNS: CN 2-12 intact, power, tone  normal throughout.        Assessment & Plan:  Essential hypertension, benign Sub optimal control, however based on patient's age , will hold off on med increase at this voisit, he is encouraged to work on reducing salt in diet and weight loss Will increase cozaar at next visit if still elevated  Dyspnea 2 week h/o progressive dyspnea, no symptoms or signs of heart failure. May need re imaging of heart structure , none in over 3 years , will refer back to cardiology for consideration of this based on current symptoms. CXr today  HYPERLIPIDEMIA Uncontrolled Hyperlipidemia:Low fat diet discussed and encouraged.  Updated lab needed at/ before next visit.   OSTEOARTHRITIS, KNEE, SEVERE Fall safety issues discussed. Pt to continue to use cane and home safety literature provided  SENILE DEMENTIA, UNCOMPLICATED Appears to be deteriorating in  that his behavior appears somewhat inappropriate at times. Clothing also not as clean as it has been in the past, may need toaddress this with his daughter if recurrs Need to keep on monthly B12  Diabetes mellitus with kidney disease Updated lab needed at/ before next visit. Patient advised to reduce carb and sweets,  commit to regular physical activity, take meds as prescribed, test blood as directed, and attempt to lose weight, to improve blood sugar control.

## 2013-07-24 NOTE — Assessment & Plan Note (Signed)
Fall safety issues discussed. Pt to continue to use cane and home safety literature provided

## 2013-07-24 NOTE — Assessment & Plan Note (Signed)
2 week h/o progressive dyspnea, no symptoms or signs of heart failure. May need re imaging of heart structure , none in over 3 years , will refer back to cardiology for consideration of this based on current symptoms. CXr today

## 2013-07-24 NOTE — Assessment & Plan Note (Signed)
Updated lab needed at/ before next visit. Patient advised to reduce carb and sweets, commit to regular physical activity, take meds as prescribed, test blood as directed, and attempt to lose weight, to improve blood sugar control.  

## 2013-07-24 NOTE — Assessment & Plan Note (Signed)
Sub optimal control, however based on patient's age , will hold off on med increase at this voisit, he is encouraged to work on reducing salt in diet and weight loss Will increase cozaar at next visit if still elevated

## 2013-07-24 NOTE — Assessment & Plan Note (Signed)
Appears to be deteriorating in that his behavior appears somewhat inappropriate at times. Clothing also not as clean as it has been in the past, may need toaddress this with his daughter if recurrs Need to keep on monthly B12

## 2013-07-24 NOTE — Assessment & Plan Note (Signed)
Uncontrolled. Hyperlipidemia:Low fat diet discussed and encouraged.  Updated lab needed at/ before next visit.  

## 2013-07-29 ENCOUNTER — Ambulatory Visit (HOSPITAL_COMMUNITY)
Admission: RE | Admit: 2013-07-29 | Discharge: 2013-07-29 | Disposition: A | Discharge status: Home / Self Care | Payer: Medicare Other | Source: Ambulatory Visit | Attending: Family Medicine | Admitting: Family Medicine

## 2013-07-29 DIAGNOSIS — E119 Type 2 diabetes mellitus without complications: Secondary | ICD-10-CM | POA: Diagnosis not present

## 2013-07-29 DIAGNOSIS — I10 Essential (primary) hypertension: Secondary | ICD-10-CM | POA: Diagnosis not present

## 2013-07-29 DIAGNOSIS — J811 Chronic pulmonary edema: Secondary | ICD-10-CM | POA: Diagnosis not present

## 2013-07-29 DIAGNOSIS — G459 Transient cerebral ischemic attack, unspecified: Secondary | ICD-10-CM | POA: Insufficient documentation

## 2013-07-29 DIAGNOSIS — R0602 Shortness of breath: Secondary | ICD-10-CM | POA: Diagnosis not present

## 2013-07-29 DIAGNOSIS — R918 Other nonspecific abnormal finding of lung field: Secondary | ICD-10-CM | POA: Diagnosis not present

## 2013-07-29 DIAGNOSIS — I4891 Unspecified atrial fibrillation: Secondary | ICD-10-CM | POA: Insufficient documentation

## 2013-07-29 DIAGNOSIS — R0600 Dyspnea, unspecified: Secondary | ICD-10-CM

## 2013-07-29 DIAGNOSIS — I2510 Atherosclerotic heart disease of native coronary artery without angina pectoris: Secondary | ICD-10-CM | POA: Insufficient documentation

## 2013-07-29 DIAGNOSIS — J984 Other disorders of lung: Secondary | ICD-10-CM | POA: Diagnosis not present

## 2013-07-29 DIAGNOSIS — E785 Hyperlipidemia, unspecified: Secondary | ICD-10-CM | POA: Diagnosis not present

## 2013-07-30 ENCOUNTER — Telehealth: Payer: Self-pay

## 2013-07-30 ENCOUNTER — Encounter: Payer: Self-pay | Admitting: Adult Health

## 2013-07-30 ENCOUNTER — Telehealth: Payer: Self-pay | Admitting: Family Medicine

## 2013-07-30 ENCOUNTER — Ambulatory Visit (INDEPENDENT_AMBULATORY_CARE_PROVIDER_SITE_OTHER): Payer: Medicare Other | Admitting: Adult Health

## 2013-07-30 VITALS — BP 144/78 | HR 84 | Ht 68.0 in | Wt 223.0 lb

## 2013-07-30 DIAGNOSIS — R0600 Dyspnea, unspecified: Secondary | ICD-10-CM

## 2013-07-30 DIAGNOSIS — I4891 Unspecified atrial fibrillation: Secondary | ICD-10-CM

## 2013-07-30 DIAGNOSIS — R0989 Other specified symptoms and signs involving the circulatory and respiratory systems: Secondary | ICD-10-CM

## 2013-07-30 DIAGNOSIS — I2510 Atherosclerotic heart disease of native coronary artery without angina pectoris: Secondary | ICD-10-CM | POA: Diagnosis not present

## 2013-07-30 DIAGNOSIS — I10 Essential (primary) hypertension: Secondary | ICD-10-CM

## 2013-07-30 DIAGNOSIS — R0609 Other forms of dyspnea: Secondary | ICD-10-CM

## 2013-07-30 DIAGNOSIS — I359 Nonrheumatic aortic valve disorder, unspecified: Secondary | ICD-10-CM

## 2013-07-30 DIAGNOSIS — I38 Endocarditis, valve unspecified: Secondary | ICD-10-CM

## 2013-07-30 DIAGNOSIS — I350 Nonrheumatic aortic (valve) stenosis: Secondary | ICD-10-CM

## 2013-07-30 DIAGNOSIS — M79609 Pain in unspecified limb: Secondary | ICD-10-CM | POA: Diagnosis not present

## 2013-07-30 NOTE — Assessment & Plan Note (Signed)
Heart rate is well-controlled. He is currently not on any anticoagulation therapy. He remains in normal sinus rhythm.

## 2013-07-30 NOTE — Telephone Encounter (Signed)
Will research where patient can be referred to instead.

## 2013-07-30 NOTE — Progress Notes (Signed)
HPI: Tyrone Hall is a 80 year old patient of Dr. Domenic Polite her on for ongoing assessment and management of atrial fibrillation, cardiac valve stenosis, with history of CAD. Patient was last seen in the office in March of 2015 by Dr. Domenic Polite. The patient has had no changes in his regimen on that visit.   The patient recently seen by Dr. Moshe Cipro on 07/24/2013 at that time his blood pressure was not well-controlled, and had progressive dyspnea. According to Dr. Griffin Dakin note the patient was noncompliant with diet eating a good bit of salty foods and snacks. He was also having an appropriate psychological behavior per Dr. Moshe Cipro. She did not make any changes in his medication regimen for CBC, cemented, hemoglobin A1c and other multiple labs were ordered along with chest x-ray.   Labs reveal sodium of 138 potassium 4.1 chloride 103 CO2 24 BUN 9 creatinine 1.3 glucose 116. Hemoglobin 9.0 hematocrit 27.2 hemoglobin A1c 7.3. Chest x-ray revealed Larsen the cardiac silhouette, nodular density vs. infiltrate or artifact in the left upper lobe due to the presence of questionable artifact vs. nodular density or minimal infiltrate focused on the upper lobe recommend followup chest x-ray in 2-4 weeks. The findings could represent asymptomatic edema vs. pneumonia in the right upper lobe.   Is completely asymptomatic. He denies any worsening shortness of breath cough congestion fever. He has had no chest pain palpitations. He remains active. When he gets tired, he rests.   No Known Allergies  Current Outpatient Prescriptions  Medication Sig Dispense Refill  . amLODipine (NORVASC) 5 MG tablet TAKE ONE TABLET DAILY.  30 tablet  2  . aspirin (ASPIRIN LOW DOSE) 81 MG EC tablet Take 81 mg by mouth daily.       Marland Kitchen donepezil (ARICEPT) 10 MG tablet TAKE (1) TABLET BY MOUTH AT BEDTIME.  30 tablet  2  . losartan (COZAAR) 25 MG tablet Take 1 tablet (25 mg total) by mouth daily.  30 tablet  5  . lovastatin (MEVACOR) 40 MG  tablet TAKE (1) TABLET BY MOUTH AT BEDTIME.  30 tablet  2  . metFORMIN (GLUCOPHAGE) 1000 MG tablet TAKE ONE TABLET BY MOUTH ONCE DAILY WITH BREAKFAST.  30 tablet  2  . terbinafine (LAMISIL) 250 MG tablet Take 1 tablet (250 mg total) by mouth daily.  30 tablet  3   No current facility-administered medications for this visit.    Past Medical History  Diagnosis Date  . Coronary atherosclerosis of native coronary artery     Details not clear  . OA (osteoarthritis) of knee   . Type 2 diabetes mellitus   . Mixed hyperlipidemia   . Essential hypertension, benign   . TIA (transient ischemic attack)   . B12 deficiency   . Carotid artery disease     60-79% RICA and 35-00% LICA -  9/38  . Acute biliary pancreatitis   . Dementia   . Atrial fibrillation     Past Surgical History  Procedure Laterality Date  . Cholecystectomy    . Hemorrhoid surgery    . Common bile duct stone removal      s/p ercp with biliary and pancreatic stent placement and subsequent removal 2005  . Colonoscopy    01/20/2008    HWE:[id number], normal rectum, left-sided diverticula  . Esophagogastroduodenoscopy    01/20/2008    FYB:OFBPZW esophagus, small hiatal hernia, healing ulcer scar antrum . bx mild inflammation c/w with healed ulceration. no h.pylori, small bowel bx negative.    ROS:  Review of systems complete and found to be negative unless listed above  PHYSICAL EXAM BP 144/78  Pulse 84  Ht 5\' 8"  (1.727 m)  Wt 223 lb (101.152 kg)  BMI 33.91 kg/m2  General: Well developed, well nourished, in no acute distress Head: Eyes PERRLA, No xanthomas.   Normal cephalic and atramatic  Lungs: Clear bilaterally to auscultation and percussion. Heart: HRRR S1 S2, with 2/6 systolic murmur.  Pulses are 2+ & equal.            No carotid bruit. No JVD.  No abdominal bruits. No femoral bruits. Abdomen: Bowel sounds are positive, abdomen soft and non-tender without masses or                  Hernia's noted. Msk:  Back  normal, normal gait. Normal strength and tone for age. Extremities: No clubbing, cyanosis or edema.  DP +1 Neuro: Alert and oriented X 3. Psych:  Good affect, responds appropriately     ASSESSMENT AND PLAN

## 2013-07-30 NOTE — Progress Notes (Deleted)
Name: Tyrone Hall    DOB: [birth year]  Age: 80 y.o.  MR#: [id number]       PCP:  Tula Nakayama, MD      Insurance: Payor: MEDICARE / Plan: MEDICARE PART A AND B / Product Type: *No Product type* /   CC:    Chief Complaint  Patient presents with  . Atrial Fibrillation  . Aortic Stenosis  . Coronary Artery Disease    VS Filed Vitals:   07/30/13 1406  BP: 144/78  Pulse: 84  Height: 5' 8"  (1.727 m)  Weight: 223 lb (101.152 kg)    Weights Current Weight  07/30/13 223 lb (101.152 kg)  07/23/13 227 lb 0.6 oz (102.985 kg)  05/20/13 223 lb 12.8 oz (101.515 kg)    Blood Pressure  BP Readings from Last 3 Encounters:  07/30/13 144/78  07/23/13 154/82  07/08/13 144/77     Admit date:  (Not on file) Last encounter with RMR:  Visit date not found   Allergy Review of patient's allergies indicates no known allergies.  Current Outpatient Prescriptions  Medication Sig Dispense Refill  . amLODipine (NORVASC) 5 MG tablet TAKE ONE TABLET DAILY.  30 tablet  2  . aspirin (ASPIRIN LOW DOSE) 81 MG EC tablet Take 81 mg by mouth daily.       Marland Kitchen donepezil (ARICEPT) 10 MG tablet TAKE (1) TABLET BY MOUTH AT BEDTIME.  30 tablet  2  . losartan (COZAAR) 25 MG tablet Take 1 tablet (25 mg total) by mouth daily.  30 tablet  5  . lovastatin (MEVACOR) 40 MG tablet TAKE (1) TABLET BY MOUTH AT BEDTIME.  30 tablet  2  . metFORMIN (GLUCOPHAGE) 1000 MG tablet TAKE ONE TABLET BY MOUTH ONCE DAILY WITH BREAKFAST.  30 tablet  2  . terbinafine (LAMISIL) 250 MG tablet Take 1 tablet (250 mg total) by mouth daily.  30 tablet  3   No current facility-administered medications for this visit.    Discontinued Meds:   There are no discontinued medications.  Patient Active Problem List   Diagnosis Date Noted  . Dyspnea 07/23/2013  . Right hand pain 04/22/2013  . Elevated PSA 04/22/2013  . Onychomycosis 03/06/2012  . Aortic stenosis 09/15/2010  . Atrial fibrillation 08/23/2010  . DISORDER OF BONE AND CARTILAGE  UNSPECIFIED 04/28/2010  . SENILE DEMENTIA, UNCOMPLICATED 32/95/1884  . Diabetes mellitus with kidney disease 12/11/2007  . Anemia, iron deficiency 12/11/2007  . CAROTID STENOSIS 12/11/2007  . B12 DEFICIENCY 12/09/2007  . HYPERLIPIDEMIA 09/11/2007  . Essential hypertension, benign 09/11/2007  . Coronary atherosclerosis of native coronary artery 09/11/2007  . DEGENERATIVE JOINT DISEASE 09/11/2007  . OSTEOARTHRITIS, KNEE, SEVERE 09/11/2007    LABS    Component Value Date/Time   NA 138 07/23/2013 1455   NA 139 04/01/2013 0736   NA 148* 03/06/2012 1436   K 4.1 07/23/2013 1455   K 3.5 04/01/2013 0736   K 4.8 03/06/2012 1436   CL 103 07/23/2013 1455   CL 101 04/01/2013 0736   CL 104 03/06/2012 1436   CO2 24 07/23/2013 1455   CO2 27 04/01/2013 0736   CO2 27 03/06/2012 1436   GLUCOSE 116* 07/23/2013 1455   GLUCOSE 150* 04/01/2013 0736   GLUCOSE 78 03/06/2012 1436   BUN 9 07/23/2013 1455   BUN 6 04/01/2013 0736   BUN 11 03/06/2012 1436   CREATININE 1.34 07/23/2013 1455   CREATININE 1.34 04/01/2013 0736   CREATININE 1.36* 03/06/2012 1436   CREATININE 1.27 09/01/2010  1402   CREATININE 1.14 08/11/2010 2136   CREATININE 0.97 04/28/2010 1952   CALCIUM 8.7 07/23/2013 1455   CALCIUM 8.5 04/01/2013 0736   CALCIUM 9.1 03/06/2012 1436   GFRNONAA 50* 07/23/2013 1455   GFRNONAA 50* 04/01/2013 0736   GFRNONAA 49* 03/06/2012 1436   GFRNONAA 55* 09/01/2010 1402   GFRNONAA >60 08/11/2010 2136   GFRNONAA >60 12/22/2008 0352   GFRAA 57* 07/23/2013 1455   GFRAA 57* 04/01/2013 0736   GFRAA 57* 03/06/2012 1436   GFRAA  Value: >60        The eGFR has been calculated using the MDRD equation. This calculation has not been validated in all clinical situations. eGFR's persistently <60 mL/min signify possible Chronic Kidney Disease. 09/01/2010 1402   GFRAA  Value: >60        The eGFR has been calculated using the MDRD equation. This calculation has not been validated in all clinical situations. eGFR's persistently <60 mL/min  signify possible Chronic Kidney Disease. 08/11/2010 2136   GFRAA  Value: >60        The eGFR has been calculated using the MDRD equation. This calculation has not been validated in all clinical situations. eGFR's persistently <60 mL/min signify possible Chronic Kidney Disease. 12/22/2008 0352   CMP     Component Value Date/Time   NA 138 07/23/2013 1455   K 4.1 07/23/2013 1455   CL 103 07/23/2013 1455   CO2 24 07/23/2013 1455   GLUCOSE 116* 07/23/2013 1455   BUN 9 07/23/2013 1455   CREATININE 1.34 07/23/2013 1455   CREATININE 1.27 09/01/2010 1402   CALCIUM 8.7 07/23/2013 1455   PROT 7.2 07/23/2013 1455   ALBUMIN 3.6 07/23/2013 1455   AST 16 07/23/2013 1455   ALT 9 07/23/2013 1455   ALKPHOS 73 07/23/2013 1455   BILITOT 0.7 07/23/2013 1455   GFRNONAA 50* 07/23/2013 1455   GFRNONAA 55* 09/01/2010 1402   GFRAA 57* 07/23/2013 1455   GFRAA  Value: >60        The eGFR has been calculated using the MDRD equation. This calculation has not been validated in all clinical situations. eGFR's persistently <60 mL/min signify possible Chronic Kidney Disease. 09/01/2010 1402       Component Value Date/Time   WBC 6.7 07/23/2013 1455   WBC 6.8 04/01/2013 0736   WBC 8.6 10/02/2011 0814   HGB 9.0* 07/23/2013 1455   HGB 10.4* 04/01/2013 0736   HGB 11.8* 10/02/2011 0814   HCT 27.2* 07/23/2013 1455   HCT 30.6* 04/01/2013 0736   HCT 34.7* 10/02/2011 0814   MCV 66.8* 07/23/2013 1455   MCV 68.6* 04/01/2013 0736   MCV 72.9* 10/02/2011 0814    Lipid Panel     Component Value Date/Time   CHOL 159 04/01/2013 0736   TRIG 128 04/01/2013 0736   HDL 28* 04/01/2013 0736   CHOLHDL 5.7 04/01/2013 0736   VLDL 26 04/01/2013 0736   LDLCALC 105* 04/01/2013 0736    ABG No results found for this basename: phart, pco2, pco2art, po2, po2art, hco3, tco2, acidbasedef, o2sat     Lab Results  Component Value Date   TSH 0.661 04/01/2013   BNP (last 3 results) No results found for this basename: PROBNP,  in the last 8760 hours Cardiac Panel (last 3  results) No results found for this basename: CKTOTAL, CKMB, TROPONINI, RELINDX,  in the last 72 hours  Iron/TIBC/Ferritin    Component Value Date/Time   IRON 17* 07/23/2013 1455   FERRITIN 45 07/23/2013 1455  EKG Orders placed in visit on 07/08/13  . EKG 12-LEAD     Prior Assessment and Plan Problem List as of 07/30/2013     Cardiovascular and Mediastinum   Essential hypertension, benign   Last Assessment & Plan   07/23/2013 Office Visit Written 07/24/2013  1:01 AM by Fayrene Helper, MD     Sub optimal control, however based on patient's age , will hold off on med increase at this voisit, he is encouraged to work on reducing salt in diet and weight loss Will increase cozaar at next visit if still elevated    Coronary atherosclerosis of native coronary artery   Last Assessment & Plan   07/08/2013 Office Visit Written 07/08/2013 11:10 AM by Satira Sark, MD     Noted per history, but details are not clear. He does not endorse any angina symptoms. Currently on aspirin, Norvasc, Mevacor and Cozaar.    CAROTID STENOSIS   Last Assessment & Plan   08/23/2010 Office Visit Written 08/23/2010  5:23 PM by Satira Sark, MD     Patient has had followup with Dr. Scot Dock in the past, although no recent visits seen. I recommended that they schedule a followup in [facility] for repeat carotid Dopplers soon.    Atrial fibrillation   Last Assessment & Plan   07/08/2013 Office Visit Written 07/08/2013 11:08 AM by Satira Sark, MD     Permanent, rate controlled without specific medical intervention. He has declined anticoagulation as noted previously, does continue on aspirin however. Continue observation.    Aortic stenosis   Last Assessment & Plan   07/08/2013 Office Visit Written 07/08/2013 11:09 AM by Satira Sark, MD     Functionally bicuspid aortic valve with mild stenosis by echocardiogram 2012. No change in examination.      Endocrine   Diabetes mellitus with kidney disease    Last Assessment & Plan   07/23/2013 Office Visit Written 07/24/2013  1:06 AM by Fayrene Helper, MD     Updated lab needed at/ before next visit. Patient advised to reduce carb and sweets, commit to regular physical activity, take meds as prescribed, test blood as directed, and attempt to lose weight, to improve blood sugar control.       Nervous and Auditory   SENILE DEMENTIA, UNCOMPLICATED   Last Assessment & Plan   07/23/2013 Office Visit Written 07/24/2013  1:06 AM by Fayrene Helper, MD     Appears to be deteriorating in that his behavior appears somewhat inappropriate at times. Clothing also not as clean as it has been in the past, may need toaddress this with his daughter if recurrs Need to keep on monthly B12      Musculoskeletal and McBride   07/23/2013 Office Visit Written 07/24/2013  1:04 AM by Fayrene Helper, MD     Fall safety issues discussed. Pt to continue to use cane and home safety literature provided    DISORDER OF BONE AND CARTILAGE UNSPECIFIED   Onychomycosis   Last Assessment & Plan   04/22/2013 Office Visit Written 04/27/2013 10:55 PM by Fayrene Helper, MD     ept to take oral lamisil also needs podiatry for toenail care      Other   B12 DEFICIENCY   Last Assessment & Plan   04/22/2013 Office Visit Written 04/27/2013 10:57 PM by Fayrene Helper, MD  Needs to resume monthly B12 espescialy due to dementia diagnosis    HYPERLIPIDEMIA   Last Assessment & Plan   07/23/2013 Office Visit Written 07/24/2013  1:03 AM by Fayrene Helper, MD     Uncontrolled Hyperlipidemia:Low fat diet discussed and encouraged.  Updated lab needed at/ before next visit.     Anemia, iron deficiency   Last Assessment & Plan   04/29/2013 Office Visit Written 04/29/2013  2:18 PM by Mahala Menghini, PA-C     Ongoing IDA. Hemoccult status unknown. Collect ifobt. Will likely need colonoscopy  and EGD again since last look was five years ago. Further recommendations to follow.    Right hand pain   Last Assessment & Plan   04/22/2013 Office Visit Written 04/27/2013 10:55 PM by Fayrene Helper, MD     Recent onset in past 1 month, with weakness and inability to make a fist, refer to ortho for eval. No recent h/o trauma    Elevated PSA   Last Assessment & Plan   04/22/2013 Office Visit Written 04/27/2013 10:56 PM by Fayrene Helper, MD     Urology to eval and follow    Dyspnea   Last Assessment & Plan   07/23/2013 Office Visit Written 07/24/2013  1:02 AM by Fayrene Helper, MD     2 week h/o progressive dyspnea, no symptoms or signs of heart failure. May need re imaging of heart structure , none in over 3 years , will refer back to cardiology for consideration of this based on current symptoms. CXr today        Imaging: Dg Chest 2 View  07/29/2013   CLINICAL DATA:  Shortness of breath, dyspnea, history TIA, diabetes, hypertension, hyperlipidemia, coronary artery disease, atrial fibrillation  EXAM: CHEST  2 VIEW  COMPARISON:  12/17/2008  FINDINGS: Enlargement of cardiac silhouette with pulmonary vascular congestion.  Mediastinal contours normal.  Minimal peribronchial thickening.  Hazy right upper lobe infiltrate.  Slightly increased markings at both lung bases.  Remaining lungs clear.  No pleural effusion or pneumothorax.  Questionable faint nodular density versus infiltrate or artifact in left upper lobe.  IMPRESSION: Enlargement of cardiac silhouette with pulmonary vascular congestion.  Right upper lobe infiltrate with accentuated basilar markings bilaterally.  Findings could represent asymmetric edema though pneumonia in the right upper lobe is not excluded.  Due to presence of a questionable artifact versus nodular density or minimal infiltrate at a focus in the left upper lobe, recommend followup chest radiographs in 2-4 weeks to ensure resolution.   Electronically Signed   By:  Lavonia Dana M.D.   On: 07/29/2013 13:33

## 2013-07-30 NOTE — Assessment & Plan Note (Signed)
Appears stable from cardiac standpoint. No changes.

## 2013-07-30 NOTE — Assessment & Plan Note (Signed)
X-ray is described above. He will followup with Dr. Moshe Cipro with decision to be made concerning his chest x-ray repeating or further treatment.

## 2013-07-30 NOTE — Assessment & Plan Note (Signed)
As he is a systematic concerning significant dyspnea on exertion, chest pain, syncope, we will not plan on any further testing at this time. We will followup with echocardiogram approximately one year unless he is symptomatic.

## 2013-07-30 NOTE — Patient Instructions (Signed)
Your physician wants you to follow-up in: 6 months You will receive a reminder letter in the mail two months in advance. If you don't receive a letter, please call our office to schedule the follow-up appointment.     Your physician recommends that you continue on your current medications as directed. Please refer to the Current Medication list given to you today.      Thank you for choosing [facility] !        

## 2013-07-30 NOTE — Assessment & Plan Note (Signed)
Pressure is well-controlled currently. Will not make any changes in his medication regimen at this time. Labs have been discussed as above. See him again in 6 months unless symptomatic.

## 2013-07-31 NOTE — Telephone Encounter (Signed)
Noted  

## 2013-07-31 NOTE — Telephone Encounter (Signed)
Done

## 2013-08-03 NOTE — Telephone Encounter (Signed)
pls

## 2013-08-04 ENCOUNTER — Other Ambulatory Visit: Payer: Self-pay

## 2013-08-04 DIAGNOSIS — M79641 Pain in right hand: Secondary | ICD-10-CM

## 2013-08-04 NOTE — Telephone Encounter (Signed)
Not sure if he is there re knee pain and instability, I see no referral on record, if so and if he has not had any knee surgery then pls refer to Dr Aline Brochure [facility] [place] per family preference or ortho in [place] which accepts his ins.I will sign Pls let me know if further prob also let me know if referral is about something other than knee eval

## 2013-08-04 NOTE — Telephone Encounter (Signed)
Patient referred to Dr. Eulas Post per daughter request.

## 2013-08-05 ENCOUNTER — Other Ambulatory Visit: Payer: Self-pay

## 2013-08-05 ENCOUNTER — Encounter: Payer: Self-pay | Admitting: Family

## 2013-08-05 DIAGNOSIS — J189 Pneumonia, unspecified organism: Secondary | ICD-10-CM

## 2013-08-05 MED ORDER — PENICILLIN V POTASSIUM 500 MG PO TABS: 500.0000 mg | ORAL_TABLET | Freq: Three times a day (TID) | ORAL | Status: DC

## 2013-08-05 MED ORDER — BENZONATATE 100 MG PO CAPS: 100.0000 mg | ORAL_CAPSULE | Freq: Two times a day (BID) | ORAL | Status: DC | PRN

## 2013-08-06 ENCOUNTER — Ambulatory Visit (INDEPENDENT_AMBULATORY_CARE_PROVIDER_SITE_OTHER): Payer: Medicare Other | Admitting: Family

## 2013-08-06 ENCOUNTER — Encounter: Payer: Self-pay | Admitting: Family

## 2013-08-06 ENCOUNTER — Ambulatory Visit (HOSPITAL_COMMUNITY)
Admission: RE | Admit: 2013-08-06 | Discharge: 2013-08-06 | Disposition: A | Discharge status: Home / Self Care | Payer: Medicare Other | Source: Ambulatory Visit | Attending: Family | Admitting: Family

## 2013-08-06 VITALS — BP 143/80 | HR 70 | Resp 16 | Ht 68.5 in | Wt 222.0 lb

## 2013-08-06 DIAGNOSIS — I6523 Occlusion and stenosis of bilateral carotid arteries: Secondary | ICD-10-CM

## 2013-08-06 DIAGNOSIS — I6529 Occlusion and stenosis of unspecified carotid artery: Secondary | ICD-10-CM

## 2013-08-06 NOTE — Progress Notes (Signed)
Established Carotid Patient   History of Present Illness  Tyrone Hall is a 80 y.o. male patient whom Dr. Scot Dock has been following since 2009 for mild carotid artery stenosis. Had a stroke 7-8 years ago as manifested by a funny feeling in his left arm only, he denies any left leg weakness, denies monocular blindness, denies aphasia, no further stroke or TIA activity. He denies any further weakness or other neurological deficits.  Daughter states he drinks sugared Sprite and little water. Patient has not had previous carotid artery intervention. He denies claudication symptoms with walking, denies non healing wounds.  Daughter reports New Medical or Surgical History: newly diagnosed anemia and taking PCN for a lung infection.  Pt Diabetic: Yes, 7.3 A1C 2 weeks ago, not in control Pt smoker: former smoker, quit 35 years ago  Pt meds include: Statin : Yes ASA: Yes Other anticoagulants/antiplatelets: no, coumadin was offered, he has atrial fib, but he declined, per dtr.   Past Medical History  Diagnosis Date  . Coronary atherosclerosis of native coronary artery     Details not clear  . OA (osteoarthritis) of knee   . Type 2 diabetes mellitus   . Mixed hyperlipidemia   . Essential hypertension, benign   . TIA (transient ischemic attack)   . B12 deficiency   . Carotid artery disease     60-79% RICA and 85-46% LICA -  2/70  . Acute biliary pancreatitis   . Dementia   . Atrial fibrillation   . Anemia   . Stroke 2008    Social History History  Substance Use Topics  . Smoking status: Former Smoker    Types: Cigarettes    Quit date: 05/18/1983  . Smokeless tobacco: Never Used  . Alcohol Use: No    Family History Family History  Problem Relation Age of Onset  . Diabetes type II    . Hypertension    . Colon cancer Neg Hx     Surgical History Past Surgical History  Procedure Laterality Date  . Cholecystectomy    . Hemorrhoid surgery    . Common bile duct stone  removal      s/p ercp with biliary and pancreatic stent placement and subsequent removal 2005  . Colonoscopy    01/20/2008    JJK:[id number], normal rectum, left-sided diverticula  . Esophagogastroduodenoscopy    01/20/2008    JIR:CVELFY esophagus, small hiatal hernia, healing ulcer scar antrum . bx mild inflammation c/w with healed ulceration. no h.pylori, small bowel bx negative.    No Known Allergies  Current Outpatient Prescriptions  Medication Sig Dispense Refill  . amLODipine (NORVASC) 5 MG tablet TAKE ONE TABLET DAILY.  30 tablet  2  . aspirin (ASPIRIN LOW DOSE) 81 MG EC tablet Take 81 mg by mouth daily.       . benzonatate (TESSALON) 100 MG capsule Take 1 capsule (100 mg total) by mouth 2 (two) times daily as needed for cough.  14 capsule  0  . donepezil (ARICEPT) 10 MG tablet TAKE (1) TABLET BY MOUTH AT BEDTIME.  30 tablet  2  . ferrous sulfate 325 (65 FE) MG tablet Take 325 mg by mouth daily with breakfast.      . losartan (COZAAR) 25 MG tablet Take 1 tablet (25 mg total) by mouth daily.  30 tablet  5  . lovastatin (MEVACOR) 40 MG tablet TAKE (1) TABLET BY MOUTH AT BEDTIME.  30 tablet  2  . metFORMIN (GLUCOPHAGE) 1000 MG tablet TAKE ONE  TABLET BY MOUTH ONCE DAILY WITH BREAKFAST.  30 tablet  2  . penicillin v potassium (VEETID) 500 MG tablet Take 1 tablet (500 mg total) by mouth 3 (three) times daily.  21 tablet  0  . terbinafine (LAMISIL) 250 MG tablet Take 1 tablet (250 mg total) by mouth daily.  30 tablet  3   No current facility-administered medications for this visit.    Review of Systems : See HPI for pertinent positives and negatives.  Physical Examination  Filed Vitals:   08/06/13 1152  BP: 143/80  Pulse: 70  Resp: 16   Filed Weights   08/06/13 1152  Weight: 222 lb (100.699 kg)   Body mass index is 33.26 kg/(m^2).   General: WDWN obese male in NAD GAIT: normal Eyes: PERRLA Pulmonary:  Non-labored, CTAB, Negative  Rales, Negative rhonchi, & Negative  wheezing.  Cardiac: irregular Rhythm  VASCULAR EXAM Carotid Bruits Left Right   Negative Negative     Radial pulses are 2+ palpable and equal.                                                                                                                    Gastrointestinal: soft, nontender, BS WNL, no r/g,  negative masses.  Musculoskeletal: Negative muscle atrophy/wasting. M/S 5/5 throughout, Extremities without ischemic changes.  Neurologic: A&O X 3; Appropriate Affect ; SENSATION ;normal;  Speech is normal CN 2-12 intact except has chronic chewing activity with nothing in mouth, Pain and light touch intact in extremities except, Motor exam as listed above.   Non-Invasive Vascular Imaging CAROTID DUPLEX 08/06/2013   CEREBROVASCULAR DUPLEX EVALUATION    INDICATION: Carotid artery disease     PREVIOUS INTERVENTION(S):     DUPLEX EXAM:     RIGHT  LEFT  Peak Systolic Velocities (cm/s) End Diastolic Velocities (cm/s) Plaque LOCATION Peak Systolic Velocities (cm/s) End Diastolic Velocities (cm/s) Plaque  35 12  CCA PROXIMAL 71 8   37 7  CCA MID 52 6   39 9 CP CCA DISTAL 39 6 CP  37 7 CP ECA 46  CP  33 11 CP ICA PROXIMAL 58 15 CP  143 42  ICA MID 48 16   112 35  ICA DISTAL 74 28     3.86 ICA / CCA Ratio (PSV) 1.42  Antegrade  Vertebral Flow Antegrade    Brachial Systolic Pressure (mmHg)    Brachial Artery Waveforms     Plaque Morphology:  HM = Homogeneous, HT = Heterogeneous, CP = Calcific Plaque, SP = Smooth Plaque, IP = Irregular Plaque     ADDITIONAL FINDINGS:     IMPRESSION: Right internal carotid artery velocities are suggestive of a 40-59% stenosis; however, velocities may be underestimated due to calcific plaque with acoustic shadowing. Left internal carotid artery velocities suggest a <40% stenosis.     Compared to the previous exam:  No significant change in comparison to the last exam on 08/31/2010.    Assessment: Tyrone Hall is a 80 y.o.  male patient  whom Dr. Scot Dock has been following since 2009 for mild carotid artery stenosis. Had a stroke 7-8 years ago as manifested by a funny feeling in his left arm only, he denies any left leg weakness, denies monocular blindness, denies aphasia, no further stroke or TIA activity. Mild/moderate right ICA stenosis and minimal left ICA stenosis. No significant change in comparison to the last exam on 08/31/2010.  Plan: Follow-up in 1 year with Carotid Duplex scan.   I discussed in depth with the patient the nature of atherosclerosis, and emphasized the importance of maximal medical management including strict control of blood pressure, blood glucose, and lipid levels, obtaining regular exercise, and continued cessation of smoking.  The patient is aware that without maximal medical management the underlying atherosclerotic disease process will progress, limiting the benefit of any interventions. The patient was given information about stroke prevention and what symptoms should prompt the patient to seek immediate medical care. Thank you for allowing [place] to participate in this patient's care.  Clemon Chambers, RN, MSN, FNP-C [facility] Office: [phone]  Clinic Physician: Scot Dock  08/06/2013 12:03 PM

## 2013-08-06 NOTE — Patient Instructions (Signed)

## 2013-08-19 ENCOUNTER — Ambulatory Visit (INDEPENDENT_AMBULATORY_CARE_PROVIDER_SITE_OTHER): Payer: Medicare Other | Admitting: Internal Medicine

## 2013-08-19 DIAGNOSIS — D509 Iron deficiency anemia, unspecified: Secondary | ICD-10-CM

## 2013-08-19 LAB — IFOBT (OCCULT BLOOD): IMMUNOLOGICAL FECAL OCCULT BLOOD TEST: POSITIVE

## 2013-08-19 NOTE — Progress Notes (Signed)
Pt return his IFOBT test and it was POSITIVE

## 2013-08-21 NOTE — Progress Notes (Signed)
Quick Note:  Please discuss with patient and daughter.  Recent labs by PCP show worsened IDA. He has occult blood in his stool. He needs OV to address. Likely offer TCS +/- EGD. ______

## 2013-08-28 ENCOUNTER — Ambulatory Visit (INDEPENDENT_AMBULATORY_CARE_PROVIDER_SITE_OTHER): Payer: Medicare Other

## 2013-08-28 ENCOUNTER — Encounter (INDEPENDENT_AMBULATORY_CARE_PROVIDER_SITE_OTHER): Payer: Self-pay

## 2013-08-28 DIAGNOSIS — E538 Deficiency of other specified B group vitamins: Secondary | ICD-10-CM | POA: Diagnosis not present

## 2013-08-29 DIAGNOSIS — E538 Deficiency of other specified B group vitamins: Secondary | ICD-10-CM | POA: Diagnosis not present

## 2013-08-29 MED ORDER — CYANOCOBALAMIN 1000 MCG/ML IJ SOLN
1000.0000 ug | Freq: Once | INTRAMUSCULAR | Status: AC
Start: 2013-08-29 — End: 2013-08-29
  Administered 2013-08-29: 1000 ug via INTRAMUSCULAR

## 2013-08-29 NOTE — Progress Notes (Signed)
Patient in for monthly b12 injection.  Injection given IM with no signs or symptoms of an adverse reaction.  No voiced complaints by patient.  He is aware that next injection is due in 30 days.

## 2013-09-01 ENCOUNTER — Encounter: Payer: Self-pay | Admitting: Internal Medicine

## 2013-09-02 ENCOUNTER — Other Ambulatory Visit: Payer: Self-pay | Admitting: Family Medicine

## 2013-09-03 DIAGNOSIS — M6530 Trigger finger, unspecified finger: Secondary | ICD-10-CM | POA: Diagnosis not present

## 2013-09-17 ENCOUNTER — Ambulatory Visit (HOSPITAL_COMMUNITY)
Admission: RE | Admit: 2013-09-17 | Discharge: 2013-09-17 | Disposition: A | Discharge status: Home / Self Care | Payer: Medicare Other | Source: Ambulatory Visit | Attending: Family Medicine | Admitting: Family Medicine

## 2013-09-17 DIAGNOSIS — I517 Cardiomegaly: Secondary | ICD-10-CM | POA: Diagnosis not present

## 2013-09-17 DIAGNOSIS — R918 Other nonspecific abnormal finding of lung field: Secondary | ICD-10-CM | POA: Diagnosis not present

## 2013-09-17 DIAGNOSIS — J189 Pneumonia, unspecified organism: Secondary | ICD-10-CM

## 2013-10-01 ENCOUNTER — Encounter (INDEPENDENT_AMBULATORY_CARE_PROVIDER_SITE_OTHER): Payer: Self-pay

## 2013-10-01 ENCOUNTER — Ambulatory Visit (INDEPENDENT_AMBULATORY_CARE_PROVIDER_SITE_OTHER): Payer: Medicare Other | Admitting: Gastroenterology

## 2013-10-01 ENCOUNTER — Other Ambulatory Visit: Payer: Self-pay | Admitting: Internal Medicine

## 2013-10-01 ENCOUNTER — Encounter: Payer: Self-pay | Admitting: Gastroenterology

## 2013-10-01 VITALS — BP 160/98 | HR 68 | Temp 98.3°F | Resp 20 | Ht 68.0 in | Wt 220.0 lb

## 2013-10-01 DIAGNOSIS — I2510 Atherosclerotic heart disease of native coronary artery without angina pectoris: Secondary | ICD-10-CM

## 2013-10-01 DIAGNOSIS — D509 Iron deficiency anemia, unspecified: Secondary | ICD-10-CM

## 2013-10-01 DIAGNOSIS — R195 Other fecal abnormalities: Secondary | ICD-10-CM | POA: Diagnosis not present

## 2013-10-01 NOTE — Assessment & Plan Note (Signed)
Progressive iron deficiency anemia, Hemoccult-positive stool. Discussed at length with patient and daughter today. Last EGD and colonoscopy 5 years ago, he did not have any polyps. Would recommend colonoscopy with possible upper endoscopy to initiate workup. Patient is opposed to colonoscopy and daughter does not feel like she willl be able to get him to agree to this or take the bowel prep. It took her 3 months to convince him to take a stool specimen. She would like pursue upper endoscopy and given his history of ulcers in the past this would not be an unreasonable approach. They realize that if upper endoscopy is unremarkable he would then be advised to pursue further evaluation. Cannot rule out harboring malignancy in lower GI tract without colonoscopy. They are agreeable with this approach. I have discussed the risks, alternatives, benefits with regards to but not limited to the risk of reaction to medication, bleeding, infection, perforation and the patient is agreeable to proceed. Written consent to be obtained.  Continue iron therapy. Recommend periodic check of H/H.

## 2013-10-01 NOTE — Patient Instructions (Signed)
1. Upper endoscopy as scheduled. See separate instructions.  

## 2013-10-01 NOTE — Progress Notes (Signed)
Primary Care Physician:  Tula Nakayama, MD  Primary Gastroenterologist:  Garfield Cornea, MD   Chief Complaint  Patient presents with  . Anemia    HPI:  Tyrone Hall is a 80 y.o. male here to discuss recent iFOBT positive stool, iron deficiency anemia. Last seen in January 2015 for evaluation of IDA. He did not complete a stool test for 3-4 months. Last EGD and colonoscopy in 2009 for iron deficiency anemia revealed evidence of healing ulcer. At that time he was on Celebrex. He was offered a small bowel capsule endoscopy but he failed to show for the study.  When I saw him in January he had ongoing iron deficiency anemia however his hemoglobin was essentially unchanged for the past 4 years. Had also been off B12 injections for greater than a year. He had no GI complaints. In April he had updated labs that showed worsening iron and hemoglobin. Iron had dropped from 46-17, hemoglobin from 10.4-9.  Presents with his daughter today. Patient denies any constipation, diarrhea, melena, rectal bleeding, abdominal pain, heartburn, vomiting, dysphagia. He's had no significant weight loss.  Current Outpatient Prescriptions  Medication Sig Dispense Refill  . amLODipine (NORVASC) 5 MG tablet TAKE ONE TABLET DAILY.  30 tablet  3  . aspirin (ASPIRIN LOW DOSE) 81 MG EC tablet Take 81 mg by mouth daily.       . benzonatate (TESSALON) 100 MG capsule Take 1 capsule (100 mg total) by mouth 2 (two) times daily as needed for cough.  14 capsule  0  . donepezil (ARICEPT) 10 MG tablet TAKE (1) TABLET BY MOUTH AT BEDTIME.  30 tablet  3  . ferrous sulfate 325 (65 FE) MG tablet Take 325 mg by mouth daily with breakfast.      . losartan (COZAAR) 25 MG tablet Take 1 tablet (25 mg total) by mouth daily.  30 tablet  5  . lovastatin (MEVACOR) 40 MG tablet TAKE (1) TABLET BY MOUTH AT BEDTIME.  30 tablet  3  . metFORMIN (GLUCOPHAGE) 1000 MG tablet TAKE ONE TABLET BY MOUTH ONCE DAILY WITH BREAKFAST.  30 tablet  3   No  current facility-administered medications for this visit.    Allergies as of 10/01/2013  . (No Known Allergies)    Past Medical History  Diagnosis Date  . Coronary atherosclerosis of native coronary artery     Details not clear  . OA (osteoarthritis) of knee   . Type 2 diabetes mellitus   . Mixed hyperlipidemia   . Essential hypertension, benign   . TIA (transient ischemic attack)   . B12 deficiency   . Carotid artery disease     60-79% RICA and 51-76% LICA -  1/60  . Acute biliary pancreatitis   . Dementia   . Atrial fibrillation   . Anemia   . Stroke 2008    Past Surgical History  Procedure Laterality Date  . Cholecystectomy    . Hemorrhoid surgery    . Common bile duct stone removal      s/p ercp with biliary and pancreatic stent placement and subsequent removal 2005  . Colonoscopy    01/20/2008    VPX:[id number], normal rectum, left-sided diverticula  . Esophagogastroduodenoscopy    01/20/2008    EVO:JJKKXF esophagus, small hiatal hernia, healing ulcer scar antrum . bx mild inflammation c/w with healed ulceration. no h.pylori, small bowel bx negative.    Family History  Problem Relation Age of Onset  . Diabetes type II    .  Hypertension    . Colon cancer Neg Hx     History   Social History  . Marital Status: Divorced    Spouse Name: N/A    Number of Children: 2  . Years of Education: N/A   Occupational History  . Retired   .     Social History Main Topics  . Smoking status: Former Smoker    Types: Cigarettes    Quit date: 05/18/1983  . Smokeless tobacco: Never Used  . Alcohol Use: No  . Drug Use: No  . Sexual Activity: Not on file   Other Topics Concern  . Not on file   Social History Narrative  . No narrative on file      ROS:  General: Negative for anorexia, weight loss, fever, chills, fatigue, weakness. Eyes: Negative for vision changes.  ENT: Negative for hoarseness, difficulty swallowing , nasal congestion. CV: Negative for  chest pain, angina, palpitations, dyspnea on exertion, peripheral edema.  Respiratory: Negative for dyspnea at rest, dyspnea on exertion, cough, sputum, wheezing.  GI: See history of present illness. GU:  Negative for dysuria, hematuria, urinary incontinence, urinary frequency, nocturnal urination.  MS: Negative for joint pain, low back pain.  Derm: Negative for rash or itching.  Neuro: Negative for weakness, abnormal sensation, seizure, frequent headaches, memory loss, confusion.  Psych: Negative for anxiety, depression, suicidal ideation, hallucinations.  Endo: Negative for unusual weight change.  Heme: Negative for bruising or bleeding. Allergy: Negative for rash or hives.    Physical Examination:  BP 160/98  Pulse 68  Temp(Src) 98.3 F (36.8 C) (Oral)  Resp 20  Ht 5\' 8"  (1.727 m)  Wt 220 lb (99.791 kg)  BMI 33.46 kg/m2   General: Well-nourished, well-developed in no acute distress.  Head: Normocephalic, atraumatic.   Eyes: Conjunctiva pink, no icterus. Mouth: Oropharyngeal mucosa moist and pink , no lesions erythema or exudate. Neck: Supple without thyromegaly, masses, or lymphadenopathy.  Lungs: Clear to auscultation bilaterally.  Heart: Regular rate and rhythm, no murmurs rubs or gallops.  Abdomen: Bowel sounds are normal, nontender, nondistended, no hepatosplenomegaly or masses, no abdominal bruits or    hernia , no rebound or guarding.   Rectal: Not performed Extremities: No lower extremity edema. No clubbing or deformities.  Neuro: Alert and oriented x 4 , grossly normal neurologically.  Skin: Warm and dry, no rash or jaundice.   Psych: Alert and cooperative, normal mood and affect.  Labs: Lab Results  Component Value Date   WBC 6.7 07/23/2013   HGB 9.0* 07/23/2013   HCT 27.2* 07/23/2013   MCV 66.8* 07/23/2013   PLT 227 07/23/2013   Lab Results  Component Value Date   CREATININE 1.34 07/23/2013   BUN 9 07/23/2013   NA 138 07/23/2013   K 4.1 07/23/2013   CL 103 07/23/2013    CO2 24 07/23/2013   Lab Results  Component Value Date   ALT 9 07/23/2013   AST 16 07/23/2013   ALKPHOS 73 07/23/2013   BILITOT 0.7 07/23/2013   Lab Results  Component Value Date   HGBA1C 7.3* 07/23/2013   Lab Results  Component Value Date   IRON 17* 07/23/2013   FERRITIN 45 07/23/2013   Lab Results  Component Value Date   VITAMINB12 >2000* 07/23/2013     Imaging Studies: Dg Chest 2 View  09/17/2013   CLINICAL DATA:  History of pneumonia.  EXAM: CHEST  2 VIEW  COMPARISON:  PA and lateral chest 07/29/2013 and 12/17/2008.  FINDINGS:  Hazy opacity in the right upper lobe seen on the prior study has cleared. Questioned nodular opacity in the left upper lobe seen on the prior study is not appreciated on this examination. Cardiomegaly is noted. No pleural effusion or pneumothorax. No pneumothorax.  IMPRESSION: Resolved right upper lobe airspace disease.  Cardiomegaly without edema.   Electronically Signed   By: Inge Rise M.D.   On: 09/17/2013 16:21

## 2013-10-01 NOTE — Progress Notes (Signed)
Cc'd to pcp 

## 2013-10-10 DIAGNOSIS — R195 Other fecal abnormalities: Secondary | ICD-10-CM | POA: Diagnosis not present

## 2013-10-10 DIAGNOSIS — D509 Iron deficiency anemia, unspecified: Secondary | ICD-10-CM | POA: Diagnosis not present

## 2013-10-10 LAB — CBC WITH DIFFERENTIAL/PLATELET
BASOS ABS: 0 10*3/uL (ref 0.0–0.1)
Basophils Relative: 0 % (ref 0–1)
EOS PCT: 1 % (ref 0–5)
Eosinophils Absolute: 0.1 10*3/uL (ref 0.0–0.7)
HCT: 38 % — ABNORMAL LOW (ref 39.0–52.0)
Hemoglobin: 13.2 g/dL (ref 13.0–17.0)
LYMPHS PCT: 38 % (ref 12–46)
Lymphs Abs: 3 10*3/uL (ref 0.7–4.0)
MCH: 24.8 pg — ABNORMAL LOW (ref 26.0–34.0)
MCHC: 34.7 g/dL (ref 30.0–36.0)
MCV: 71.4 fL — AB (ref 78.0–100.0)
Monocytes Absolute: 0.5 10*3/uL (ref 0.1–1.0)
Monocytes Relative: 7 % (ref 3–12)
NEUTROS PCT: 54 % (ref 43–77)
Neutro Abs: 4.2 10*3/uL (ref 1.7–7.7)
PLATELETS: 138 10*3/uL — AB (ref 150–400)
RBC: 5.32 MIL/uL (ref 4.22–5.81)
RDW: 19.9 % — AB (ref 11.5–15.5)
WBC: 7.8 10*3/uL (ref 4.0–10.5)

## 2013-10-13 NOTE — Progress Notes (Signed)
Quick Note:  Hgb improved from 2 months ago (9 to 13.2).  Continue with EGD as planned. ______

## 2013-10-20 NOTE — Progress Notes (Signed)
Quick Note:  Agree. ______

## 2013-10-29 ENCOUNTER — Encounter (HOSPITAL_COMMUNITY)
Admission: RE | Disposition: A | Discharge status: Home / Self Care | Payer: Self-pay | Source: Ambulatory Visit | Attending: Internal Medicine

## 2013-10-29 ENCOUNTER — Encounter (HOSPITAL_COMMUNITY): Payer: Self-pay | Admitting: Pharmacy Technician

## 2013-10-29 ENCOUNTER — Encounter (HOSPITAL_COMMUNITY): Payer: Self-pay

## 2013-10-29 ENCOUNTER — Ambulatory Visit (HOSPITAL_COMMUNITY)
Admission: RE | Admit: 2013-10-29 | Discharge: 2013-10-29 | Disposition: A | Discharge status: Home / Self Care | Payer: Medicare Other | Source: Ambulatory Visit | Attending: Internal Medicine | Admitting: Internal Medicine

## 2013-10-29 DIAGNOSIS — R195 Other fecal abnormalities: Secondary | ICD-10-CM | POA: Insufficient documentation

## 2013-10-29 DIAGNOSIS — Z87891 Personal history of nicotine dependence: Secondary | ICD-10-CM | POA: Insufficient documentation

## 2013-10-29 DIAGNOSIS — E538 Deficiency of other specified B group vitamins: Secondary | ICD-10-CM | POA: Insufficient documentation

## 2013-10-29 DIAGNOSIS — Z79899 Other long term (current) drug therapy: Secondary | ICD-10-CM | POA: Diagnosis not present

## 2013-10-29 DIAGNOSIS — F0390 Unspecified dementia without behavioral disturbance: Secondary | ICD-10-CM | POA: Insufficient documentation

## 2013-10-29 DIAGNOSIS — K2960 Other gastritis without bleeding: Secondary | ICD-10-CM | POA: Diagnosis not present

## 2013-10-29 DIAGNOSIS — I10 Essential (primary) hypertension: Secondary | ICD-10-CM | POA: Insufficient documentation

## 2013-10-29 DIAGNOSIS — Z7982 Long term (current) use of aspirin: Secondary | ICD-10-CM | POA: Diagnosis not present

## 2013-10-29 DIAGNOSIS — E119 Type 2 diabetes mellitus without complications: Secondary | ICD-10-CM | POA: Insufficient documentation

## 2013-10-29 DIAGNOSIS — D509 Iron deficiency anemia, unspecified: Secondary | ICD-10-CM | POA: Diagnosis not present

## 2013-10-29 DIAGNOSIS — K259 Gastric ulcer, unspecified as acute or chronic, without hemorrhage or perforation: Secondary | ICD-10-CM | POA: Diagnosis not present

## 2013-10-29 DIAGNOSIS — E782 Mixed hyperlipidemia: Secondary | ICD-10-CM | POA: Diagnosis not present

## 2013-10-29 HISTORY — PX: ESOPHAGOGASTRODUODENOSCOPY: SHX5428

## 2013-10-29 LAB — GLUCOSE, CAPILLARY: Glucose-Capillary: 112 mg/dL — ABNORMAL HIGH (ref 70–99)

## 2013-10-29 SURGERY — EGD (ESOPHAGOGASTRODUODENOSCOPY)
Anesthesia: Moderate Sedation

## 2013-10-29 MED ORDER — MIDAZOLAM HCL 5 MG/5ML IJ SOLN
INTRAMUSCULAR | Status: DC | PRN
  Administered 2013-10-29: 2 mg via INTRAVENOUS

## 2013-10-29 MED ORDER — STERILE WATER FOR IRRIGATION IR SOLN
Status: DC | PRN
  Administered 2013-10-29: 13:00:00

## 2013-10-29 MED ORDER — ONDANSETRON HCL 4 MG/2ML IJ SOLN
INTRAMUSCULAR | Status: DC
Start: 2013-10-29 — End: 2013-10-29
  Filled 2013-10-29: qty 2

## 2013-10-29 MED ORDER — LIDOCAINE VISCOUS 2 % MT SOLN
OROMUCOSAL | Status: DC | PRN
  Administered 2013-10-29 (×2): 2 mL via OROMUCOSAL

## 2013-10-29 MED ORDER — SODIUM CHLORIDE 0.9 % IV SOLN
INTRAVENOUS | Status: DC
  Administered 2013-10-29: 12:00:00 via INTRAVENOUS

## 2013-10-29 MED ORDER — MEPERIDINE HCL 100 MG/ML IJ SOLN
INTRAMUSCULAR | Status: AC
  Filled 2013-10-29: qty 2

## 2013-10-29 MED ORDER — MEPERIDINE HCL 100 MG/ML IJ SOLN
INTRAMUSCULAR | Status: DC | PRN
  Administered 2013-10-29: 25 mg via INTRAVENOUS

## 2013-10-29 MED ORDER — MIDAZOLAM HCL 5 MG/5ML IJ SOLN
INTRAMUSCULAR | Status: AC
  Filled 2013-10-29: qty 10

## 2013-10-29 MED ORDER — LIDOCAINE VISCOUS 2 % MT SOLN
OROMUCOSAL | Status: AC
  Filled 2013-10-29: qty 15

## 2013-10-29 NOTE — Interval H&P Note (Signed)
History and Physical Interval Note:  10/29/2013 12:40 PM  Tyrone Hall  has presented today for surgery, with the diagnosis of HEME POSITIVE STOOL  AND IDA  The various methods of treatment have been discussed with the patient and family. After consideration of risks, benefits and other options for treatment, the patient has consented to  Procedure(s) with comments: ESOPHAGOGASTRODUODENOSCOPY (EGD) (N/A) - 12:45 as a surgical intervention .  The patient's history has been reviewed, patient examined, no change in status, stable for surgery.  I have reviewed the patient's chart and labs.  Questions were answered to the patient's satisfaction.    No change .  EGD per plan. The risks, benefits, limitations, alternatives and imponderables have been reviewed with the patient. Potential for esophageal dilation, biopsy, etc. have also been reviewed.  Questions have been answered. All parties agreeable.  Manus Rudd

## 2013-10-29 NOTE — H&P (View-Only) (Signed)
Primary Care Physician:  Tula Nakayama, MD  Primary Gastroenterologist:  Garfield Cornea, MD   Chief Complaint  Patient presents with  . Anemia    HPI:  Tyrone Hall is a 80 y.o. male here to discuss recent iFOBT positive stool, iron deficiency anemia. Last seen in January 2015 for evaluation of IDA. He did not complete a stool test for 3-4 months. Last EGD and colonoscopy in 2009 for iron deficiency anemia revealed evidence of healing ulcer. At that time he was on Celebrex. He was offered a small bowel capsule endoscopy but he failed to show for the study.  When I saw him in January he had ongoing iron deficiency anemia however his hemoglobin was essentially unchanged for the past 4 years. Had also been off B12 injections for greater than a year. He had no GI complaints. In April he had updated labs that showed worsening iron and hemoglobin. Iron had dropped from 46-17, hemoglobin from 10.4-9.  Presents with his daughter today. Patient denies any constipation, diarrhea, melena, rectal bleeding, abdominal pain, heartburn, vomiting, dysphagia. He's had no significant weight loss.  Current Outpatient Prescriptions  Medication Sig Dispense Refill  . amLODipine (NORVASC) 5 MG tablet TAKE ONE TABLET DAILY.  30 tablet  3  . aspirin (ASPIRIN LOW DOSE) 81 MG EC tablet Take 81 mg by mouth daily.       . benzonatate (TESSALON) 100 MG capsule Take 1 capsule (100 mg total) by mouth 2 (two) times daily as needed for cough.  14 capsule  0  . donepezil (ARICEPT) 10 MG tablet TAKE (1) TABLET BY MOUTH AT BEDTIME.  30 tablet  3  . ferrous sulfate 325 (65 FE) MG tablet Take 325 mg by mouth daily with breakfast.      . losartan (COZAAR) 25 MG tablet Take 1 tablet (25 mg total) by mouth daily.  30 tablet  5  . lovastatin (MEVACOR) 40 MG tablet TAKE (1) TABLET BY MOUTH AT BEDTIME.  30 tablet  3  . metFORMIN (GLUCOPHAGE) 1000 MG tablet TAKE ONE TABLET BY MOUTH ONCE DAILY WITH BREAKFAST.  30 tablet  3   No  current facility-administered medications for this visit.    Allergies as of 10/01/2013  . (No Known Allergies)    Past Medical History  Diagnosis Date  . Coronary atherosclerosis of native coronary artery     Details not clear  . OA (osteoarthritis) of knee   . Type 2 diabetes mellitus   . Mixed hyperlipidemia   . Essential hypertension, benign   . TIA (transient ischemic attack)   . B12 deficiency   . Carotid artery disease     60-79% RICA and 40-98% LICA -  1/19  . Acute biliary pancreatitis   . Dementia   . Atrial fibrillation   . Anemia   . Stroke 2008    Past Surgical History  Procedure Laterality Date  . Cholecystectomy    . Hemorrhoid surgery    . Common bile duct stone removal      s/p ercp with biliary and pancreatic stent placement and subsequent removal 2005  . Colonoscopy    01/20/2008    JYN:[id number], normal rectum, left-sided diverticula  . Esophagogastroduodenoscopy    01/20/2008    VHQ:IONGEX esophagus, small hiatal hernia, healing ulcer scar antrum . bx mild inflammation c/w with healed ulceration. no h.pylori, small bowel bx negative.    Family History  Problem Relation Age of Onset  . Diabetes type II    .  Hypertension    . Colon cancer Neg Hx     History   Social History  . Marital Status: Divorced    Spouse Name: N/A    Number of Children: 2  . Years of Education: N/A   Occupational History  . Retired   .     Social History Main Topics  . Smoking status: Former Smoker    Types: Cigarettes    Quit date: 05/18/1983  . Smokeless tobacco: Never Used  . Alcohol Use: No  . Drug Use: No  . Sexual Activity: Not on file   Other Topics Concern  . Not on file   Social History Narrative  . No narrative on file      ROS:  General: Negative for anorexia, weight loss, fever, chills, fatigue, weakness. Eyes: Negative for vision changes.  ENT: Negative for hoarseness, difficulty swallowing , nasal congestion. CV: Negative for  chest pain, angina, palpitations, dyspnea on exertion, peripheral edema.  Respiratory: Negative for dyspnea at rest, dyspnea on exertion, cough, sputum, wheezing.  GI: See history of present illness. GU:  Negative for dysuria, hematuria, urinary incontinence, urinary frequency, nocturnal urination.  MS: Negative for joint pain, low back pain.  Derm: Negative for rash or itching.  Neuro: Negative for weakness, abnormal sensation, seizure, frequent headaches, memory loss, confusion.  Psych: Negative for anxiety, depression, suicidal ideation, hallucinations.  Endo: Negative for unusual weight change.  Heme: Negative for bruising or bleeding. Allergy: Negative for rash or hives.    Physical Examination:  BP 160/98  Pulse 68  Temp(Src) 98.3 F (36.8 C) (Oral)  Resp 20  Ht 5\' 8"  (1.727 m)  Wt 220 lb (99.791 kg)  BMI 33.46 kg/m2   General: Well-nourished, well-developed in no acute distress.  Head: Normocephalic, atraumatic.   Eyes: Conjunctiva pink, no icterus. Mouth: Oropharyngeal mucosa moist and pink , no lesions erythema or exudate. Neck: Supple without thyromegaly, masses, or lymphadenopathy.  Lungs: Clear to auscultation bilaterally.  Heart: Regular rate and rhythm, no murmurs rubs or gallops.  Abdomen: Bowel sounds are normal, nontender, nondistended, no hepatosplenomegaly or masses, no abdominal bruits or    hernia , no rebound or guarding.   Rectal: Not performed Extremities: No lower extremity edema. No clubbing or deformities.  Neuro: Alert and oriented x 4 , grossly normal neurologically.  Skin: Warm and dry, no rash or jaundice.   Psych: Alert and cooperative, normal mood and affect.  Labs: Lab Results  Component Value Date   WBC 6.7 07/23/2013   HGB 9.0* 07/23/2013   HCT 27.2* 07/23/2013   MCV 66.8* 07/23/2013   PLT 227 07/23/2013   Lab Results  Component Value Date   CREATININE 1.34 07/23/2013   BUN 9 07/23/2013   NA 138 07/23/2013   K 4.1 07/23/2013   CL 103 07/23/2013    CO2 24 07/23/2013   Lab Results  Component Value Date   ALT 9 07/23/2013   AST 16 07/23/2013   ALKPHOS 73 07/23/2013   BILITOT 0.7 07/23/2013   Lab Results  Component Value Date   HGBA1C 7.3* 07/23/2013   Lab Results  Component Value Date   IRON 17* 07/23/2013   FERRITIN 45 07/23/2013   Lab Results  Component Value Date   VITAMINB12 >2000* 07/23/2013     Imaging Studies: Dg Chest 2 View  09/17/2013   CLINICAL DATA:  History of pneumonia.  EXAM: CHEST  2 VIEW  COMPARISON:  PA and lateral chest 07/29/2013 and 12/17/2008.  FINDINGS:  Hazy opacity in the right upper lobe seen on the prior study has cleared. Questioned nodular opacity in the left upper lobe seen on the prior study is not appreciated on this examination. Cardiomegaly is noted. No pleural effusion or pneumothorax. No pneumothorax.  IMPRESSION: Resolved right upper lobe airspace disease.  Cardiomegaly without edema.   Electronically Signed   By: Inge Rise M.D.   On: 09/17/2013 16:21

## 2013-10-29 NOTE — Discharge Instructions (Signed)

## 2013-10-29 NOTE — Op Note (Signed)
[facility] [address] [place], [address]   ENDOSCOPY PROCEDURE REPORT  PATIENT: Tyrone, Hall  MR#: [id number] BIRTHDATE: 1930/08/17 , 82  yrs. old GENDER: Male ENDOSCOPIST: R.  Garfield Cornea, MD FACP FACG REFERRED BY:  Tula Nakayama, M.D. PROCEDURE DATE:  10/29/2013 PROCEDURE:     EGD with gastric biopsy  INDICATIONS:     Iron deficiency anemia; Hemoccult positive stool; patient currently declines colonoscopy  INFORMED CONSENT:   The risks, benefits, limitations, alternatives and imponderables have been discussed.  The potential for biopsy, esophogeal dilation, etc. have also been reviewed.  Questions have been answered.  All parties agreeable.  Please see the history and physical in the medical record for more information.  MEDICATIONS:  Versed 2 mg IV and Demerol 25 mg IV. Xylocaine gel orally. Zofran 4 mg IV.  DESCRIPTION OF PROCEDURE:   The EG-[id number] ([id number])  endoscope was introduced through the mouth and advanced to the second portion of the duodenum without difficulty or limitations.  The mucosal surfaces were surveyed very carefully during advancement of the scope and upon withdrawal.  Retroflexion view of the proximal stomach and esophagogastric junction was performed.      FINDINGS:    Normal esophagus. Stomach empty. Antral erosions. No ulcer or infiltrating process. Patent pylorus. Normal first and second portion of the duodenum  THERAPEUTIC / DIAGNOSTIC MANEUVERS PERFORMED:  Biopsies abnormal gastric mucosa taken for histologic   COMPLICATIONS:  None  IMPRESSION:  Gastric erosions of uncertain significance-status post gastric biopsy  RECOMMENDATIONS:   Followup on pathology. Further recommendations to follow.    _______________________________ R. Garfield Cornea, MD FACP [facility] eSigned:  R. Garfield Cornea, MD FACP [facility] 10/29/2013 1:05 PM     CC:

## 2013-10-31 ENCOUNTER — Encounter (HOSPITAL_COMMUNITY): Payer: Self-pay | Admitting: Internal Medicine

## 2013-10-31 ENCOUNTER — Encounter: Payer: Self-pay | Admitting: Internal Medicine

## 2013-12-02 ENCOUNTER — Encounter: Payer: Medicare Other | Admitting: Family Medicine

## 2013-12-24 ENCOUNTER — Ambulatory Visit (INDEPENDENT_AMBULATORY_CARE_PROVIDER_SITE_OTHER): Payer: Medicare Other

## 2013-12-24 ENCOUNTER — Encounter (INDEPENDENT_AMBULATORY_CARE_PROVIDER_SITE_OTHER): Payer: Self-pay

## 2013-12-24 VITALS — BP 150/80 | Wt 208.0 lb

## 2013-12-24 DIAGNOSIS — E538 Deficiency of other specified B group vitamins: Secondary | ICD-10-CM

## 2013-12-24 MED ORDER — CYANOCOBALAMIN 1000 MCG/ML IJ SOLN
1000.0000 ug | Freq: Once | INTRAMUSCULAR | Status: AC
  Administered 2013-12-24: 1000 ug via INTRAMUSCULAR

## 2013-12-24 NOTE — Progress Notes (Signed)
Received injection with no complications

## 2014-01-23 ENCOUNTER — Encounter: Payer: Medicare Other | Admitting: Family Medicine

## 2014-02-24 ENCOUNTER — Other Ambulatory Visit: Payer: Self-pay | Admitting: Family Medicine

## 2014-03-17 ENCOUNTER — Ambulatory Visit (INDEPENDENT_AMBULATORY_CARE_PROVIDER_SITE_OTHER): Payer: Medicare Other

## 2014-03-17 ENCOUNTER — Ambulatory Visit (INDEPENDENT_AMBULATORY_CARE_PROVIDER_SITE_OTHER): Payer: Medicare Other | Admitting: Family Medicine

## 2014-03-17 VITALS — BP 152/86 | HR 76 | Resp 18 | Ht 68.0 in | Wt 193.0 lb

## 2014-03-17 DIAGNOSIS — E538 Deficiency of other specified B group vitamins: Secondary | ICD-10-CM

## 2014-03-17 DIAGNOSIS — E1121 Type 2 diabetes mellitus with diabetic nephropathy: Secondary | ICD-10-CM | POA: Diagnosis not present

## 2014-03-17 DIAGNOSIS — E785 Hyperlipidemia, unspecified: Secondary | ICD-10-CM | POA: Diagnosis not present

## 2014-03-17 DIAGNOSIS — I10 Essential (primary) hypertension: Secondary | ICD-10-CM

## 2014-03-17 DIAGNOSIS — E1129 Type 2 diabetes mellitus with other diabetic kidney complication: Secondary | ICD-10-CM | POA: Diagnosis not present

## 2014-03-17 DIAGNOSIS — I2510 Atherosclerotic heart disease of native coronary artery without angina pectoris: Secondary | ICD-10-CM

## 2014-03-17 DIAGNOSIS — Z23 Encounter for immunization: Secondary | ICD-10-CM

## 2014-03-17 DIAGNOSIS — F0390 Unspecified dementia without behavioral disturbance: Secondary | ICD-10-CM

## 2014-03-17 MED ORDER — CYANOCOBALAMIN 1000 MCG/ML IJ SOLN
1000.0000 ug | Freq: Once | INTRAMUSCULAR | Status: AC
  Administered 2014-03-17: 1000 ug via INTRAMUSCULAR

## 2014-03-17 MED ORDER — LOSARTAN POTASSIUM 25 MG PO TABS: 25.0000 mg | ORAL_TABLET | Freq: Every day | ORAL | Status: DC

## 2014-03-17 NOTE — Progress Notes (Signed)
   Subjective:    Patient ID: Tyrone Hall, male    DOB: 05/25/1930, 80 y.o.   MRN: [id number]  HPI The PT is here for follow up and re-evaluation of chronic medical conditions, medication management and review of any available recent lab and radiology data.  Preventive health is updated, specifically  Cancer screening and Immunization.   Questions or concerns regarding consultations or procedures which the PT has had in the interim are  addressed. The PT denies any adverse reactions to current medications since the last visit.  There are no new concerns.  There are no specific complaints       Review of Systems See HPI Denies recent fever or chills. Denies sinus pressure, nasal congestion, ear pain or sore throat. Denies chest congestion, productive cough or wheezing. Denies chest pains, palpitations and leg swelling Denies abdominal pain, nausea, vomiting,diarrhea or constipation.   Denies dysuria, frequency, hesitancy or incontinence. Denies joint pain, swelling and limitation in mobility. Denies headaches, seizures, numbness, or tingling. Denies depression, anxiety or insomnia. Denies skin break down or rash.        Objective:   Physical Exam BP 152/86 mmHg  Pulse 76  Resp 18  Ht 5\' 8"  (1.727 m)  Wt 193 lb (87.544 kg)  BMI 29.35 kg/m2  SpO2 96% Patient alert and oriented and in no cardiopulmonary distress.  HEENT: No facial asymmetry, EOMI,   oropharynx pink and moist.  Neck supple no JVD, no mass.  Chest: Clear to auscultation bilaterally.  CVS: S1, S2 no murmurs, no S3.Regular rate.  ABD: Soft non tender.   Ext: No edema  MS: Adequate ROM spine, shoulders, hips and knees.  Skin: Intact, no ulcerations or rash noted.  Psych: Good eye contact, normal affect. Memory intact not anxious or depressed appearing.  CNS: CN 2-12 intact, power,  normal throughout.no focal deficits noted.        Assessment & Plan:  Essential hypertension,  benign Controlled, no change in medication DASH diet and commitment to daily physical activity for a minimum of 30 minutes discussed and encouraged, as a part of hypertension management. The importance of attaining a healthy weight is also discussed.   Diabetes mellitus with kidney disease Improved and well controlled , reduce metformin dose Patient advised to reduce carb and sweets, commit to regular physical activity, take meds as prescribed, test blood as directed, and attempt to lose weight, to improve blood sugar control.   SENILE DEMENTIA, UNCOMPLICATED Controlled, no change in medication   Hyperlipidemia LDL goal <100 Controlled, no change in medication Hyperlipidemia:Low fat diet discussed and encouraged.    B12 deficiency B 12 administered at OV  Need for prophylactic vaccination and inoculation against influenza Vaccine administered at visit.

## 2014-03-17 NOTE — Patient Instructions (Addendum)
F/U in 4 month, with foot exam  Flu vaccine today, also B12 injection    Lipid, cmp and EGFR, HBa1C today  New additional medication for blood pressure is cozaar one daily  Keep well and call if you need me

## 2014-03-18 ENCOUNTER — Other Ambulatory Visit: Payer: Self-pay | Admitting: Family Medicine

## 2014-03-18 LAB — LIPID PANEL
CHOL/HDL RATIO: 4.3 ratio
Cholesterol: 151 mg/dL (ref 0–200)
HDL: 35 mg/dL — AB (ref >39)
LDL Cholesterol: 95 mg/dL (ref 0–99)
TRIGLYCERIDES: 107 mg/dL (ref <150)
VLDL: 21 mg/dL (ref 0–40)

## 2014-03-18 LAB — COMPLETE METABOLIC PANEL WITH GFR
ALBUMIN: 3.8 g/dL (ref 3.5–5.2)
ALK PHOS: 51 U/L (ref 39–117)
ALT: 9 U/L (ref 0–53)
AST: 14 U/L (ref 0–37)
BUN: 9 mg/dL (ref 6–23)
CO2: 29 mEq/L (ref 19–32)
CREATININE: 1.36 mg/dL — AB (ref 0.50–1.35)
Calcium: 8.5 mg/dL (ref 8.4–10.5)
Chloride: 101 mEq/L (ref 96–112)
GFR, EST NON AFRICAN AMERICAN: 48 mL/min — AB
GFR, Est African American: 56 mL/min — ABNORMAL LOW
GLUCOSE: 85 mg/dL (ref 70–99)
POTASSIUM: 3.8 meq/L (ref 3.5–5.3)
Sodium: 140 mEq/L (ref 135–145)
Total Bilirubin: 0.6 mg/dL (ref 0.2–1.2)
Total Protein: 6.8 g/dL (ref 6.0–8.3)

## 2014-03-18 LAB — HEMOGLOBIN A1C
Hgb A1c MFr Bld: 6 % — ABNORMAL HIGH (ref <5.7)
Mean Plasma Glucose: 126 mg/dL — ABNORMAL HIGH (ref <117)

## 2014-03-19 ENCOUNTER — Other Ambulatory Visit: Payer: Self-pay

## 2014-03-19 MED ORDER — METFORMIN HCL 500 MG PO TABS: 500.0000 mg | ORAL_TABLET | Freq: Every day | ORAL | Status: DC

## 2014-04-14 ENCOUNTER — Encounter: Payer: Self-pay | Admitting: Family Medicine

## 2014-04-14 DIAGNOSIS — Z23 Encounter for immunization: Secondary | ICD-10-CM | POA: Insufficient documentation

## 2014-04-14 NOTE — Assessment & Plan Note (Signed)
Improved and well controlled , reduce metformin dose Patient advised to reduce carb and sweets, commit to regular physical activity, take meds as prescribed, test blood as directed, and attempt to lose weight, to improve blood sugar control.

## 2014-04-14 NOTE — Assessment & Plan Note (Signed)
Controlled, no change in medication  

## 2014-04-14 NOTE — Assessment & Plan Note (Signed)
Controlled, no change in medication DASH diet and commitment to daily physical activity for a minimum of 30 minutes discussed and encouraged, as a part of hypertension management. The importance of attaining a healthy weight is also discussed.  

## 2014-04-14 NOTE — Assessment & Plan Note (Signed)
Vaccine administered at visit.  

## 2014-04-14 NOTE — Assessment & Plan Note (Signed)
B 12 administered at OV

## 2014-04-14 NOTE — Assessment & Plan Note (Signed)
Controlled, no change in medication Hyperlipidemia:Low fat diet discussed and encouraged.  \ 

## 2014-04-21 ENCOUNTER — Ambulatory Visit: Payer: Medicare Other

## 2014-04-22 ENCOUNTER — Encounter: Payer: Self-pay | Admitting: Internal Medicine

## 2014-04-28 ENCOUNTER — Ambulatory Visit: Payer: Medicare Other | Admitting: Gastroenterology

## 2014-05-01 ENCOUNTER — Other Ambulatory Visit: Payer: Self-pay | Admitting: Family Medicine

## 2014-05-22 ENCOUNTER — Ambulatory Visit: Payer: Medicare Other | Admitting: Gastroenterology

## 2014-05-22 ENCOUNTER — Encounter: Payer: Self-pay | Admitting: Internal Medicine

## 2014-05-22 ENCOUNTER — Encounter: Payer: Self-pay | Admitting: Gastroenterology

## 2014-05-22 ENCOUNTER — Telehealth: Payer: Self-pay | Admitting: Gastroenterology

## 2014-05-22 NOTE — Telephone Encounter (Signed)
PATIENT WAS A NO SHOW 06/11/14 AND LETTER WAS SENT

## 2014-06-12 ENCOUNTER — Encounter: Payer: Self-pay | Admitting: Gastroenterology

## 2014-06-12 ENCOUNTER — Ambulatory Visit (INDEPENDENT_AMBULATORY_CARE_PROVIDER_SITE_OTHER): Payer: Medicare Other | Admitting: Gastroenterology

## 2014-06-12 VITALS — BP 160/75 | HR 60 | Temp 97.0°F | Ht 67.0 in | Wt 210.8 lb

## 2014-06-12 DIAGNOSIS — D509 Iron deficiency anemia, unspecified: Secondary | ICD-10-CM

## 2014-06-12 DIAGNOSIS — R195 Other fecal abnormalities: Secondary | ICD-10-CM | POA: Diagnosis not present

## 2014-06-12 NOTE — Progress Notes (Signed)
Primary Care Physician:  Tula Nakayama, MD  Primary Gastroenterologist:  Garfield Cornea, MD   Chief Complaint  Patient presents with  . Colonoscopy    HPI:  Tyrone Hall is a 81 y.o. male here for follow up of IDA.  Seen in 09/2013 for IDA, ifobt +. Patient had EGD but refused colonoscopy. He has gastric erosions with benign biopsies. Last colonoscopy in 2009 also with EGD for iron deficiency anemia revealed evidence of healing ulcer. At that time he was on Celebrex. He was offered a small bowel capsule endoscopy (2009)but he failed to show for the study.  He presents along today, daughter dropped him off. He is not sure what medications he takes, believes he takes five meds per day. List below was obtained from the pharmacy.   Patient denies any abdominal pain, vomiting, heartburn, dysphagia, anorexia, weight loss, constipation, diarrhea, melena, rectal bleeding. Unclear how reliable his history is with baseline dementia.   Current Outpatient Prescriptions  Medication Sig Dispense Refill  . amLODipine (NORVASC) 5 MG tablet TAKE ONE TABLET BY MOUTH ONCE DAILY. 30 tablet 1  . aspirin (ASPIRIN LOW DOSE) 81 MG EC tablet Take 81 mg by mouth daily.     Marland Kitchen donepezil (ARICEPT) 10 MG tablet TAKE (1) TABLET BY MOUTH AT BEDTIME. 30 tablet 1  . losartan (COZAAR) 25 MG tablet Take 1 tablet (25 mg total) by mouth daily. 30 tablet 5  . lovastatin (MEVACOR) 40 MG tablet TAKE (1) TABLET BY MOUTH AT BEDTIME. 30 tablet 1  . metFORMIN (GLUCOPHAGE) 500 MG tablet Take 1 tablet (500 mg total) by mouth daily with breakfast. 30 tablet 5   No current facility-administered medications for this visit.    Allergies as of 06/12/2014  . (No Known Allergies)    Past Medical History  Diagnosis Date  . Coronary atherosclerosis of native coronary artery     Details not clear  . OA (osteoarthritis) of knee   . Type 2 diabetes mellitus   . Mixed hyperlipidemia   . Essential hypertension, benign   . TIA  (transient ischemic attack)   . B12 deficiency   . Carotid artery disease     60-79% RICA and 09-98% LICA -  3/38  . Acute biliary pancreatitis   . Dementia   . Atrial fibrillation   . Anemia   . Stroke 2008    Past Surgical History  Procedure Laterality Date  . Cholecystectomy    . Hemorrhoid surgery    . Common bile duct stone removal      s/p ercp with biliary and pancreatic stent placement and subsequent removal 2005  . Colonoscopy    01/20/2008    SNK:NLZJQBHALPF, normal rectum, left-sided diverticula  . Esophagogastroduodenoscopy    01/20/2008    XTK:WIOXBD esophagus, small hiatal hernia, healing ulcer scar antrum . bx mild inflammation c/w with healed ulceration. no h.pylori, small bowel bx negative.  . Esophagogastroduodenoscopy N/A 10/29/2013    RMR: gastric erosinons of uncertain significance-status post gastric biopsy.    Family History  Problem Relation Age of Onset  . Diabetes type II    . Hypertension Son   . Colon cancer Neg Hx     History   Social History  . Marital Status: Divorced    Spouse Name: N/A  . Number of Children: 2  . Years of Education: N/A   Occupational History  . Retired   .     Social History Main Topics  . Smoking status: Former Smoker  Types: Cigarettes    Quit date: 05/18/1983  . Smokeless tobacco: Never Used     Comment: Quit x 50 years  . Alcohol Use: No  . Drug Use: No  . Sexual Activity: Not on file   Other Topics Concern  . Not on file   Social History Narrative      VXY:IAXKPVV reports negative review of systems  General: Negative for anorexia, weight loss, fever, chills, fatigue, weakness. Eyes: Negative for vision changes.  ENT: Negative for hoarseness, difficulty swallowing , nasal congestion. CV: Negative for chest pain, angina, palpitations, dyspnea on exertion, peripheral edema.  Respiratory: Negative for dyspnea at rest, dyspnea on exertion, cough, sputum, wheezing.  GI: See history of present  illness. GU:  Negative for dysuria, hematuria, urinary incontinence, urinary frequency, nocturnal urination.  MS: Negative for joint pain, low back pain.  Derm: Negative for rash or itching.  Neuro: Negative for weakness, abnormal sensation, seizure, frequent headaches, memory loss, confusion.  Psych: Negative for anxiety, depression, suicidal ideation, hallucinations.  Endo: Negative for unusual weight change.  Heme: Negative for bruising or bleeding. Allergy: Negative for rash or hives.    Physical Examination:  BP 160/75 mmHg  Pulse 60  Temp(Src) 97 F (36.1 C) (Oral)  Ht 5\' 7"  (1.702 m)  Wt 210 lb 12.8 oz (95.618 kg)  BMI 33.01 kg/m2   General: Well-nourished, well-developed in no acute distress.  Head: Normocephalic, atraumatic.   Eyes: Conjunctiva pink, no icterus. Mouth: Oropharyngeal mucosa moist and pink , no lesions erythema or exudate. Neck: Supple without thyromegaly, masses, or lymphadenopathy.  Lungs: Clear to auscultation bilaterally.  Heart: Regular rate and rhythm, no murmurs rubs or gallops.  Abdomen: Bowel sounds are normal, nontender, nondistended, no hepatosplenomegaly or masses, no abdominal bruits or    hernia , no rebound or guarding.   Rectal: Not performed  Extremities: No lower extremity edema. No clubbing or deformities.  Neuro: Alert and oriented x 4 , grossly normal neurologically.  Skin: Warm and dry, no rash or jaundice.   Psych: Alert and cooperative, normal mood and affect.  Labs: Lab Results  Component Value Date   WBC 7.8 10/10/2013   HGB 13.2 10/10/2013   HCT 38.0* 10/10/2013   MCV 71.4* 10/10/2013   PLT 138* 10/10/2013   Lab Results  Component Value Date   IRON 17* 07/23/2013   FERRITIN 45 07/23/2013     Imaging Studies: No results found.

## 2014-06-12 NOTE — Patient Instructions (Signed)
1. We will update your labs to see if you still have low blood counts (anemia). If you do, we will likely offer you a colonoscopy as discussed today. If your daughter has any questions, if it is ok with you, she can call us at [phone].

## 2014-06-12 NOTE — Assessment & Plan Note (Signed)
History of iron deficiency anemia and Hemoccult-positive stool last year with limited workup including EGD as outlined above. Patient and daughter both declined having a colonoscopy at the time. His last one was in 2009. He denies any GI symptoms. We need to update his labs. Based on anemia labs, may offer him a colonoscopy in the future, patient seems open to the idea today but his daughter's not present and she would be the L1 to discuss whether or not he would be compliant with the bowel prep. Await labs, further recommendations to follow.

## 2014-06-15 DIAGNOSIS — R195 Other fecal abnormalities: Secondary | ICD-10-CM | POA: Diagnosis not present

## 2014-06-15 DIAGNOSIS — D509 Iron deficiency anemia, unspecified: Secondary | ICD-10-CM | POA: Diagnosis not present

## 2014-06-15 LAB — CBC WITH DIFFERENTIAL/PLATELET
Basophils Absolute: 0 K/uL (ref 0.0–0.1)
Basophils Relative: 0 % (ref 0–1)
Eosinophils Absolute: 0.1 K/uL (ref 0.0–0.7)
Eosinophils Relative: 1 % (ref 0–5)
HCT: 37.2 % — ABNORMAL LOW (ref 39.0–52.0)
Hemoglobin: 12.7 g/dL — ABNORMAL LOW (ref 13.0–17.0)
Lymphocytes Relative: 31 % (ref 12–46)
Lymphs Abs: 2.2 K/uL (ref 0.7–4.0)
MCH: 27.4 pg (ref 26.0–34.0)
MCHC: 34.1 g/dL (ref 30.0–36.0)
MCV: 80.3 fL (ref 78.0–100.0)
MPV: 9.1 fL (ref 8.6–12.4)
Monocytes Absolute: 0.5 K/uL (ref 0.1–1.0)
Monocytes Relative: 7 % (ref 3–12)
Neutro Abs: 4.4 K/uL (ref 1.7–7.7)
Neutrophils Relative %: 61 % (ref 43–77)
Platelets: 176 K/uL (ref 150–400)
RBC: 4.63 MIL/uL (ref 4.22–5.81)
RDW: 15.7 % — ABNORMAL HIGH (ref 11.5–15.5)
WBC: 7.2 K/uL (ref 4.0–10.5)

## 2014-06-15 NOTE — Progress Notes (Signed)
cc'ed to pcp °

## 2014-06-16 LAB — IRON AND TIBC
%SAT: 17 % — AB (ref 20–55)
Iron: 63 ug/dL (ref 42–165)
TIBC: 366 ug/dL (ref 215–435)
UIBC: 303 ug/dL (ref 125–400)

## 2014-06-16 LAB — FERRITIN: Ferritin: 56 ng/mL (ref 22–322)

## 2014-06-22 NOTE — Progress Notes (Signed)
Quick Note:  Hgb slightly lower but iron indices improved.  Let's have him repeat one more ifobt.  Repeat CBC, ferritin in 8 weeks. ______

## 2014-07-01 ENCOUNTER — Other Ambulatory Visit: Payer: Self-pay | Admitting: Gastroenterology

## 2014-07-01 DIAGNOSIS — D509 Iron deficiency anemia, unspecified: Secondary | ICD-10-CM

## 2014-07-03 ENCOUNTER — Other Ambulatory Visit: Payer: Self-pay | Admitting: Family Medicine

## 2014-07-28 ENCOUNTER — Ambulatory Visit: Payer: Medicare Other | Admitting: Family Medicine

## 2014-07-28 ENCOUNTER — Encounter: Payer: Self-pay | Admitting: *Deleted

## 2014-08-07 ENCOUNTER — Other Ambulatory Visit: Payer: Self-pay | Admitting: Family Medicine

## 2014-08-11 ENCOUNTER — Other Ambulatory Visit: Payer: Self-pay

## 2014-08-11 ENCOUNTER — Encounter: Payer: Self-pay | Admitting: Family

## 2014-08-11 DIAGNOSIS — D509 Iron deficiency anemia, unspecified: Secondary | ICD-10-CM

## 2014-08-12 ENCOUNTER — Ambulatory Visit: Payer: Medicare Other | Admitting: Family

## 2014-08-12 ENCOUNTER — Other Ambulatory Visit (HOSPITAL_COMMUNITY): Payer: Medicare Other

## 2014-09-04 ENCOUNTER — Telehealth: Payer: Self-pay | Admitting: Family Medicine

## 2014-09-04 ENCOUNTER — Other Ambulatory Visit: Payer: Self-pay

## 2014-09-04 DIAGNOSIS — E785 Hyperlipidemia, unspecified: Secondary | ICD-10-CM

## 2014-09-04 DIAGNOSIS — I10 Essential (primary) hypertension: Secondary | ICD-10-CM

## 2014-09-04 DIAGNOSIS — E1121 Type 2 diabetes mellitus with diabetic nephropathy: Secondary | ICD-10-CM

## 2014-09-04 NOTE — Telephone Encounter (Signed)
Sent!

## 2014-09-07 ENCOUNTER — Telehealth: Payer: Self-pay

## 2014-09-07 NOTE — Telephone Encounter (Signed)
Spoke with the pts daughter. They are having a difficult time trying to get him to do the ifobt. She had to make him to the last one he did and he is refusing to do another one. She wants to know if he can just have a tcs and get it over with?

## 2014-09-07 NOTE — Telephone Encounter (Signed)
Pt Daughter Hipolito Bayley)  is calling in regards to a stool specimen the doctor wants him to have done. Pt wants doctor call them back to answer some questions she has. [phone]

## 2014-09-08 NOTE — Telephone Encounter (Signed)
Tyrone Hall, please schedule ov and let pts daughter know.

## 2014-09-08 NOTE — Telephone Encounter (Signed)
Offer OV to discuss. They have been reluctant in the past due to patient's dementia.

## 2014-09-17 ENCOUNTER — Ambulatory Visit: Payer: Medicare Other | Admitting: Family Medicine

## 2014-09-17 ENCOUNTER — Encounter: Payer: Self-pay | Admitting: Family Medicine

## 2014-09-21 ENCOUNTER — Encounter: Payer: Self-pay | Admitting: Family

## 2014-09-21 NOTE — Telephone Encounter (Signed)
I have scheduled OV for 6/29 at 1030 with LSL and will try calling his daughter Hipolito Bayley) in the morning to see if this date and time will work for them.

## 2014-09-22 ENCOUNTER — Telehealth: Payer: Self-pay | Admitting: Internal Medicine

## 2014-09-22 NOTE — Telephone Encounter (Signed)
Daughter is aware of OV on 7/1 at 10am with AS. She needs for OV to be on a Friday so she can come with patient. I offered her the [place] for 6/29 with but it was changed to the Friday appt with AS. She has questions about what the OV is regarding. I told her I believe it was to discuss his colonoscopy and thoughts of maybe scheduling another colonoscopy.  I told the daughter that I would have the nurse to clarify the reason of the OV and she agreed.

## 2014-09-22 NOTE — Telephone Encounter (Signed)
I spoke with the pts daughterHipolito Hall, she has healthcare POA for her father. She said the only way she is able to get him to do any medical procedures is to get a date and just tell him that is what he has to do and he will do it. She is concerned that if he comes in for an ov that he will refuse to have it done and then she wont be able to get him to do it at all. She also stated that she works two jobs and was unable to sit around waiting for him to have a bm in order to get the ifobt, that is the only reason she couldn't get him to do that. Per RMR result letter, he wanted pt to have a tcs.   Does he have to come in for an ov or can we just triage him per daughters request?   pts daughter is aware that RMR will be out of town for a couple of weeks and that I will need to talk to LSL and possible wait and talk with RMR. She verbalized understanding.

## 2014-09-23 NOTE — Telephone Encounter (Signed)
Patients daughter- Hipolito Bayley is aware. She said she is taking him Friday to have blood work done for [place] and will have ours done at the same time. I advised her to tell the lab that he needed blood work done for both offices so nothing will be overlooked. She verbalized understanding.

## 2014-09-23 NOTE — Telephone Encounter (Signed)
Last seen in 05/2014. At this point, I would like to repeat his labs. If his anemia has resolved then he will not need a colonoscopy.

## 2014-09-25 ENCOUNTER — Encounter (HOSPITAL_COMMUNITY): Payer: Medicare Other

## 2014-09-25 ENCOUNTER — Ambulatory Visit: Payer: Medicare Other | Admitting: Family

## 2014-09-25 DIAGNOSIS — E1121 Type 2 diabetes mellitus with diabetic nephropathy: Secondary | ICD-10-CM | POA: Diagnosis not present

## 2014-09-25 DIAGNOSIS — E785 Hyperlipidemia, unspecified: Secondary | ICD-10-CM | POA: Diagnosis not present

## 2014-09-25 DIAGNOSIS — E1129 Type 2 diabetes mellitus with other diabetic kidney complication: Secondary | ICD-10-CM | POA: Diagnosis not present

## 2014-09-26 LAB — COMPLETE METABOLIC PANEL WITH GFR
ALBUMIN: 3.9 g/dL (ref 3.5–5.2)
ALK PHOS: 76 U/L (ref 39–117)
ALT: 15 U/L (ref 0–53)
AST: 20 U/L (ref 0–37)
BILIRUBIN TOTAL: 1 mg/dL (ref 0.2–1.2)
BUN: 7 mg/dL (ref 6–23)
CHLORIDE: 101 meq/L (ref 96–112)
CO2: 29 mEq/L (ref 19–32)
Calcium: 9.1 mg/dL (ref 8.4–10.5)
Creat: 1.42 mg/dL — ABNORMAL HIGH (ref 0.50–1.35)
GFR, EST AFRICAN AMERICAN: 53 mL/min — AB
GFR, Est Non African American: 46 mL/min — ABNORMAL LOW
GLUCOSE: 119 mg/dL — AB (ref 70–99)
Potassium: 3.8 mEq/L (ref 3.5–5.3)
Sodium: 139 mEq/L (ref 135–145)
Total Protein: 7.4 g/dL (ref 6.0–8.3)

## 2014-09-26 LAB — LIPID PANEL
CHOL/HDL RATIO: 6.9 ratio
Cholesterol: 243 mg/dL — ABNORMAL HIGH (ref 0–200)
HDL: 35 mg/dL — AB (ref >=40)
LDL Cholesterol: 173 mg/dL — ABNORMAL HIGH (ref 0–99)
Triglycerides: 173 mg/dL — ABNORMAL HIGH (ref <150)
VLDL: 35 mg/dL (ref 0–40)

## 2014-09-26 LAB — HEMOGLOBIN A1C
Hgb A1c MFr Bld: 7.3 % — ABNORMAL HIGH (ref <5.7)
Mean Plasma Glucose: 163 mg/dL — ABNORMAL HIGH (ref <117)

## 2014-10-07 ENCOUNTER — Encounter: Payer: Self-pay | Admitting: Family Medicine

## 2014-10-07 ENCOUNTER — Ambulatory Visit (INDEPENDENT_AMBULATORY_CARE_PROVIDER_SITE_OTHER): Payer: Medicare Other | Admitting: Family Medicine

## 2014-10-07 VITALS — BP 146/76 | HR 82 | Resp 16 | Ht 67.0 in | Wt 206.0 lb

## 2014-10-07 DIAGNOSIS — B351 Tinea unguium: Secondary | ICD-10-CM

## 2014-10-07 DIAGNOSIS — I10 Essential (primary) hypertension: Secondary | ICD-10-CM

## 2014-10-07 DIAGNOSIS — Z23 Encounter for immunization: Secondary | ICD-10-CM

## 2014-10-07 DIAGNOSIS — E1121 Type 2 diabetes mellitus with diabetic nephropathy: Secondary | ICD-10-CM

## 2014-10-07 DIAGNOSIS — F0390 Unspecified dementia without behavioral disturbance: Secondary | ICD-10-CM

## 2014-10-07 DIAGNOSIS — I482 Chronic atrial fibrillation, unspecified: Secondary | ICD-10-CM

## 2014-10-07 DIAGNOSIS — I6523 Occlusion and stenosis of bilateral carotid arteries: Secondary | ICD-10-CM | POA: Diagnosis not present

## 2014-10-07 DIAGNOSIS — E785 Hyperlipidemia, unspecified: Secondary | ICD-10-CM | POA: Diagnosis not present

## 2014-10-07 DIAGNOSIS — E538 Deficiency of other specified B group vitamins: Secondary | ICD-10-CM

## 2014-10-07 MED ORDER — LOVASTATIN 40 MG PO TABS: ORAL_TABLET | ORAL | Status: DC

## 2014-10-07 MED ORDER — LOSARTAN POTASSIUM 25 MG PO TABS: 25.0000 mg | ORAL_TABLET | Freq: Every day | ORAL | Status: DC

## 2014-10-07 MED ORDER — DONEPEZIL HCL 10 MG PO TABS: ORAL_TABLET | ORAL | Status: DC

## 2014-10-07 MED ORDER — METFORMIN HCL 500 MG PO TABS: 500.0000 mg | ORAL_TABLET | Freq: Every day | ORAL | Status: DC

## 2014-10-07 NOTE — Assessment & Plan Note (Signed)
After obtaining informed consent, the vaccine is  administered by LPN.  

## 2014-10-07 NOTE — Patient Instructions (Signed)
Nurse BP check in 4 weeks  Blood pressure and cholesterol are too high, IMPORTANT that you take your medication we will also contact your daughter  Prevnar and B12 injection today  Microalb from office today (if possible)  Be careful not to fall  MD follow up in 4 months  Fasting lipid, cmp and EGFr , HBa1C in 4 months

## 2014-10-14 ENCOUNTER — Ambulatory Visit: Payer: Medicare Other | Admitting: Gastroenterology

## 2014-10-16 ENCOUNTER — Ambulatory Visit: Payer: Medicare Other | Admitting: Gastroenterology

## 2014-10-16 ENCOUNTER — Encounter: Payer: Self-pay | Admitting: *Deleted

## 2014-10-25 DIAGNOSIS — I6523 Occlusion and stenosis of bilateral carotid arteries: Secondary | ICD-10-CM | POA: Diagnosis not present

## 2014-10-25 DIAGNOSIS — I350 Nonrheumatic aortic (valve) stenosis: Secondary | ICD-10-CM | POA: Diagnosis not present

## 2014-10-25 DIAGNOSIS — E119 Type 2 diabetes mellitus without complications: Secondary | ICD-10-CM | POA: Diagnosis not present

## 2014-10-25 DIAGNOSIS — Z8673 Personal history of transient ischemic attack (TIA), and cerebral infarction without residual deficits: Secondary | ICD-10-CM | POA: Diagnosis not present

## 2014-10-25 DIAGNOSIS — I517 Cardiomegaly: Secondary | ICD-10-CM | POA: Diagnosis not present

## 2014-10-25 DIAGNOSIS — Z79899 Other long term (current) drug therapy: Secondary | ICD-10-CM | POA: Diagnosis not present

## 2014-10-25 DIAGNOSIS — I4891 Unspecified atrial fibrillation: Secondary | ICD-10-CM | POA: Diagnosis not present

## 2014-10-25 DIAGNOSIS — Z7982 Long term (current) use of aspirin: Secondary | ICD-10-CM | POA: Diagnosis not present

## 2014-10-25 DIAGNOSIS — E785 Hyperlipidemia, unspecified: Secondary | ICD-10-CM | POA: Diagnosis present

## 2014-10-25 DIAGNOSIS — I10 Essential (primary) hypertension: Secondary | ICD-10-CM | POA: Diagnosis not present

## 2014-10-25 DIAGNOSIS — Z87891 Personal history of nicotine dependence: Secondary | ICD-10-CM | POA: Diagnosis not present

## 2014-10-25 DIAGNOSIS — E860 Dehydration: Secondary | ICD-10-CM | POA: Diagnosis not present

## 2014-10-25 DIAGNOSIS — R410 Disorientation, unspecified: Secondary | ICD-10-CM | POA: Diagnosis not present

## 2014-10-25 DIAGNOSIS — I638 Other cerebral infarction: Secondary | ICD-10-CM | POA: Diagnosis not present

## 2014-10-25 DIAGNOSIS — I618 Other nontraumatic intracerebral hemorrhage: Secondary | ICD-10-CM | POA: Diagnosis not present

## 2014-10-25 DIAGNOSIS — I639 Cerebral infarction, unspecified: Secondary | ICD-10-CM | POA: Diagnosis not present

## 2014-10-25 DIAGNOSIS — R404 Transient alteration of awareness: Secondary | ICD-10-CM | POA: Diagnosis not present

## 2014-10-25 DIAGNOSIS — R4182 Altered mental status, unspecified: Secondary | ICD-10-CM | POA: Diagnosis not present

## 2014-10-25 DIAGNOSIS — R531 Weakness: Secondary | ICD-10-CM | POA: Diagnosis not present

## 2014-10-26 DIAGNOSIS — R4182 Altered mental status, unspecified: Secondary | ICD-10-CM | POA: Diagnosis not present

## 2014-10-27 ENCOUNTER — Telehealth: Payer: Self-pay | Admitting: *Deleted

## 2014-10-27 NOTE — Telephone Encounter (Signed)
Pt's daughter called back stating pt has been discharged, pt is scheduled 11/05/14 and is aware of this appt.

## 2014-10-27 NOTE — Telephone Encounter (Signed)
Pt's daughter called stating pt is in [place] now he was admitted for dehydration and they think he may have had a mini stroke, it is a possibility pt will be discharged today but that has not happened as of yet, Pt's daughter wanted [place] to be aware he is in the hospital, and I made her aware to call and let us know when he does get discharged so we can schedule him for a hospital follow up with Dr. Moshe Cipro.

## 2014-11-03 DIAGNOSIS — I10 Essential (primary) hypertension: Secondary | ICD-10-CM | POA: Diagnosis not present

## 2014-11-03 DIAGNOSIS — R531 Weakness: Secondary | ICD-10-CM | POA: Diagnosis not present

## 2014-11-03 DIAGNOSIS — I4891 Unspecified atrial fibrillation: Secondary | ICD-10-CM | POA: Diagnosis not present

## 2014-11-03 DIAGNOSIS — F0390 Unspecified dementia without behavioral disturbance: Secondary | ICD-10-CM | POA: Diagnosis not present

## 2014-11-03 DIAGNOSIS — E119 Type 2 diabetes mellitus without complications: Secondary | ICD-10-CM | POA: Diagnosis not present

## 2014-11-03 DIAGNOSIS — I69398 Other sequelae of cerebral infarction: Secondary | ICD-10-CM | POA: Diagnosis not present

## 2014-11-05 ENCOUNTER — Ambulatory Visit (INDEPENDENT_AMBULATORY_CARE_PROVIDER_SITE_OTHER): Payer: Medicare Other | Admitting: Family Medicine

## 2014-11-05 ENCOUNTER — Encounter: Payer: Self-pay | Admitting: Family Medicine

## 2014-11-05 VITALS — BP 144/82 | HR 79 | Resp 16 | Ht 67.0 in | Wt 203.0 lb

## 2014-11-05 DIAGNOSIS — I639 Cerebral infarction, unspecified: Secondary | ICD-10-CM

## 2014-11-05 DIAGNOSIS — I6523 Occlusion and stenosis of bilateral carotid arteries: Secondary | ICD-10-CM

## 2014-11-05 DIAGNOSIS — R972 Elevated prostate specific antigen [PSA]: Secondary | ICD-10-CM

## 2014-11-05 DIAGNOSIS — R32 Unspecified urinary incontinence: Secondary | ICD-10-CM | POA: Insufficient documentation

## 2014-11-05 DIAGNOSIS — E1121 Type 2 diabetes mellitus with diabetic nephropathy: Secondary | ICD-10-CM

## 2014-11-05 DIAGNOSIS — F0390 Unspecified dementia without behavioral disturbance: Secondary | ICD-10-CM

## 2014-11-05 DIAGNOSIS — E538 Deficiency of other specified B group vitamins: Secondary | ICD-10-CM | POA: Diagnosis not present

## 2014-11-05 DIAGNOSIS — I482 Chronic atrial fibrillation, unspecified: Secondary | ICD-10-CM

## 2014-11-05 DIAGNOSIS — I10 Essential (primary) hypertension: Secondary | ICD-10-CM

## 2014-11-05 DIAGNOSIS — E1129 Type 2 diabetes mellitus with other diabetic kidney complication: Secondary | ICD-10-CM | POA: Diagnosis not present

## 2014-11-05 DIAGNOSIS — I119 Hypertensive heart disease without heart failure: Secondary | ICD-10-CM

## 2014-11-05 MED ORDER — CYANOCOBALAMIN 1000 MCG/ML IJ SOLN
1000.0000 ug | Freq: Once | INTRAMUSCULAR | Status: AC
  Administered 2014-11-05: 1000 ug via INTRAMUSCULAR

## 2014-11-05 NOTE — Progress Notes (Signed)
Subjective:    Patient ID: Tyrone Hall, male    DOB: 1931/03/09, 81 y.o.   MRN: [id number]  HPI Hospitalized at [facility], for 3 days dx CVA, dehydration Now has in home sitter 9am to 2pm, also getting in home PT twice weekly Has advanced home care involved , want tHN . Family Will discuss with GI re colonoscopy, Dr Sydell Axon  Refer for eye exam Noted to be less talkative then previously. His daughter and grandson , along with sitter service have him supervised for 22 of 24 hours daily. Appetite satisfactory. New incontinence of stool and urine noted   Review of Systems See HPI Accompanied by his daughter Tyrone Hall, and his sister , was also present for a small portion of the visit , she had to leave for work Daughter primarily  provides history, pt noted to be drowsy and not very communicative Denies recent fever or chills. Denies , nasal congestion, ear pain or sore throat. Denies chest congestion, productive cough . Denies chest pains,  and leg swelling Denies abdominal pain, nausea, vomiting,diarrhea or constipation.   Chronic  joint pain, with  limitation in mobility. . Denies depression, anxie or insomnia. Denies skin break down or rash.        Objective:   Physical Exam  BP 144/82 mmHg  Pulse 79  Resp 16  Ht 5\' 7"  (1.702 m)  Wt 203 lb (92.08 kg)  BMI 31.79 kg/m2  SpO2 99%  Patient  Drowsy, but arousable and interactive appropriately when engaged in conversation, he is  in no cardiopulmonary distress.  HEENT: Left  facial , EOMI,   oropharynx pink and moist.  Neck adequate but decreased ROM, no JVD, no mass.  Chest: Clear to auscultation bilaterally.  CVS: S1, S2 systolic  murmur, no S3.Iregulalrly irRegular rate.  ABD: Soft non tender.   Ext: No edema  MS: Decreased  ROM spine, shoulders, hips and knees.  Skin: Intact, no ulcerations or rash noted.  Psych: Good eye contact, flatl affect. Memory loss not anxious or depressed appearing.  CNS: CN 2-12  intact, grade 4 power in left upper and lower extremities      Assessment & Plan:  Essential hypertension, benign Sub optimal control, however , no change in management at this time, will re evaluate in 8 to 12 weeks  CAROTID STENOSIS In light of recent stroke, though minimally hemorrhagic  Will refer to CVTS for re evaluation  No significant  plaque based on recent carotid doppler at [facility]  Atrial fibrillation Chronic and unchanged, poor candidiate for anticoagulation based on fall risk, he is rate controlled on current medication  B12 deficiency B12 administered at visit IM  SENILE DEMENTIA, UNCOMPLICATED Overall slow progression of disease noted, does not have behavioral problems, no change in medication  Elevated PSA Will need to review need to see urology and arrange appt if he has not been for over 1 year , which is likely as he has been missing appts recently Last visit in 05/2013 reviewed, no f/u recommended by urology  Incontinence New incontinence of stool and urine , will request incontinence supplies  Diabetes mellitus with kidney disease Controlled, no change in medication Tyrone Hall is reminded of the importance of commitment to daily physical activity for 30 minutes or more, as able and the need to limit carbohydrate intake to 30 to 60 grams per meal to help with blood sugar control.   The need to take medication as prescribed, test blood sugar as directed,  and to call between visits if there is a concern that blood sugar is uncontrolled is also discussed.   Tyrone Hall is reminded of the importance of daily foot exam, annual eye examination, and good blood sugar, blood pressure and cholesterol control.  Diabetic Labs Latest Ref Rng 11/05/2014 09/25/2014 03/17/2014 07/23/2013 04/22/2013  HbA1c <5.7 % - 7.3(H) 6.0(H) 7.3(H) -  Microalbumin <2.0 mg/dL 1.6 - - - 2.54(H)  Micro/Creat Ratio 0.0 - 30.0 mg/g 6.3 - - - 7.1  Chol 0 - 200 mg/dL - 243(H) 151 - -  HDL >=40 mg/dL  - 35(L) 35(L) - -  Calc LDL 0 - 99 mg/dL - 173(H) 95 - -  Triglycerides <150 mg/dL - 173(H) 107 - -  Creatinine 0.50 - 1.35 mg/dL - 1.42(H) 1.36(H) 1.34 -   BP/Weight 11/05/2014 10/07/2014 06/12/2014 03/17/2014 12/24/2013 10/29/2013 5/78/4696  Systolic BP 295 284 132 440 102 725 366  Diastolic BP 82 76 75 86 80 87 98  Wt. (Lbs) 203 206 210.8 193 208 200 220  BMI 31.79 32.26 33.01 29.35 31.63 30.42 33.46   Foot/eye exam completion dates Latest Ref Rng 10/07/2014 07/23/2013  Eye Exam No Retinopathy - -  Foot Form Completion - Done Done         Acute CVA (cerebrovascular accident) Acute left caudate infarct noted during recent hospitalization at [facility], MRI study done on 10/26/2014. Pt presented confused and weak

## 2014-11-05 NOTE — Patient Instructions (Addendum)
F/U in Sept 15 or after, call if you need me before  Continue current medication  You are referred to [place] are referred for eye exam  Please get  lifeline , info wioill be given  B12 today and urine today  Chem7 and EGFR , HBA1C   Sept 10 or after

## 2014-11-06 DIAGNOSIS — I119 Hypertensive heart disease without heart failure: Secondary | ICD-10-CM | POA: Insufficient documentation

## 2014-11-06 DIAGNOSIS — I639 Cerebral infarction, unspecified: Secondary | ICD-10-CM | POA: Insufficient documentation

## 2014-11-06 LAB — MICROALBUMIN / CREATININE URINE RATIO
Creatinine, Urine: 253.3 mg/dL
MICROALB/CREAT RATIO: 6.3 mg/g (ref 0.0–30.0)
Microalb, Ur: 1.6 mg/dL (ref <2.0)

## 2014-11-06 NOTE — Assessment & Plan Note (Signed)
Controlled, no change in medication Tyrone Hall is reminded of the importance of commitment to daily physical activity for 30 minutes or more, as able and the need to limit carbohydrate intake to 30 to 60 grams per meal to help with blood sugar control.   The need to take medication as prescribed, test blood sugar as directed, and to call between visits if there is a concern that blood sugar is uncontrolled is also discussed.   Tyrone Hall is reminded of the importance of daily foot exam, annual eye examination, and good blood sugar, blood pressure and cholesterol control.  Diabetic Labs Latest Ref Rng 11/05/2014 09/25/2014 03/17/2014 07/23/2013 04/22/2013  HbA1c <5.7 % - 7.3(H) 6.0(H) 7.3(H) -  Microalbumin <2.0 mg/dL 1.6 - - - 2.54(H)  Micro/Creat Ratio 0.0 - 30.0 mg/g 6.3 - - - 7.1  Chol 0 - 200 mg/dL - 243(H) 151 - -  HDL >=40 mg/dL - 35(L) 35(L) - -  Calc LDL 0 - 99 mg/dL - 173(H) 95 - -  Triglycerides <150 mg/dL - 173(H) 107 - -  Creatinine 0.50 - 1.35 mg/dL - 1.42(H) 1.36(H) 1.34 -   BP/Weight 11/05/2014 10/07/2014 06/12/2014 03/17/2014 12/24/2013 10/29/2013 3/47/4259  Systolic BP 563 875 643 329 518 841 660  Diastolic BP 82 76 75 86 80 87 98  Wt. (Lbs) 203 206 210.8 193 208 200 220  BMI 31.79 32.26 33.01 29.35 31.63 30.42 33.46   Foot/eye exam completion dates Latest Ref Rng 10/07/2014 07/23/2013  Eye Exam No Retinopathy - -  Foot Form Completion - Done Done

## 2014-11-06 NOTE — Assessment & Plan Note (Signed)
B12 administered at visit IM

## 2014-11-06 NOTE — Assessment & Plan Note (Signed)
Sub optimal control, however , no change in management at this time, will re evaluate in 8 to 12 weeks

## 2014-11-06 NOTE — Assessment & Plan Note (Addendum)
Will need to review need to see urology and arrange appt if he has not been for over 1 year , which is likely as he has been missing appts recently Last visit in 05/2013 reviewed, no f/u recommended by urology

## 2014-11-06 NOTE — Assessment & Plan Note (Signed)
Chronic and unchanged, poor candidiate for anticoagulation based on fall risk, he is rate controlled on current medication

## 2014-11-06 NOTE — Assessment & Plan Note (Signed)
Overall slow progression of disease noted, does not have behavioral problems, no change in medication

## 2014-11-06 NOTE — Assessment & Plan Note (Signed)
In light of recent stroke, though minimally hemorrhagic  Will refer to CVTS for re evaluation  No significant  plaque based on recent carotid doppler at [facility]

## 2014-11-06 NOTE — Assessment & Plan Note (Signed)
New incontinence of stool and urine , will request incontinence supplies

## 2014-11-06 NOTE — Assessment & Plan Note (Signed)
Acute left caudate infarct noted during recent hospitalization at [facility], MRI study done on 10/26/2014. Pt presented confused and weak

## 2014-11-08 DIAGNOSIS — Z23 Encounter for immunization: Secondary | ICD-10-CM | POA: Insufficient documentation

## 2014-11-08 NOTE — Progress Notes (Signed)
Tyrone Hall     MRN: [id number]      DOB: [birth year]   HPI Tyrone Hall is here for follow up and re-evaluation of chronic medical conditions, medication management and review of any available recent lab and radiology data.  Preventive health is updated, specifically  Immunization.  The PT denies any adverse reactions to current medications since the last visit. Apparently has not been compliant with medications , and unfortunately no one accompanies him to this visit  There are no specific complaints   ROS Denies recent fever or chills. Denies sinus pressure, nasal congestion, ear pain or sore throat. Denies chest congestion, productive cough or wheezing. Denies chest pains, palpitations and leg swelling Denies abdominal pain, nausea, vomiting,diarrhea or constipation.   Denies dysuria, frequency, hesitancy or incontinence. Chronic joint pain, swelling and limitation in mobility. Denies headaches, seizures, numbness, or tingling. Denies depression, anxiety or insomnia. Denies skin break down or rash.   PE  BP 146/76 mmHg  Pulse 82  Resp 16  Ht 5\' 7"  (1.702 m)  Wt 206 lb (93.441 kg)  BMI 32.26 kg/m2  SpO2 99%  Patient alert and oriented and in no cardiopulmonary distress.  HEENT: No facial asymmetry, EOMI,   oropharynx pink and moist.  Neck supple no JVD, no mass.  Chest: Clear to auscultation bilaterally.  CVS: S1, S2 no murmurs, no S3.Irregular   rate.  ABD: Soft non tender.   Ext: No edema  MS: decreased  ROM spine, shoulders, hips and knees.  Skin: Intact, no ulcerations or rash noted.  Psych: Good eye contact, normal affect. Memory loss not anxious or depressed appearing.  CNS: CN 2-12 intact, power,  normal throughout.no focal deficits noted.   Assessment & Plan   Need for vaccination with 13-polyvalent pneumococcal conjugate vaccine After obtaining informed consent, the vaccine is  administered by LPN.   Diabetes mellitus with kidney  disease Controlled, no change in medication Tyrone Hall is reminded of the importance of commitment to daily physical activity for 30 minutes or more, as able and the need to limit carbohydrate intake to 30 to 60 grams per meal to help with blood sugar control.   The need to take medication as prescribed, test blood sugar as directed, and to call between visits if there is a concern that blood sugar is uncontrolled is also discussed.   Tyrone Hall is reminded of the importance of daily foot exam, annual eye examination, and good blood sugar, blood pressure and cholesterol control.  Diabetic Labs Latest Ref Rng 11/05/2014 09/25/2014 03/17/2014 07/23/2013 04/22/2013  HbA1c <5.7 % - 7.3(H) 6.0(H) 7.3(H) -  Microalbumin <2.0 mg/dL 1.6 - - - 2.54(H)  Micro/Creat Ratio 0.0 - 30.0 mg/g 6.3 - - - 7.1  Chol 0 - 200 mg/dL - 243(H) 151 - -  HDL >=40 mg/dL - 35(L) 35(L) - -  Calc LDL 0 - 99 mg/dL - 173(H) 95 - -  Triglycerides <150 mg/dL - 173(H) 107 - -  Creatinine 0.50 - 1.35 mg/dL - 1.42(H) 1.36(H) 1.34 -   BP/Weight 11/05/2014 10/07/2014 06/12/2014 03/17/2014 12/24/2013 10/29/2013 10/27/4578  Systolic BP 998 338 250 539 767 341 937  Diastolic BP 82 76 75 86 80 87 98  Wt. (Lbs) 203 206 210.8 193 208 200 220  BMI 31.79 32.26 33.01 29.35 31.63 30.42 33.46   Foot/eye exam completion dates Latest Ref Rng 10/07/2014 07/23/2013  Eye Exam No Retinopathy - -  Foot Form Completion - Done Done  Hyperlipidemia LDL goal <100 Uncontrolled, needs to resume medication, will ned to contact one of his children so they will again re engage, pt has dementia and is incapable of caring for himself independently  [facility], UNCOMPLICATED Needs more involvement and care byhi children, missed 3 month f/u appt, labs and blood pressure suggest medication non compliance, nurse  will contact one of his children  Onychomycosis Severe bilateral involving all toes. No treatment at [facility] time, needs diabetic foot care  Need  for vaccination with 13-polyvalent pneumococcal conjugate vaccine After obtaining informed consent, the vaccine is  administered by LPN.   B12 deficiency Parenteral injection offered at visit but patient refused  Atrial fibrillation Rate controlled, not a candidate for chronic anticoagulation due to high fall risk

## 2014-11-08 NOTE — Assessment & Plan Note (Signed)
Uncontrolled, needs to resume medication, will ned to contact one of his children so they will again re engage, pt has dementia and is incapable of caring for himself independently

## 2014-11-08 NOTE — Assessment & Plan Note (Signed)
After obtaining informed consent, the vaccine is  administered by LPN.  

## 2014-11-08 NOTE — Assessment & Plan Note (Signed)
Severe bilateral involving all toes. No treatment at [facility] time, needs diabetic foot care

## 2014-11-08 NOTE — Assessment & Plan Note (Signed)
Rate controlled, not a candidate for chronic anticoagulation due to high fall risk

## 2014-11-08 NOTE — Assessment & Plan Note (Signed)
Controlled, no change in medication Tyrone Hall is reminded of the importance of commitment to daily physical activity for 30 minutes or more, as able and the need to limit carbohydrate intake to 30 to 60 grams per meal to help with blood sugar control.   The need to take medication as prescribed, test blood sugar as directed, and to call between visits if there is a concern that blood sugar is uncontrolled is also discussed.   Tyrone Hall is reminded of the importance of daily foot exam, annual eye examination, and good blood sugar, blood pressure and cholesterol control.  Diabetic Labs Latest Ref Rng 11/05/2014 09/25/2014 03/17/2014 07/23/2013 04/22/2013  HbA1c <5.7 % - 7.3(H) 6.0(H) 7.3(H) -  Microalbumin <2.0 mg/dL 1.6 - - - 2.54(H)  Micro/Creat Ratio 0.0 - 30.0 mg/g 6.3 - - - 7.1  Chol 0 - 200 mg/dL - 243(H) 151 - -  HDL >=40 mg/dL - 35(L) 35(L) - -  Calc LDL 0 - 99 mg/dL - 173(H) 95 - -  Triglycerides <150 mg/dL - 173(H) 107 - -  Creatinine 0.50 - 1.35 mg/dL - 1.42(H) 1.36(H) 1.34 -   BP/Weight 11/05/2014 10/07/2014 06/12/2014 03/17/2014 12/24/2013 10/29/2013 9/76/7341  Systolic BP 937 902 409 735 329 924 268  Diastolic BP 82 76 75 86 80 87 98  Wt. (Lbs) 203 206 210.8 193 208 200 220  BMI 31.79 32.26 33.01 29.35 31.63 30.42 33.46   Foot/eye exam completion dates Latest Ref Rng 10/07/2014 07/23/2013  Eye Exam No Retinopathy - -  Foot Form Completion - Done Done

## 2014-11-08 NOTE — Assessment & Plan Note (Signed)
Parenteral injection offered at visit but patient refused

## 2014-11-08 NOTE — Assessment & Plan Note (Signed)
Needs more involvement and care byhi children, missed 3 month f/u appt, labs and blood pressure suggest medication non compliance, nurse  will contact one of his children

## 2014-11-10 DIAGNOSIS — E119 Type 2 diabetes mellitus without complications: Secondary | ICD-10-CM | POA: Diagnosis not present

## 2014-11-10 DIAGNOSIS — I69398 Other sequelae of cerebral infarction: Secondary | ICD-10-CM | POA: Diagnosis not present

## 2014-11-10 DIAGNOSIS — I10 Essential (primary) hypertension: Secondary | ICD-10-CM | POA: Diagnosis not present

## 2014-11-10 DIAGNOSIS — I4891 Unspecified atrial fibrillation: Secondary | ICD-10-CM | POA: Diagnosis not present

## 2014-11-10 DIAGNOSIS — R531 Weakness: Secondary | ICD-10-CM | POA: Diagnosis not present

## 2014-11-10 DIAGNOSIS — F0390 Unspecified dementia without behavioral disturbance: Secondary | ICD-10-CM | POA: Diagnosis not present

## 2014-11-12 DIAGNOSIS — E119 Type 2 diabetes mellitus without complications: Secondary | ICD-10-CM | POA: Diagnosis not present

## 2014-11-12 DIAGNOSIS — R531 Weakness: Secondary | ICD-10-CM | POA: Diagnosis not present

## 2014-11-12 DIAGNOSIS — I69398 Other sequelae of cerebral infarction: Secondary | ICD-10-CM | POA: Diagnosis not present

## 2014-11-12 DIAGNOSIS — F0390 Unspecified dementia without behavioral disturbance: Secondary | ICD-10-CM | POA: Diagnosis not present

## 2014-11-12 DIAGNOSIS — I4891 Unspecified atrial fibrillation: Secondary | ICD-10-CM | POA: Diagnosis not present

## 2014-11-12 DIAGNOSIS — I10 Essential (primary) hypertension: Secondary | ICD-10-CM | POA: Diagnosis not present

## 2014-11-16 DIAGNOSIS — R531 Weakness: Secondary | ICD-10-CM | POA: Diagnosis not present

## 2014-11-16 DIAGNOSIS — I10 Essential (primary) hypertension: Secondary | ICD-10-CM | POA: Diagnosis not present

## 2014-11-16 DIAGNOSIS — I4891 Unspecified atrial fibrillation: Secondary | ICD-10-CM | POA: Diagnosis not present

## 2014-11-16 DIAGNOSIS — I69398 Other sequelae of cerebral infarction: Secondary | ICD-10-CM | POA: Diagnosis not present

## 2014-11-16 DIAGNOSIS — F0390 Unspecified dementia without behavioral disturbance: Secondary | ICD-10-CM | POA: Diagnosis not present

## 2014-11-16 DIAGNOSIS — E119 Type 2 diabetes mellitus without complications: Secondary | ICD-10-CM | POA: Diagnosis not present

## 2014-11-19 DIAGNOSIS — E119 Type 2 diabetes mellitus without complications: Secondary | ICD-10-CM | POA: Diagnosis not present

## 2014-11-19 DIAGNOSIS — I69398 Other sequelae of cerebral infarction: Secondary | ICD-10-CM | POA: Diagnosis not present

## 2014-11-19 DIAGNOSIS — R531 Weakness: Secondary | ICD-10-CM | POA: Diagnosis not present

## 2014-11-19 DIAGNOSIS — I10 Essential (primary) hypertension: Secondary | ICD-10-CM | POA: Diagnosis not present

## 2014-11-19 DIAGNOSIS — I4891 Unspecified atrial fibrillation: Secondary | ICD-10-CM | POA: Diagnosis not present

## 2014-11-19 DIAGNOSIS — F0390 Unspecified dementia without behavioral disturbance: Secondary | ICD-10-CM | POA: Diagnosis not present

## 2014-11-23 DIAGNOSIS — I69398 Other sequelae of cerebral infarction: Secondary | ICD-10-CM | POA: Diagnosis not present

## 2014-11-23 DIAGNOSIS — R531 Weakness: Secondary | ICD-10-CM | POA: Diagnosis not present

## 2014-11-23 DIAGNOSIS — F0390 Unspecified dementia without behavioral disturbance: Secondary | ICD-10-CM | POA: Diagnosis not present

## 2014-11-23 DIAGNOSIS — I10 Essential (primary) hypertension: Secondary | ICD-10-CM | POA: Diagnosis not present

## 2014-11-23 DIAGNOSIS — E119 Type 2 diabetes mellitus without complications: Secondary | ICD-10-CM | POA: Diagnosis not present

## 2014-11-23 DIAGNOSIS — I4891 Unspecified atrial fibrillation: Secondary | ICD-10-CM | POA: Diagnosis not present

## 2014-11-24 DIAGNOSIS — I4891 Unspecified atrial fibrillation: Secondary | ICD-10-CM | POA: Diagnosis not present

## 2014-11-24 DIAGNOSIS — E119 Type 2 diabetes mellitus without complications: Secondary | ICD-10-CM | POA: Diagnosis not present

## 2014-11-24 DIAGNOSIS — F0390 Unspecified dementia without behavioral disturbance: Secondary | ICD-10-CM | POA: Diagnosis not present

## 2014-11-24 DIAGNOSIS — I69398 Other sequelae of cerebral infarction: Secondary | ICD-10-CM | POA: Diagnosis not present

## 2014-11-24 DIAGNOSIS — I10 Essential (primary) hypertension: Secondary | ICD-10-CM | POA: Diagnosis not present

## 2014-11-24 DIAGNOSIS — R531 Weakness: Secondary | ICD-10-CM | POA: Diagnosis not present

## 2014-12-01 DIAGNOSIS — I69398 Other sequelae of cerebral infarction: Secondary | ICD-10-CM | POA: Diagnosis not present

## 2014-12-01 DIAGNOSIS — E119 Type 2 diabetes mellitus without complications: Secondary | ICD-10-CM | POA: Diagnosis not present

## 2014-12-01 DIAGNOSIS — F0390 Unspecified dementia without behavioral disturbance: Secondary | ICD-10-CM | POA: Diagnosis not present

## 2014-12-01 DIAGNOSIS — I4891 Unspecified atrial fibrillation: Secondary | ICD-10-CM | POA: Diagnosis not present

## 2014-12-01 DIAGNOSIS — I10 Essential (primary) hypertension: Secondary | ICD-10-CM | POA: Diagnosis not present

## 2014-12-01 DIAGNOSIS — R531 Weakness: Secondary | ICD-10-CM | POA: Diagnosis not present

## 2014-12-08 DIAGNOSIS — I4891 Unspecified atrial fibrillation: Secondary | ICD-10-CM | POA: Diagnosis not present

## 2014-12-08 DIAGNOSIS — R531 Weakness: Secondary | ICD-10-CM | POA: Diagnosis not present

## 2014-12-08 DIAGNOSIS — I69398 Other sequelae of cerebral infarction: Secondary | ICD-10-CM | POA: Diagnosis not present

## 2014-12-08 DIAGNOSIS — I10 Essential (primary) hypertension: Secondary | ICD-10-CM | POA: Diagnosis not present

## 2014-12-08 DIAGNOSIS — E119 Type 2 diabetes mellitus without complications: Secondary | ICD-10-CM | POA: Diagnosis not present

## 2014-12-08 DIAGNOSIS — F0390 Unspecified dementia without behavioral disturbance: Secondary | ICD-10-CM | POA: Diagnosis not present

## 2014-12-10 ENCOUNTER — Encounter: Payer: Self-pay | Admitting: Vascular Surgery

## 2014-12-11 ENCOUNTER — Ambulatory Visit: Payer: Medicare Other | Admitting: Vascular Surgery

## 2014-12-15 DIAGNOSIS — R531 Weakness: Secondary | ICD-10-CM | POA: Diagnosis not present

## 2014-12-15 DIAGNOSIS — I10 Essential (primary) hypertension: Secondary | ICD-10-CM | POA: Diagnosis not present

## 2014-12-15 DIAGNOSIS — I4891 Unspecified atrial fibrillation: Secondary | ICD-10-CM | POA: Diagnosis not present

## 2014-12-15 DIAGNOSIS — F0390 Unspecified dementia without behavioral disturbance: Secondary | ICD-10-CM | POA: Diagnosis not present

## 2014-12-15 DIAGNOSIS — E119 Type 2 diabetes mellitus without complications: Secondary | ICD-10-CM | POA: Diagnosis not present

## 2014-12-15 DIAGNOSIS — I69398 Other sequelae of cerebral infarction: Secondary | ICD-10-CM | POA: Diagnosis not present

## 2014-12-22 ENCOUNTER — Telehealth: Payer: Self-pay | Admitting: *Deleted

## 2014-12-22 NOTE — Telephone Encounter (Signed)
Penda

## 2014-12-23 DIAGNOSIS — E119 Type 2 diabetes mellitus without complications: Secondary | ICD-10-CM | POA: Diagnosis not present

## 2014-12-23 DIAGNOSIS — R531 Weakness: Secondary | ICD-10-CM | POA: Diagnosis not present

## 2014-12-23 DIAGNOSIS — I69398 Other sequelae of cerebral infarction: Secondary | ICD-10-CM | POA: Diagnosis not present

## 2014-12-23 DIAGNOSIS — I4891 Unspecified atrial fibrillation: Secondary | ICD-10-CM | POA: Diagnosis not present

## 2014-12-23 DIAGNOSIS — I10 Essential (primary) hypertension: Secondary | ICD-10-CM | POA: Diagnosis not present

## 2014-12-23 DIAGNOSIS — F0390 Unspecified dementia without behavioral disturbance: Secondary | ICD-10-CM | POA: Diagnosis not present

## 2014-12-24 ENCOUNTER — Ambulatory Visit (INDEPENDENT_AMBULATORY_CARE_PROVIDER_SITE_OTHER): Payer: Medicare Other

## 2014-12-24 VITALS — Wt 198.1 lb

## 2014-12-24 DIAGNOSIS — E538 Deficiency of other specified B group vitamins: Secondary | ICD-10-CM

## 2014-12-24 MED ORDER — CYANOCOBALAMIN 1000 MCG/ML IJ SOLN
1000.0000 ug | Freq: Once | INTRAMUSCULAR | Status: AC
  Administered 2014-12-24: 1000 ug via INTRAMUSCULAR

## 2014-12-24 NOTE — Progress Notes (Signed)
Patient in for monthly B12 booster.   Injection given IM as ordered.  Patient to return for office visit this month (september) and will have flu shot then.

## 2014-12-28 DIAGNOSIS — F0390 Unspecified dementia without behavioral disturbance: Secondary | ICD-10-CM | POA: Diagnosis not present

## 2014-12-28 DIAGNOSIS — I69398 Other sequelae of cerebral infarction: Secondary | ICD-10-CM | POA: Diagnosis not present

## 2014-12-28 DIAGNOSIS — R531 Weakness: Secondary | ICD-10-CM | POA: Diagnosis not present

## 2014-12-28 DIAGNOSIS — I10 Essential (primary) hypertension: Secondary | ICD-10-CM | POA: Diagnosis not present

## 2014-12-28 DIAGNOSIS — E119 Type 2 diabetes mellitus without complications: Secondary | ICD-10-CM | POA: Diagnosis not present

## 2014-12-28 DIAGNOSIS — I4891 Unspecified atrial fibrillation: Secondary | ICD-10-CM | POA: Diagnosis not present

## 2015-01-04 ENCOUNTER — Encounter: Payer: Self-pay | Admitting: Vascular Surgery

## 2015-01-06 ENCOUNTER — Ambulatory Visit: Payer: Medicare Other | Admitting: Family

## 2015-01-12 ENCOUNTER — Ambulatory Visit (INDEPENDENT_AMBULATORY_CARE_PROVIDER_SITE_OTHER): Payer: Medicare Other | Admitting: Family Medicine

## 2015-01-12 ENCOUNTER — Encounter: Payer: Self-pay | Admitting: Family Medicine

## 2015-01-12 ENCOUNTER — Telehealth: Payer: Self-pay | Admitting: *Deleted

## 2015-01-12 VITALS — BP 128/80 | HR 83 | Resp 16 | Ht 67.0 in | Wt 196.0 lb

## 2015-01-12 DIAGNOSIS — Z23 Encounter for immunization: Secondary | ICD-10-CM

## 2015-01-12 DIAGNOSIS — F0390 Unspecified dementia without behavioral disturbance: Secondary | ICD-10-CM | POA: Diagnosis not present

## 2015-01-12 DIAGNOSIS — E785 Hyperlipidemia, unspecified: Secondary | ICD-10-CM

## 2015-01-12 DIAGNOSIS — E1121 Type 2 diabetes mellitus with diabetic nephropathy: Secondary | ICD-10-CM | POA: Diagnosis not present

## 2015-01-12 DIAGNOSIS — I10 Essential (primary) hypertension: Secondary | ICD-10-CM | POA: Diagnosis not present

## 2015-01-12 DIAGNOSIS — I6523 Occlusion and stenosis of bilateral carotid arteries: Secondary | ICD-10-CM

## 2015-01-12 DIAGNOSIS — I482 Chronic atrial fibrillation, unspecified: Secondary | ICD-10-CM

## 2015-01-12 NOTE — Telephone Encounter (Signed)
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

## 2015-01-12 NOTE — Progress Notes (Signed)
Subjective:    Patient ID: Tyrone Hall, male    DOB: 11/02/1930, 81 y.o.   MRN: [id number]  HPI   Tyrone Hall     MRN: [id number]      DOB: 06/29/1930   HPI Tyrone Hall is here for follow up and re-evaluation of chronic medical conditions, medication management and review of any available recent lab and radiology data.  Preventive health is updated, specifically  Cancer screening and Immunization.   Questions or concerns regarding consultations or procedures which the PT has had in the interim are  addressed. The PT denies any adverse reactions to current medications since the last visit.  Daughter requests referral for tooenil care, also PCS services to help with daily baths. Currently has someone come in for 5 hours each day who provides his meals, takes him around and sits with him. ROS History sent from his daughter via caregiver, pt incapable of reliable history due to severe dementia Denies recent fever or chills. Denies sinus pressure, nasal congestion, ear pain or sore throat. Denies chest congestion, productive cough or wheezing. Denies chest pains, palpitations and leg swelling Denies abdominal pain, nausea, vomiting,diarrhea or constipation.   Denies dysuria, frequency, hesitancy or incontinence. Denies joint pain, swelling and limitation in mobility. Denies headaches, seizures, numbness, or tingling. Denies depression, anxiety or insomnia. Denies skin break down or rash.   PE  BP 128/80 mmHg  Pulse 83  Resp 16  Ht 5\' 7"  (1.702 m)  Wt 196 lb (88.905 kg)  BMI 30.69 kg/m2  SpO2 97%  Pt alert and disoriented and in no cardiopulmonary distress.  HEENT: No facial asymmetry, EOMI,   oropharynx pink and moist.  Neck supple no JVD, no mass.  Chest: Clear to auscultation bilaterally.  CVS: S1, S2 no murmurs, no S3.Regular rate.  ABD: Soft non tender.   Ext: No edema  MS: Adequate ROM spine, shoulders, hips and knees.  Skin: Intact, no ulcerations or rash  noted.  Psych: Good eye contact, normal affect. Memory loss, severe, does not know year or season not anxious or depressed appearing.  CNS: CN 2-12 intact, power,  normal throughout.no focal deficits noted.   Assessment & Plan   Essential hypertension, benign Controlled, no change in medication   Diabetes mellitus with kidney disease Updated lab needed at/ before next visit. Tyrone Hall is reminded of the importance of commitment to daily physical activity for 30 minutes or more, as able and the need to limit carbohydrate intake to 30 to 60 grams per meal to help with blood sugar control.   The need to take medication as prescribed, test blood sugar as directed, and to call between visits if there is a concern that blood sugar is uncontrolled is also discussed.   Tyrone Hall is reminded of the importance of daily foot exam, annual eye examination, and good blood sugar, blood pressure and cholesterol control.  Diabetic Labs Latest Ref Rng 11/05/2014 09/25/2014 03/17/2014 07/23/2013 04/22/2013  HbA1c <5.7 % - 7.3(H) 6.0(H) 7.3(H) -  Microalbumin <2.0 mg/dL 1.6 - - - 2.54(H)  Micro/Creat Ratio 0.0 - 30.0 mg/g 6.3 - - - 7.1  Chol 0 - 200 mg/dL - 243(H) 151 - -  HDL >=40 mg/dL - 35(L) 35(L) - -  Calc LDL 0 - 99 mg/dL - 173(H) 95 - -  Triglycerides <150 mg/dL - 173(H) 107 - -  Creatinine 0.50 - 1.35 mg/dL - 1.42(H) 1.36(H) 1.34 -   BP/Weight 01/12/2015 12/24/2014 11/05/2014 10/07/2014 06/12/2014  24/07/100 10/16/5364  Systolic BP 440 - 347 425 956 387 564  Diastolic BP 80 - 82 76 75 86 80  Wt. (Lbs) 196 198.08 203 206 210.8 193 208  BMI 30.69 31.02 31.79 32.26 33.01 29.35 31.63   Foot/eye exam completion dates Latest Ref Rng 10/07/2014 07/23/2013  Eye Exam No Retinopathy - -  Foot Form Completion - Done Done         Hyperlipidemia LDL goal <100 Uncontrolled Hyperlipidemia:Low fat diet discussed and encouraged.   Lipid Panel  Lab Results  Component Value Date   CHOL 243* 09/25/2014   HDL  35* 09/25/2014   LDLCALC 173* 09/25/2014   TRIG 173* 09/25/2014   CHOLHDL 6.9 09/25/2014      Updated lab needed at/ before next visit.   SENILE DEMENTIA, UNCOMPLICATED Deterioration in function obvious, however no behavioral disturbance noted or reported currently Will likely qualify for assistance for PCS services through count , due to his severe dementia, message to daughter to pursue this  Atrial fibrillation Rate ciontrolled on current regime  Need for prophylactic vaccination and inoculation against influenza After obtaining informed consent, the vaccine is  administered by LPN.       Review of Systems     Objective:   Physical Exam        Assessment & Plan:

## 2015-01-12 NOTE — Assessment & Plan Note (Signed)
Deterioration in function obvious, however no behavioral disturbance noted or reported currently Will likely qualify for assistance for PCS services through count , due to his severe dementia, message to daughter to pursue this

## 2015-01-12 NOTE — Assessment & Plan Note (Signed)
Updated lab needed at/ before next visit. Tyrone Hall is reminded of the importance of commitment to daily physical activity for 30 minutes or more, as able and the need to limit carbohydrate intake to 30 to 60 grams per meal to help with blood sugar control.   The need to take medication as prescribed, test blood sugar as directed, and to call between visits if there is a concern that blood sugar is uncontrolled is also discussed.   Tyrone Hall is reminded of the importance of daily foot exam, annual eye examination, and good blood sugar, blood pressure and cholesterol control.  Diabetic Labs Latest Ref Rng 11/05/2014 09/25/2014 03/17/2014 07/23/2013 04/22/2013  HbA1c <5.7 % - 7.3(H) 6.0(H) 7.3(H) -  Microalbumin <2.0 mg/dL 1.6 - - - 2.54(H)  Micro/Creat Ratio 0.0 - 30.0 mg/g 6.3 - - - 7.1  Chol 0 - 200 mg/dL - 243(H) 151 - -  HDL >=40 mg/dL - 35(L) 35(L) - -  Calc LDL 0 - 99 mg/dL - 173(H) 95 - -  Triglycerides <150 mg/dL - 173(H) 107 - -  Creatinine 0.50 - 1.35 mg/dL - 1.42(H) 1.36(H) 1.34 -   BP/Weight 01/12/2015 12/24/2014 11/05/2014 10/07/2014 06/12/2014 81/07/4816 08/21/3147  Systolic BP 702 - 637 858 850 277 412  Diastolic BP 80 - 82 76 75 86 80  Wt. (Lbs) 196 198.08 203 206 210.8 193 208  BMI 30.69 31.02 31.79 32.26 33.01 29.35 31.63   Foot/eye exam completion dates Latest Ref Rng 10/07/2014 07/23/2013  Eye Exam No Retinopathy - -  Foot Form Completion - Done Done

## 2015-01-12 NOTE — Assessment & Plan Note (Signed)
Rate ciontrolled on current regime

## 2015-01-12 NOTE — Assessment & Plan Note (Signed)
Controlled, no change in medication  

## 2015-01-12 NOTE — Patient Instructions (Signed)
Annual wellness  in January, call if you need me before  You are referred for diabetic foot care, which you absolutely need  Flu vaccine today  Fasting labs end ?oct or early Novemeber  Excellent blood pressure   help with baths and personal care  this readily at no cost, alternately with dementia you can apply with aging and disability for help , you  Get on a list. Call and ask nurse for any additional help if needed  Thanks for choosing [facility], we consider it a privelige to serve you.

## 2015-01-12 NOTE — Telephone Encounter (Signed)
Noted  

## 2015-01-12 NOTE — Assessment & Plan Note (Signed)
Uncontrolled Hyperlipidemia:Low fat diet discussed and encouraged.   Lipid Panel  Lab Results  Component Value Date   CHOL 243* 09/25/2014   HDL 35* 09/25/2014   LDLCALC 173* 09/25/2014   TRIG 173* 09/25/2014   CHOLHDL 6.9 09/25/2014      Updated lab needed at/ before next visit.

## 2015-01-12 NOTE — Assessment & Plan Note (Signed)
After obtaining informed consent, the vaccine is  administered by LPN.  

## 2015-01-13 ENCOUNTER — Encounter: Payer: Self-pay | Admitting: Family

## 2015-01-15 ENCOUNTER — Ambulatory Visit: Payer: Medicare Other | Admitting: Family

## 2015-01-25 ENCOUNTER — Ambulatory Visit: Payer: Medicare Other

## 2015-02-02 ENCOUNTER — Encounter: Payer: Self-pay | Admitting: Family

## 2015-02-02 ENCOUNTER — Ambulatory Visit (INDEPENDENT_AMBULATORY_CARE_PROVIDER_SITE_OTHER): Payer: Medicare Other

## 2015-02-02 DIAGNOSIS — E538 Deficiency of other specified B group vitamins: Secondary | ICD-10-CM | POA: Diagnosis not present

## 2015-02-02 MED ORDER — CYANOCOBALAMIN 1000 MCG/ML IJ SOLN
1000.0000 ug | Freq: Once | INTRAMUSCULAR | Status: AC
  Administered 2015-02-02: 1000 ug via INTRAMUSCULAR

## 2015-02-02 NOTE — Progress Notes (Signed)
Patient in for monthly maintenance B12 injection.  Injection given as ordered.  No voiced complaints.

## 2015-02-05 ENCOUNTER — Ambulatory Visit: Payer: Medicare Other | Admitting: Family

## 2015-02-05 ENCOUNTER — Telehealth: Payer: Self-pay | Admitting: *Deleted

## 2015-02-05 NOTE — Telephone Encounter (Signed)
[facility] called to let Dr. Moshe Cipro know patient NO SHOWED his appt.

## 2015-03-29 ENCOUNTER — Other Ambulatory Visit: Payer: Self-pay | Admitting: Family Medicine

## 2015-04-02 ENCOUNTER — Other Ambulatory Visit: Payer: Self-pay | Admitting: Family Medicine

## 2015-04-13 ENCOUNTER — Telehealth: Payer: Self-pay | Admitting: Family Medicine

## 2015-04-13 NOTE — Telephone Encounter (Signed)
Daughter will call back with information for fax

## 2015-04-13 NOTE — Telephone Encounter (Signed)
PLEASE CALL REGARDING FMLA FORMS

## 2015-05-13 ENCOUNTER — Telehealth: Payer: Self-pay | Admitting: Family Medicine

## 2015-05-13 ENCOUNTER — Other Ambulatory Visit: Payer: Self-pay | Admitting: Family Medicine

## 2015-05-13 NOTE — Telephone Encounter (Signed)
Patient is needing his medication refilled that he takes everyday at 2:00 pm, please advise he has been out for 2 days

## 2015-05-14 ENCOUNTER — Other Ambulatory Visit: Payer: Self-pay

## 2015-05-14 MED ORDER — METFORMIN HCL 500 MG PO TABS: ORAL_TABLET | ORAL | Status: DC

## 2015-05-14 MED ORDER — LOSARTAN POTASSIUM 25 MG PO TABS: 25.0000 mg | ORAL_TABLET | Freq: Every day | ORAL | Status: DC

## 2015-05-14 MED ORDER — DONEPEZIL HCL 10 MG PO TABS: ORAL_TABLET | ORAL | Status: DC

## 2015-05-14 MED ORDER — LOVASTATIN 40 MG PO TABS: ORAL_TABLET | ORAL | Status: DC

## 2015-05-14 NOTE — Telephone Encounter (Signed)
All meds refilled.

## 2015-05-17 ENCOUNTER — Ambulatory Visit (INDEPENDENT_AMBULATORY_CARE_PROVIDER_SITE_OTHER): Payer: Medicare Other | Admitting: Family Medicine

## 2015-05-17 ENCOUNTER — Encounter: Payer: Self-pay | Admitting: Family Medicine

## 2015-05-17 VITALS — BP 122/68 | HR 60 | Resp 18 | Ht 67.0 in | Wt 205.0 lb

## 2015-05-17 DIAGNOSIS — I10 Essential (primary) hypertension: Secondary | ICD-10-CM | POA: Diagnosis not present

## 2015-05-17 DIAGNOSIS — Z0000 Encounter for general adult medical examination without abnormal findings: Secondary | ICD-10-CM

## 2015-05-17 DIAGNOSIS — E785 Hyperlipidemia, unspecified: Secondary | ICD-10-CM

## 2015-05-17 DIAGNOSIS — E1121 Type 2 diabetes mellitus with diabetic nephropathy: Secondary | ICD-10-CM | POA: Diagnosis not present

## 2015-05-17 DIAGNOSIS — D509 Iron deficiency anemia, unspecified: Secondary | ICD-10-CM

## 2015-05-17 DIAGNOSIS — E538 Deficiency of other specified B group vitamins: Secondary | ICD-10-CM | POA: Diagnosis not present

## 2015-05-17 DIAGNOSIS — E1129 Type 2 diabetes mellitus with other diabetic kidney complication: Secondary | ICD-10-CM | POA: Diagnosis not present

## 2015-05-17 LAB — COMPLETE METABOLIC PANEL WITH GFR
ALBUMIN: 3.8 g/dL (ref 3.6–5.1)
ALT: 10 U/L (ref 9–46)
AST: 14 U/L (ref 10–35)
Alkaline Phosphatase: 67 U/L (ref 40–115)
BUN: 7 mg/dL (ref 7–25)
CALCIUM: 9.1 mg/dL (ref 8.6–10.3)
CHLORIDE: 103 mmol/L (ref 98–110)
CO2: 28 mmol/L (ref 20–31)
CREATININE: 1.16 mg/dL — AB (ref 0.70–1.11)
GFR, Est African American: 67 mL/min (ref >=60)
GFR, Est Non African American: 58 mL/min — ABNORMAL LOW (ref >=60)
GLUCOSE: 116 mg/dL — AB (ref 65–99)
Potassium: 4.3 mmol/L (ref 3.5–5.3)
Sodium: 141 mmol/L (ref 135–146)
Total Bilirubin: 1.1 mg/dL (ref 0.2–1.2)
Total Protein: 7.2 g/dL (ref 6.1–8.1)

## 2015-05-17 LAB — LIPID PANEL
CHOL/HDL RATIO: 4.4 ratio (ref <=5.0)
Cholesterol: 189 mg/dL (ref 125–200)
HDL: 43 mg/dL (ref >=40)
LDL CALC: 125 mg/dL (ref <130)
Triglycerides: 107 mg/dL (ref <150)
VLDL: 21 mg/dL (ref <30)

## 2015-05-17 LAB — CBC WITH DIFFERENTIAL/PLATELET
Basophils Absolute: 0 10*3/uL (ref 0.0–0.1)
Basophils Relative: 0 % (ref 0–1)
EOS ABS: 0.2 10*3/uL (ref 0.0–0.7)
Eosinophils Relative: 2 % (ref 0–5)
HEMATOCRIT: 41.1 % (ref 39.0–52.0)
HEMOGLOBIN: 14.5 g/dL (ref 13.0–17.0)
LYMPHS ABS: 3 10*3/uL (ref 0.7–4.0)
LYMPHS PCT: 37 % (ref 12–46)
MCH: 28 pg (ref 26.0–34.0)
MCHC: 35.3 g/dL (ref 30.0–36.0)
MCV: 79.3 fL (ref 78.0–100.0)
MONOS PCT: 9 % (ref 3–12)
MPV: 9.9 fL (ref 8.6–12.4)
Monocytes Absolute: 0.7 10*3/uL (ref 0.1–1.0)
NEUTROS PCT: 52 % (ref 43–77)
Neutro Abs: 4.2 10*3/uL (ref 1.7–7.7)
PLATELETS: 149 10*3/uL — AB (ref 150–400)
RBC: 5.18 MIL/uL (ref 4.22–5.81)
RDW: 15.4 % (ref 11.5–15.5)
WBC: 8 10*3/uL (ref 4.0–10.5)

## 2015-05-17 LAB — HEMOGLOBIN A1C
HEMOGLOBIN A1C: 6.8 % — AB (ref <5.7)
Mean Plasma Glucose: 148 mg/dL — ABNORMAL HIGH (ref <117)

## 2015-05-17 LAB — TSH: TSH: 0.369 u[IU]/mL (ref 0.350–4.500)

## 2015-05-17 MED ORDER — CYANOCOBALAMIN 1000 MCG/ML IJ SOLN
1000.0000 ug | Freq: Once | INTRAMUSCULAR | Status: AC
  Administered 2015-05-17: 1000 ug via INTRAMUSCULAR

## 2015-05-17 NOTE — Patient Instructions (Signed)
F/u in 4 month, call if you need me before  Labs  Today  B12 today  Pls remember  To take night  Pills  Thanks for choosing [facility], we consider it a privelige to serve you.

## 2015-05-17 NOTE — Progress Notes (Signed)
Subjective:    Patient ID: Tyrone Hall, male    DOB: [birth year], 82 y.o.   MRN: [id number]  HPI Preventive Screening-Counseling & Management   Patient present here today for a Medicare annual wellness visit.   Current Problems (verified)   Medications Prior to Visit Allergies (verified)   PAST HISTORY  Family History (updated)  Social History Retired male; divorced with 2 children ( son and daughter)   Risk Factors  Current exercise habits:  Walks daily (does use a cane )   Dietary issues discussed:  Discussed better eating habits as he eats daily at [employer] for most meals    Cardiac risk factors: diabetes, hypertension  Depression Screen  (Note: if answer to either of the following is "Yes", a more complete depression screening is indicated)   Over the past two weeks, have you felt down, depressed or hopeless? No  Over the past two weeks, have you felt little interest or pleasure in doing things? No  Have you lost interest or pleasure in daily life? No  Do you often feel hopeless? No  Do you cry easily over simple problems? No   Activities of Daily Living  In your present state of health, do you have any difficulty performing the following activities?  Driving?: Yes, depends on public transportation and family Managing money?: Handled by children  Feeding yourself?:No Getting from bed to chair?:No Climbing a flight of stairs?: Somewhat due to use of assistive device  Preparing food and eating?:No Bathing or showering?:No Getting dressed?:No Getting to the toilet?:No Using the toilet?:No Moving around from place to place?: Yes, due to use of assistive device   Fall Risk Assessment In the past year have you fallen or had a near fall?:No Are you currently taking any medications that make you dizzy?:No   Hearing Difficulties: No Do you often ask people to speak up or repeat themselves?:yes  intermiittently Do you experience ringing or noises in your  ears?:No Do you have difficulty understanding soft or whispered voices?:No  Cognitive Testing  Alert? Yes Normal Appearance?Yes  Oriented to person? Yes Place? Yes  Time?no  Displays appropriate judgment? Impairment at times with diagnosis of dementia  Can read the correct time from a watch face? yes Are you having problems remembering things? Yes, dementia   Advanced Directives have been discussed with the patient?Yes-and brochure given to family friend that accompanied     List the Names of Other Physician/Practitioners you currently use: careteams updated    Indicate any recent Medical Services you may have received from other than [facility] providers in the past year (date may be approximate).   Assessment:    Annual Wellness Exam   Plan:    During the course of the visit the patient was educated and counseled about appropriate screening and preventive services including:  A healthy diet is rich in fruit, vegetables and whole grains. Poultry fish, nuts and beans are a healthy choice for protein rather then red meat. A low sodium diet and drinking 64 ounces of water daily is generally recommended. Oils and sweet should be limited. Carbohydrates especially for those who are diabetic or overweight, should be limited to 30-45 gram per meal. It is important to eat on a regular schedule, at least 3 times daily. Snacks should be primarily fruits, vegetables or nuts. It is important that you exercise regularly at least 30 minutes 5 times a week. If you develop chest pain, have severe difficulty breathing, or feel very tired, stop  exercising immediately and seek medical attention  Immunization reviewed and updated. Cancer screening reviewed and updated    Patient Instructions (the written plan) was given to the patient.  Medicare Attestation  I have personally reviewed:  The patient's medical and social history  Their use of alcohol, tobacco or illicit drugs  Their current medications and  supplements  The patient's functional ability including ADLs,fall risks, home safety risks, cognitive, and hearing and visual impairment  Diet and physical activities  Evidence for depression or mood disorders  The patient's weight, height, BMI, and visual acuity have been recorded in the chart. I have made referrals, counseling, and provided education to the patient based on review of the above and I have provided the patient with a written personalized care plan for preventive services.      Review of Systems     Objective:   Physical Exam  BP 122/68 mmHg  Pulse 60  Resp 18  Ht 5\' 7"  (1.702 m)  Wt 205 lb (92.987 kg)  BMI 32.10 kg/m2  SpO2 97%       Assessment & Plan:  Medicare annual wellness visit, subsequent Annual exam as documented. Counseling done  re healthy lifestyle involving commitment to 150 minutes exercise per week, heart healthy diet, and attaining healthy weight.The importance of adequate sleep also discussed. Regular seat belt use and home safety, is also discussed.   B12 deficiency 1 cc B12 administered IM after informed consent by LPN

## 2015-05-22 ENCOUNTER — Encounter: Payer: Self-pay | Admitting: Family Medicine

## 2015-05-22 DIAGNOSIS — Z0000 Encounter for general adult medical examination without abnormal findings: Secondary | ICD-10-CM | POA: Insufficient documentation

## 2015-05-22 NOTE — Assessment & Plan Note (Signed)
Annual exam as documented. °Counseling done  re healthy lifestyle involving commitment to 150 minutes exercise per week, heart healthy diet, and attaining healthy weight.The importance of adequate sleep also discussed. °Regular seat belt use and home safety, is also discussed. ° °

## 2015-05-22 NOTE — Assessment & Plan Note (Signed)
1 cc B12 administered IM after informed consent by LPN

## 2015-06-16 ENCOUNTER — Other Ambulatory Visit: Payer: Self-pay | Admitting: Family Medicine

## 2015-06-25 ENCOUNTER — Encounter: Payer: Self-pay | Admitting: *Deleted

## 2015-08-19 ENCOUNTER — Ambulatory Visit (INDEPENDENT_AMBULATORY_CARE_PROVIDER_SITE_OTHER): Payer: Medicare Other

## 2015-08-19 DIAGNOSIS — E538 Deficiency of other specified B group vitamins: Secondary | ICD-10-CM | POA: Diagnosis not present

## 2015-08-19 MED ORDER — CYANOCOBALAMIN 1000 MCG/ML IJ SOLN
1000.0000 ug | Freq: Once | INTRAMUSCULAR | Status: AC
  Administered 2015-08-19: 1000 ug via INTRAMUSCULAR

## 2015-08-19 NOTE — Progress Notes (Signed)
Injection given in left deltoid with no complications. Patient to return in 1 month to get next injection

## 2015-09-06 ENCOUNTER — Ambulatory Visit (INDEPENDENT_AMBULATORY_CARE_PROVIDER_SITE_OTHER): Payer: Medicare Other | Admitting: Family Medicine

## 2015-09-06 ENCOUNTER — Encounter: Payer: Self-pay | Admitting: Family Medicine

## 2015-09-06 VITALS — BP 124/78 | HR 73 | Resp 16 | Ht 67.0 in | Wt 218.0 lb

## 2015-09-06 DIAGNOSIS — I482 Chronic atrial fibrillation, unspecified: Secondary | ICD-10-CM

## 2015-09-06 DIAGNOSIS — F0391 Unspecified dementia with behavioral disturbance: Secondary | ICD-10-CM | POA: Diagnosis not present

## 2015-09-06 DIAGNOSIS — D509 Iron deficiency anemia, unspecified: Secondary | ICD-10-CM

## 2015-09-06 DIAGNOSIS — E1121 Type 2 diabetes mellitus with diabetic nephropathy: Secondary | ICD-10-CM

## 2015-09-06 DIAGNOSIS — I10 Essential (primary) hypertension: Secondary | ICD-10-CM

## 2015-09-06 DIAGNOSIS — F03918 Unspecified dementia, unspecified severity, with other behavioral disturbance: Secondary | ICD-10-CM

## 2015-09-06 DIAGNOSIS — E1129 Type 2 diabetes mellitus with other diabetic kidney complication: Secondary | ICD-10-CM | POA: Diagnosis not present

## 2015-09-06 DIAGNOSIS — R296 Repeated falls: Secondary | ICD-10-CM | POA: Diagnosis not present

## 2015-09-06 DIAGNOSIS — M7989 Other specified soft tissue disorders: Secondary | ICD-10-CM

## 2015-09-06 DIAGNOSIS — Z0289 Encounter for other administrative examinations: Secondary | ICD-10-CM

## 2015-09-06 DIAGNOSIS — R600 Localized edema: Secondary | ICD-10-CM

## 2015-09-06 LAB — COMPLETE METABOLIC PANEL WITH GFR
ALT: 10 U/L (ref 9–46)
AST: 14 U/L (ref 10–35)
Albumin: 3.7 g/dL (ref 3.6–5.1)
Alkaline Phosphatase: 75 U/L (ref 40–115)
BUN: 13 mg/dL (ref 7–25)
CALCIUM: 8.8 mg/dL (ref 8.6–10.3)
CHLORIDE: 100 mmol/L (ref 98–110)
CO2: 27 mmol/L (ref 20–31)
Creat: 1.4 mg/dL — ABNORMAL HIGH (ref 0.70–1.11)
GFR, EST AFRICAN AMERICAN: 54 mL/min — AB (ref >=60)
GFR, EST NON AFRICAN AMERICAN: 46 mL/min — AB (ref >=60)
Glucose, Bld: 116 mg/dL — ABNORMAL HIGH (ref 65–99)
POTASSIUM: 3.7 mmol/L (ref 3.5–5.3)
Sodium: 137 mmol/L (ref 135–146)
Total Bilirubin: 0.6 mg/dL (ref 0.2–1.2)
Total Protein: 7.1 g/dL (ref 6.1–8.1)

## 2015-09-06 MED ORDER — FUROSEMIDE 20 MG PO TABS: 20.0000 mg | ORAL_TABLET | Freq: Every day | ORAL | Status: DC

## 2015-09-06 NOTE — Progress Notes (Signed)
Subjective:    Patient ID: KOH AMOR, male    DOB: Dec 13, 1930, 82 y.o.   MRN: [id number]  HPI   Tyrone Hall     MRN: [id number]      DOB: 1930-09-03   HPI Tyrone Hall is here for follow up and re-evaluation of chronic medical conditions, medication management and review of any available recent lab and radiology data.  Preventive health is updated, specifically   Immunization.   Message sent from his daughter with his caregiver, that she has concerns re increasing inability to care for Tyrone Hall at home due to increased wandering, repeatedly on the street on his own, thumbing rides and he has had several,falls. Also c/o leg swelling  ROS Tyrone Hall incapable of reliable history Per accompanying caregiver Denies recent fever or chills. Denies sinus pressure, nasal congestion, ear pain or sore throat. Denies chest congestion, productive cough or wheezing. Denies chest pains, palpitations c/o  leg swelling Denies , vomiting,diarrhea or constipation.   c/o incontinence. C/o limitation in safe mobility with recurrent falls  Denies depression, anxiety or insomnia. C/o thick scaly skin on feet with thickened long toenails, needs to see podiatry PE  BP 124/78 mmHg  Pulse 73  Resp 16  Ht 5\' 7"  (1.702 m)  Wt 218 lb (98.884 kg)  BMI 34.14 kg/m2  SpO2 98%  Patient alert and  in no cardiopulmonary distress.  HEENT: No facial asymmetry, EOMI,   oropharynx pink and moist.  Neck decreased ROM no JVD, no mass.  Chest: Clear to auscultation bilaterally.  CVS: S1, S2 systolic murmurs, no S3.IrRegular rate.  ABD: Soft non tender.   Ext:one plus edema  MS: decreased  ROM spine, shoulders, hips and knees.  Skin: Intact, no ulcerations or rash noted.  Psych: Good eye contact, normal affect. Memory impaired, not anxious or depressed appearing  CNS: CN 2-12 intact, power,  normal throughout.no focal deficits noted.   Assessment & Plan   Dementia with behavioral disturbance Fam and  caregiver  Report increased difficulty with care, Tyrone Hall wanders, also increased difficulty with personal hygiene, does not want to bathe. Placement in a facility will be approprite, daughter has sent a message witht his question, she needs to check on this and plan to attend next visit, form completion for placement may be completed if she calls in confirming desire to follow through on placement   Diabetes mellitus with kidney disease Controlled, no change in medication Tyrone Hall is reminded of the importance of commitment to daily physical activity for 30 minutes or more, as able and the need to limit carbohydrate intake to 30 to 60 grams per meal to help with blood sugar control.   The need to take medication as prescribed, test blood sugar as directed, and to call between visits if there is a concern that blood sugar is uncontrolled is also discussed.   Tyrone Hall is reminded of the importance of daily foot exam, annual eye examination, and good blood sugar, blood pressure and cholesterol control.  Diabetic Labs Latest Ref Rng 09/06/2015 05/17/2015 11/05/2014 09/25/2014 03/17/2014  HbA1c <5.7 % 7.4(H) 6.8(H) - 7.3(H) 6.0(H)  Microalbumin <2.0 mg/dL - - 1.6 - -  Micro/Creat Ratio 0.0 - 30.0 mg/g - - 6.3 - -  Chol 125 - 200 mg/dL - 189 - 243(H) 151  HDL >=40 mg/dL - 43 - 35(L) 35(L)  Calc LDL <130 mg/dL - 125 - 173(H) 95  Triglycerides <150 mg/dL - 107 - 173(H) 107  Creatinine 0.70 -  1.11 mg/dL 1.40(H) 1.16(H) - 1.42(H) 1.36(H)   BP/Weight 09/06/2015 05/17/2015 01/12/2015 12/24/2014 11/05/2014 10/07/2014 [id number]  Systolic BP A999333 123XX123 0000000 - 123456 123456 0000000  Diastolic BP 78 68 80 - 82 76 75  Wt. (Lbs) 218 205 196 198.08 203 206 210.8  BMI 34.14 32.1 30.69 31.02 31.79 32.26 33.01   Foot/eye exam completion dates Latest Ref Rng 10/07/2014 07/23/2013  Eye Exam No Retinopathy - -  Foot Form Completion - Done Done         Essential hypertension, benign Controlled, no change in medication DASH diet and  commitment to daily physical activity for a minimum of 30 minutes discussed and encouraged, as a part of hypertension management. The importance of attaining a healthy weight is also discussed.  BP/Weight 09/06/2015 05/17/2015 01/12/2015 12/24/2014 11/05/2014 10/07/2014 Q000111Q  Systolic BP A999333 123XX123 0000000 - 123456 123456 0000000  Diastolic BP 78 68 80 - 82 76 75  Wt. (Lbs) 218 205 196 198.08 203 206 210.8  BMI 34.14 32.1 30.69 31.02 31.79 32.26 33.01        Falls frequently Increased frequency of falls in Tyrone Hall with marked cognitive impairment and stenosis, home safety reviewed, needs to be in supervised living situation  Atrial fibrillation Rate controlled on current meds continue same       Review of Systems     Objective:   Physical Exam        Assessment & Plan:

## 2015-09-06 NOTE — Patient Instructions (Addendum)
F/u in 6 weeks call if you need me sooner  Labs today, CBC, cmp and eGGFr,HBa1C,   You are referred to podiatry and for eye exam' 'New medication for swelling lasix take one daily, cut back on salt t  \ No MORE FALLS annd stop going out on your own, not safe!   I think that [facility] is a good option for yopu, daughter to check on this , let us know, and also to plan to  attend next visit with him

## 2015-09-07 ENCOUNTER — Other Ambulatory Visit: Payer: Self-pay

## 2015-09-07 LAB — CBC
HEMATOCRIT: 41.3 % (ref 38.5–50.0)
Hemoglobin: 14.4 g/dL (ref 13.2–17.1)
MCH: 27 pg (ref 27.0–33.0)
MCHC: 34.9 g/dL (ref 32.0–36.0)
MCV: 77.5 fL — AB (ref 80.0–100.0)
MPV: 10.9 fL (ref 7.5–12.5)
PLATELETS: 142 10*3/uL (ref 140–400)
RBC: 5.33 MIL/uL (ref 4.20–5.80)
RDW: 15.9 % — AB (ref 11.0–15.0)
WBC: 8.1 10*3/uL (ref 3.8–10.8)

## 2015-09-07 LAB — HEMOGLOBIN A1C
HEMOGLOBIN A1C: 7.4 % — AB (ref <5.7)
MEAN PLASMA GLUCOSE: 166 mg/dL

## 2015-09-07 MED ORDER — LOVASTATIN 40 MG PO TABS: ORAL_TABLET | ORAL | Status: DC

## 2015-09-07 MED ORDER — LOSARTAN POTASSIUM 25 MG PO TABS: 25.0000 mg | ORAL_TABLET | Freq: Every day | ORAL | Status: DC

## 2015-09-07 MED ORDER — DONEPEZIL HCL 10 MG PO TABS: ORAL_TABLET | ORAL | Status: DC

## 2015-09-07 MED ORDER — DILTIAZEM HCL ER COATED BEADS 180 MG PO CP24: ORAL_CAPSULE | ORAL | Status: DC

## 2015-09-07 MED ORDER — METFORMIN HCL 500 MG PO TABS: ORAL_TABLET | ORAL | Status: DC

## 2015-09-16 ENCOUNTER — Other Ambulatory Visit: Payer: Self-pay | Admitting: Family Medicine

## 2015-09-16 DIAGNOSIS — E1169 Type 2 diabetes mellitus with other specified complication: Secondary | ICD-10-CM

## 2015-09-16 DIAGNOSIS — E669 Obesity, unspecified: Principal | ICD-10-CM

## 2015-09-16 DIAGNOSIS — R600 Localized edema: Secondary | ICD-10-CM | POA: Insufficient documentation

## 2015-09-16 DIAGNOSIS — B351 Tinea unguium: Secondary | ICD-10-CM

## 2015-09-16 DIAGNOSIS — L84 Corns and callosities: Secondary | ICD-10-CM

## 2015-09-16 NOTE — Assessment & Plan Note (Signed)
Controlled, no change in medication [name] is reminded of the importance of commitment to daily physical activity for 30 minutes or more, as able and the need to limit carbohydrate intake to 30 to 60 grams per meal to help with blood sugar control.   The need to take medication as prescribed, test blood sugar as directed, and to call between visits if there is a concern that blood sugar is uncontrolled is also discussed.   Tyrone Hall is reminded of the importance of daily foot exam, annual eye examination, and good blood sugar, blood pressure and cholesterol control.  Diabetic Labs Latest Ref Rng 09/06/2015 05/17/2015 11/05/2014 09/25/2014 03/17/2014  HbA1c <5.7 % 7.4(H) 6.8(H) - 7.3(H) 6.0(H)  Microalbumin <2.0 mg/dL - - 1.6 - -  Micro/Creat Ratio 0.0 - 30.0 mg/g - - 6.3 - -  Chol 125 - 200 mg/dL - 189 - 243(H) 151  HDL >=40 mg/dL - 43 - 35(L) 35(L)  Calc LDL <130 mg/dL - 125 - 173(H) 95  Triglycerides <150 mg/dL - 107 - 173(H) 107  Creatinine 0.70 - 1.11 mg/dL 1.40(H) 1.16(H) - 1.42(H) 1.36(H)   BP/Weight 09/06/2015 05/17/2015 01/12/2015 12/24/2014 11/05/2014 10/07/2014 Q000111Q  Systolic BP A999333 123XX123 0000000 - 123456 123456 0000000  Diastolic BP 78 68 80 - 82 76 75  Wt. (Lbs) 218 205 196 198.08 203 206 210.8  BMI 34.14 32.1 30.69 31.02 31.79 32.26 33.01   Foot/eye exam completion dates Latest Ref Rng 10/07/2014 07/23/2013  Eye Exam No Retinopathy - -  Foot Form Completion - Done Done

## 2015-09-16 NOTE — Assessment & Plan Note (Signed)
Rate controlled on current meds continue same

## 2015-09-16 NOTE — Assessment & Plan Note (Signed)
Controlled, no change in medication DASH diet and commitment to daily physical activity for a minimum of 30 minutes discussed and encouraged, as a part of hypertension management. The importance of attaining a healthy weight is also discussed.  BP/Weight 09/06/2015 05/17/2015 01/12/2015 12/24/2014 11/05/2014 10/07/2014 Q000111Q  Systolic BP A999333 123XX123 0000000 - 123456 123456 0000000  Diastolic BP 78 68 80 - 82 76 75  Wt. (Lbs) 218 205 196 198.08 203 206 210.8  BMI 34.14 32.1 30.69 31.02 31.79 32.26 33.01

## 2015-09-16 NOTE — Assessment & Plan Note (Signed)
Increased frequency of falls in pt with marked cognitive impairment and stenosis, home safety reviewed, needs to be in supervised living situation

## 2015-09-16 NOTE — Assessment & Plan Note (Signed)
Fam and caregiver  Report increased difficulty with care, pt wanders, also increased difficulty with personal hygiene, does not want to bathe. Placement in a facility will be approprite, daughter has sent a message witht his question, she needs to check on this and plan to attend next visit, form completion for placement may be completed if she calls in confirming desire to follow through on placement

## 2015-09-16 NOTE — Assessment & Plan Note (Signed)
Bilateral edema, low sodium , leg elevation, and lasix as needed

## 2015-10-01 DIAGNOSIS — M79671 Pain in right foot: Secondary | ICD-10-CM | POA: Diagnosis not present

## 2015-10-01 DIAGNOSIS — E1151 Type 2 diabetes mellitus with diabetic peripheral angiopathy without gangrene: Secondary | ICD-10-CM | POA: Diagnosis not present

## 2015-10-01 DIAGNOSIS — E1140 Type 2 diabetes mellitus with diabetic neuropathy, unspecified: Secondary | ICD-10-CM | POA: Diagnosis not present

## 2015-10-01 DIAGNOSIS — B351 Tinea unguium: Secondary | ICD-10-CM | POA: Diagnosis not present

## 2015-10-12 ENCOUNTER — Ambulatory Visit (INDEPENDENT_AMBULATORY_CARE_PROVIDER_SITE_OTHER): Payer: Medicare Other

## 2015-10-12 DIAGNOSIS — E538 Deficiency of other specified B group vitamins: Secondary | ICD-10-CM

## 2015-10-12 MED ORDER — CYANOCOBALAMIN 1000 MCG/ML IJ SOLN
1000.0000 ug | Freq: Once | INTRAMUSCULAR | Status: AC
  Administered 2015-10-12: 1000 ug via INTRAMUSCULAR

## 2015-10-12 NOTE — Progress Notes (Signed)
Patient in for b12 injection.   Injection given as ordered.  No voiced complaints.

## 2015-10-27 IMAGING — CR DG CHEST 2V
2 series · 2 of 2 positions shown · non-contrast
Comparison: PA and lateral chest 07/29/2013 and 12/17/2008.

CLINICAL DATA: History of pneumonia.

EXAM:
CHEST  2 VIEW

[view not recorded (1 of 2)]
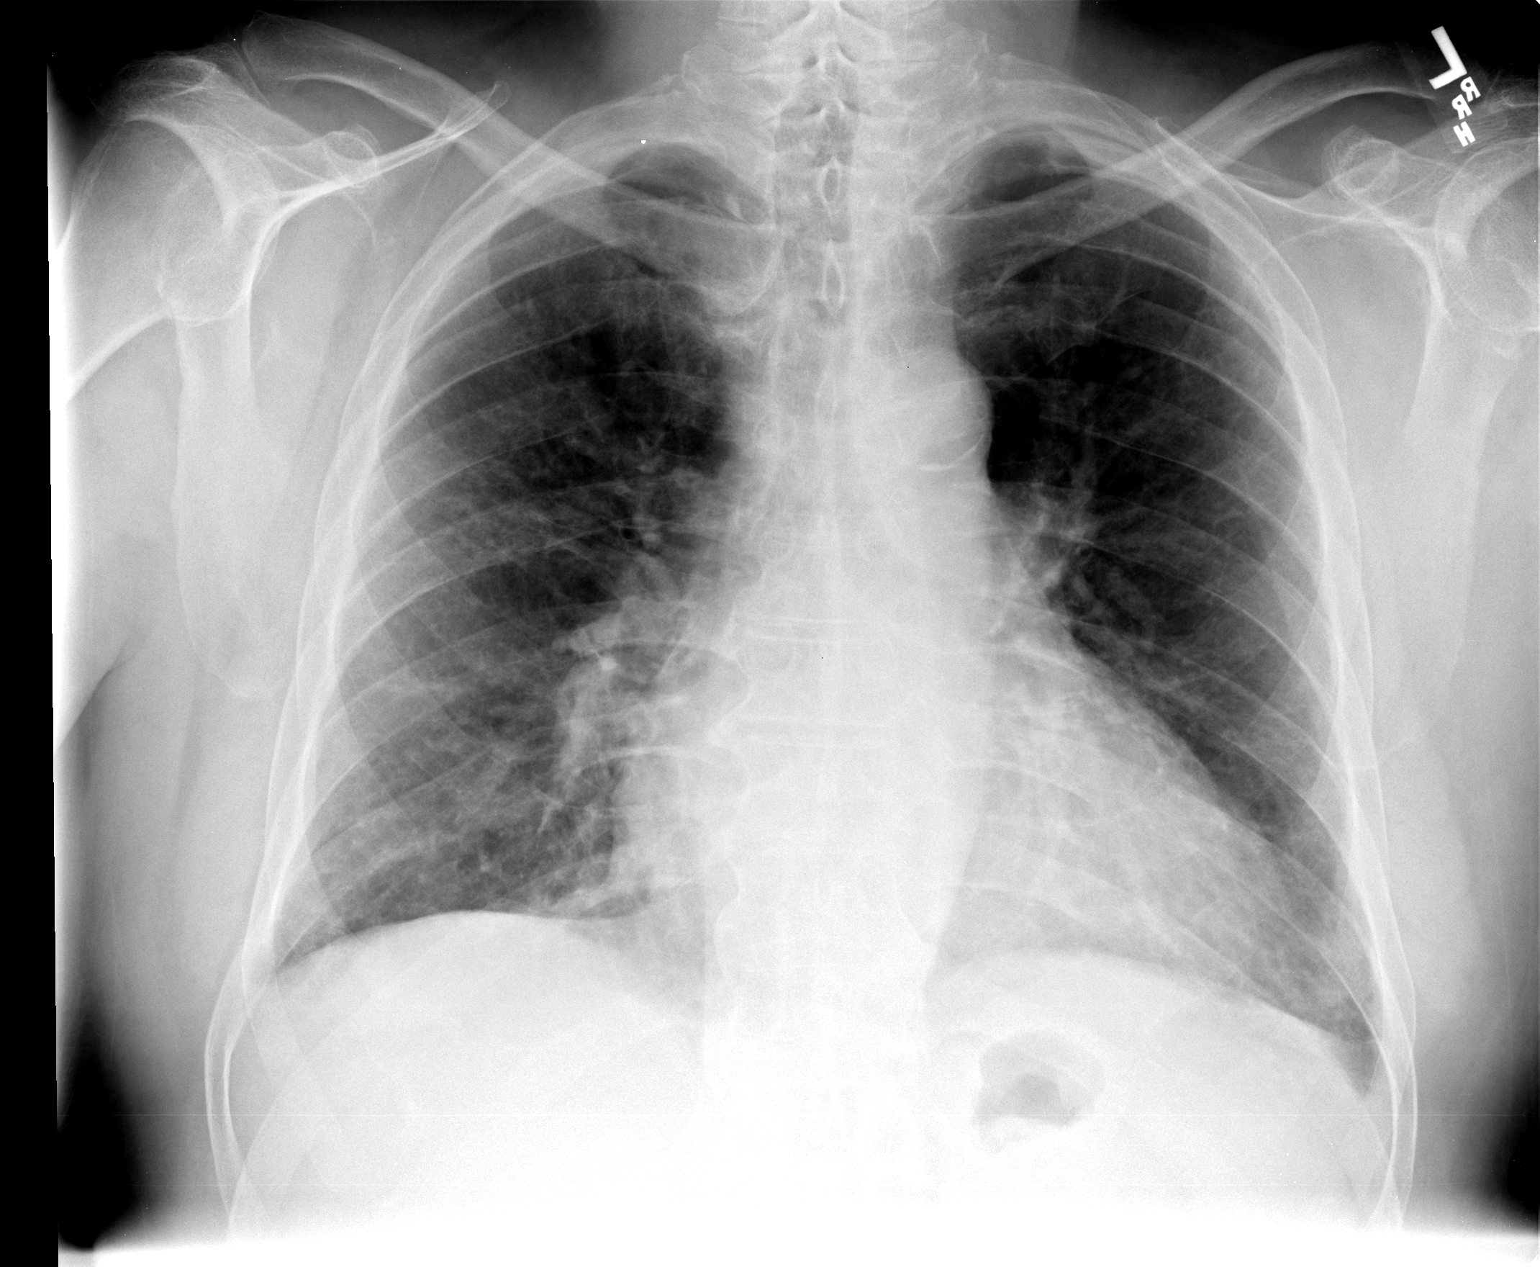

[view not recorded (2 of 2)]
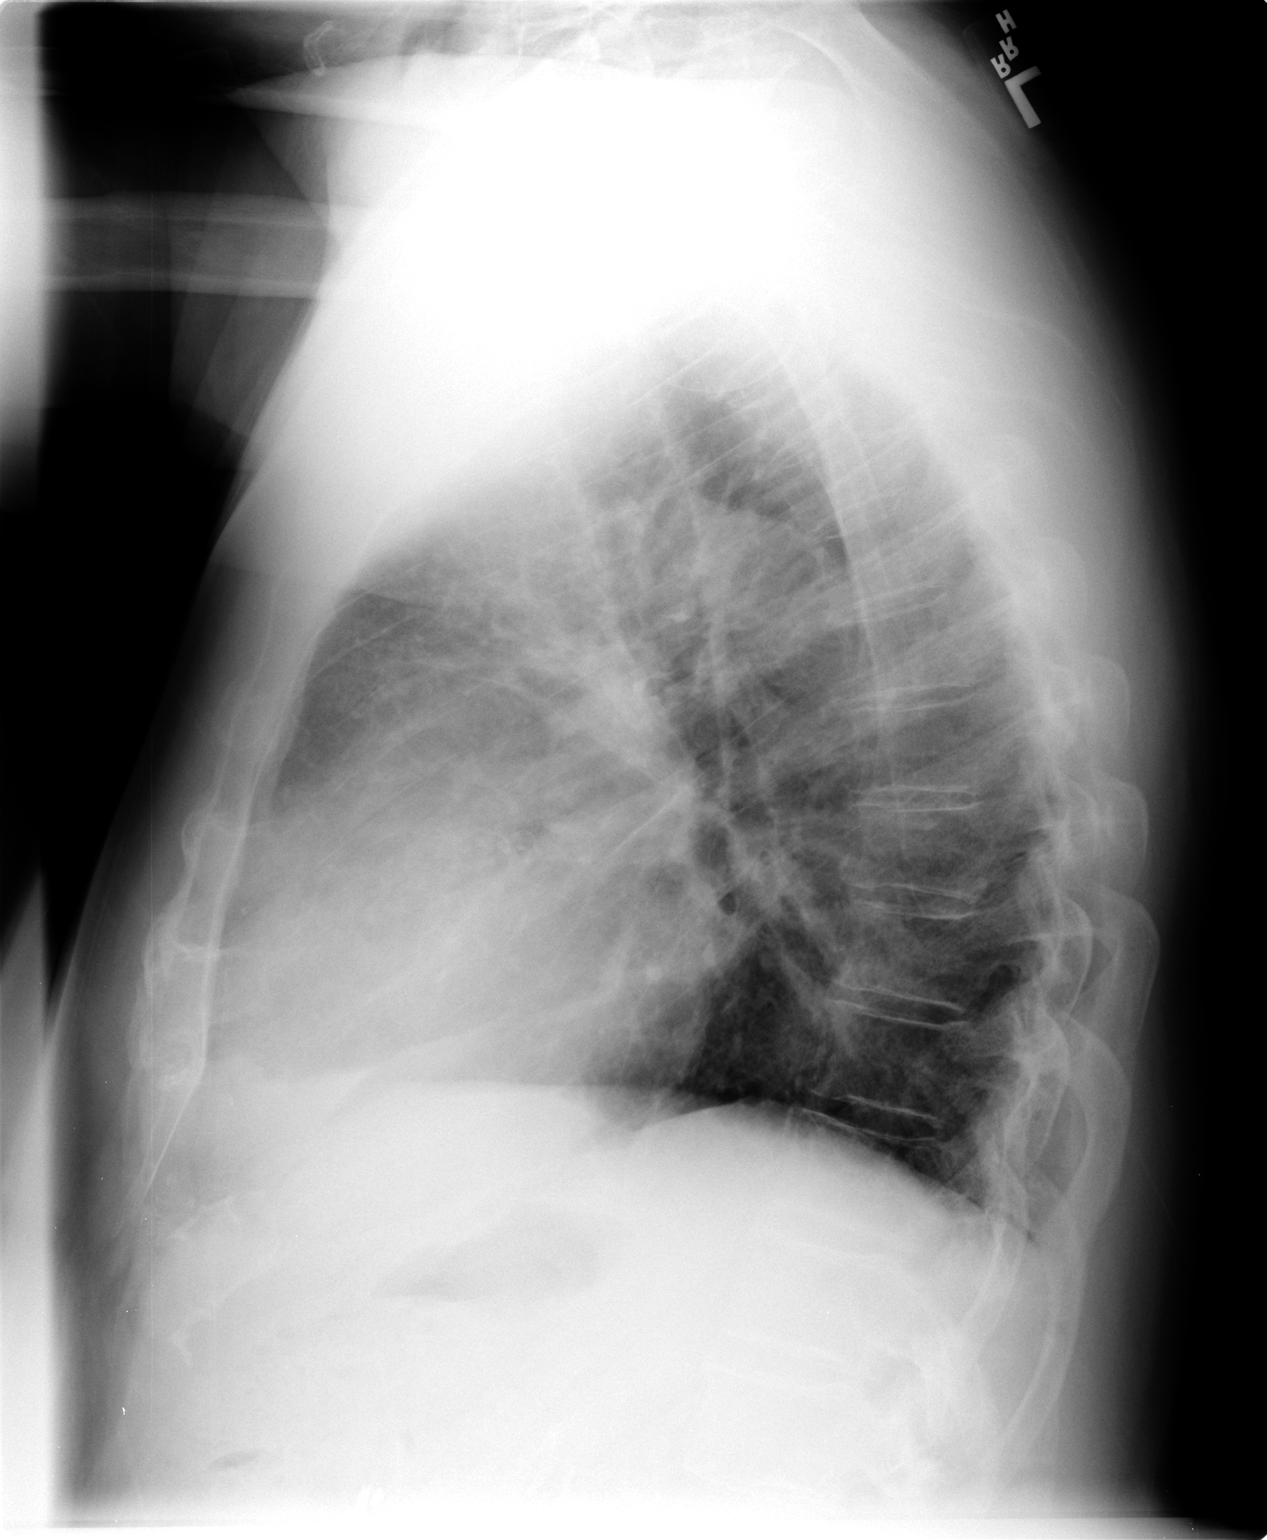

[2 of 2 positions shown; findings below may reference images not displayed]

FINDINGS: Hazy opacity in the right upper lobe seen on the prior study has
cleared. Questioned nodular opacity in the left upper lobe seen on
the prior study is not appreciated on this examination. Cardiomegaly
is noted. No pleural effusion or pneumothorax. No pneumothorax.
IMPRESSION: Resolved right upper lobe airspace disease.

Cardiomegaly without edema.

## 2015-12-08 ENCOUNTER — Other Ambulatory Visit: Payer: Self-pay | Admitting: Family Medicine

## 2015-12-08 DIAGNOSIS — M7989 Other specified soft tissue disorders: Secondary | ICD-10-CM

## 2015-12-09 NOTE — Telephone Encounter (Signed)
Tyrone Hall is stating that Tyrone Hall has started in the last 2-3 months having uncontrollable loose stools and he is having to wear depends. Tyrone Hall is asking if there is something that they can give him to control his bowels, Edo is scheduled to see Dr. Moshe Cipro Tuesday Aug 29th, please advise?

## 2015-12-14 ENCOUNTER — Ambulatory Visit (INDEPENDENT_AMBULATORY_CARE_PROVIDER_SITE_OTHER): Payer: Medicare Other | Admitting: Family Medicine

## 2015-12-14 ENCOUNTER — Encounter: Payer: Self-pay | Admitting: Family Medicine

## 2015-12-14 VITALS — BP 130/78 | HR 63 | Resp 16 | Ht 67.0 in | Wt 206.0 lb

## 2015-12-14 DIAGNOSIS — E559 Vitamin D deficiency, unspecified: Secondary | ICD-10-CM

## 2015-12-14 DIAGNOSIS — F0391 Unspecified dementia with behavioral disturbance: Secondary | ICD-10-CM

## 2015-12-14 DIAGNOSIS — R32 Unspecified urinary incontinence: Secondary | ICD-10-CM

## 2015-12-14 DIAGNOSIS — Z23 Encounter for immunization: Secondary | ICD-10-CM

## 2015-12-14 DIAGNOSIS — E785 Hyperlipidemia, unspecified: Secondary | ICD-10-CM | POA: Diagnosis not present

## 2015-12-14 DIAGNOSIS — E1121 Type 2 diabetes mellitus with diabetic nephropathy: Secondary | ICD-10-CM

## 2015-12-14 DIAGNOSIS — E538 Deficiency of other specified B group vitamins: Secondary | ICD-10-CM | POA: Diagnosis not present

## 2015-12-14 DIAGNOSIS — I10 Essential (primary) hypertension: Secondary | ICD-10-CM | POA: Diagnosis not present

## 2015-12-14 DIAGNOSIS — F03918 Unspecified dementia, unspecified severity, with other behavioral disturbance: Secondary | ICD-10-CM

## 2015-12-14 MED ORDER — DONEPEZIL HCL 10 MG PO TABS: ORAL_TABLET | ORAL | 3 refills | Status: DC

## 2015-12-14 MED ORDER — CYANOCOBALAMIN 1000 MCG/ML IJ SOLN
1000.0000 ug | Freq: Once | INTRAMUSCULAR | Status: AC
  Administered 2015-12-14: 1000 ug via INTRAMUSCULAR

## 2015-12-14 MED ORDER — LOSARTAN POTASSIUM 25 MG PO TABS: 25.0000 mg | ORAL_TABLET | Freq: Every day | ORAL | 3 refills | Status: AC

## 2015-12-14 NOTE — Patient Instructions (Addendum)
Nurse visit for B12 injection in 4 weeks  MD follow up in December  Fasting labs including microalb this week, hBA1C,  Lipid, cmp and eGGFR, hBA1C and vit D  Flu vaccine today  nO WANDERING, only leave home with family and friends  Careful not to fall  Thank you  for choosing [facility]. We consider it a privelige to serve you.  Delivering excellent health care in a caring and  compassionate way is our goal.  Partnering with you,  so that together we can achieve this goal is our strategy.

## 2015-12-15 DIAGNOSIS — E785 Hyperlipidemia, unspecified: Secondary | ICD-10-CM | POA: Diagnosis not present

## 2015-12-15 DIAGNOSIS — E1121 Type 2 diabetes mellitus with diabetic nephropathy: Secondary | ICD-10-CM | POA: Diagnosis not present

## 2015-12-15 DIAGNOSIS — E559 Vitamin D deficiency, unspecified: Secondary | ICD-10-CM | POA: Diagnosis not present

## 2015-12-15 DIAGNOSIS — I10 Essential (primary) hypertension: Secondary | ICD-10-CM | POA: Diagnosis not present

## 2015-12-15 LAB — COMPLETE METABOLIC PANEL WITH GFR
ALBUMIN: 3.5 g/dL — AB (ref 3.6–5.1)
ALK PHOS: 84 U/L (ref 40–115)
ALT: 11 U/L (ref 9–46)
AST: 13 U/L (ref 10–35)
BILIRUBIN TOTAL: 1 mg/dL (ref 0.2–1.2)
BUN: 19 mg/dL (ref 7–25)
CO2: 27 mmol/L (ref 20–31)
Calcium: 8.8 mg/dL (ref 8.6–10.3)
Chloride: 102 mmol/L (ref 98–110)
Creat: 1.92 mg/dL — ABNORMAL HIGH (ref 0.70–1.11)
GFR, EST AFRICAN AMERICAN: 36 mL/min — AB (ref >=60)
GFR, EST NON AFRICAN AMERICAN: 31 mL/min — AB (ref >=60)
GLUCOSE: 185 mg/dL — AB (ref 65–99)
POTASSIUM: 3.5 mmol/L (ref 3.5–5.3)
SODIUM: 137 mmol/L (ref 135–146)
Total Protein: 7 g/dL (ref 6.1–8.1)

## 2015-12-15 LAB — LIPID PANEL
CHOL/HDL RATIO: 4.2 ratio (ref <=5.0)
Cholesterol: 165 mg/dL (ref 125–200)
HDL: 39 mg/dL — AB (ref >=40)
LDL Cholesterol: 102 mg/dL (ref <130)
TRIGLYCERIDES: 118 mg/dL (ref <150)
VLDL: 24 mg/dL (ref <30)

## 2015-12-15 LAB — HEMOGLOBIN A1C
Hgb A1c MFr Bld: 7.4 % — ABNORMAL HIGH (ref <5.7)
MEAN PLASMA GLUCOSE: 166 mg/dL

## 2015-12-16 ENCOUNTER — Other Ambulatory Visit: Payer: Self-pay | Admitting: Family Medicine

## 2015-12-16 LAB — MICROALBUMIN / CREATININE URINE RATIO
CREATININE, URINE: 189 mg/dL (ref 20–370)
Microalb Creat Ratio: 113 mcg/mg creat — ABNORMAL HIGH (ref <30)
Microalb, Ur: 21.4 mg/dL

## 2015-12-16 LAB — VITAMIN D 25 HYDROXY (VIT D DEFICIENCY, FRACTURES): VIT D 25 HYDROXY: 15 ng/mL — AB (ref 30–100)

## 2015-12-17 ENCOUNTER — Other Ambulatory Visit: Payer: Self-pay

## 2015-12-17 MED ORDER — LINAGLIPTIN 5 MG PO TABS: 5.0000 mg | ORAL_TABLET | Freq: Every day | ORAL | 5 refills | Status: DC

## 2015-12-20 ENCOUNTER — Encounter: Payer: Self-pay | Admitting: Family Medicine

## 2015-12-20 NOTE — Assessment & Plan Note (Signed)
Need to change from metformin to tradjentra due to reduced creatinine clearance also c/o incontinence, attempts being made after the visit to directly com,municate with family member, still in progress

## 2015-12-20 NOTE — Assessment & Plan Note (Signed)
Increasing difficulty in educing accidents, pt incapable, cost of protective garments challenging , family working on getting him help with dx of dementia this should be an option even if not on medicaid, will check more into this

## 2015-12-20 NOTE — Assessment & Plan Note (Signed)
After obtaining informed consent, the vaccine is  administered by LPN.  

## 2015-12-20 NOTE — Progress Notes (Signed)
Tyrone Hall     MRN: [id number]      DOB: October 06, 1930   HPI Tyrone Hall is here for follow up and re-evaluation of chronic medical conditions, medication management and review of any available recent lab and radiology data. He is accompanied by his ex wife today who states that shew has relocated in past 3 months to live closer to him to assist with his care. C/o loose stool and incontinence, and inability to control his wandering , both are being worked on and now his son also seems to be more involved in his care, family trying to keep him at home Preventive health is updated, specifically   Immunization.     ROS. History by family member, pt incapable due to severe dementia Denies recent fever or chills. Denies sinus pressure, nasal congestion, ear pain or sore throat. Denies chest congestion, productive cough or wheezing. Denies chest pains, palpitations and leg swelling Denies abdominal pain, nausea, vomiting,diarrhea or constipation.   Denies dysuria, frequency, hesitancy or incontinence. Denies uncontrolled  joint pain, swelling and does have  limitation in mobility and increased fall risk Denies skin break down or rash.   PE  BP 130/78   Pulse 63   Resp 16   Ht 5\' 7"  (1.702 m)   Wt 206 lb (93.4 kg)   SpO2 98%   BMI 32.26 kg/m   Patient alert  and in no cardiopulmonary distress.  HEENT: No facial asymmetry, EOMI,   oropharynx pink and moist.  Neck decreased ROM no JVD, no mass.  Chest: Clear to auscultation bilaterally.  CVS: S1, S2 no murmurs, no S3.Regular rate.  ABD: Soft non tender.   Ext: No edema  MS: Decreased ROM spine, shoulders, hips and knees.  Skin: Intact, no ulcerations or rash noted.    CNS: CN 2-12 intact, power,  normal throughout.no focal deficits noted.   Assessment & Plan  Essential hypertension, benign Controlled, no change in medication DASH diet and commitment to daily physical activity for a minimum of 30 minutes discussed and  encouraged, as a part of hypertension management. The importance of attaining a healthy weight is also discussed.  BP/Weight 12/14/2015 09/06/2015 05/17/2015 01/12/2015 12/24/2014 11/05/2014 Q000111Q  Systolic BP [id number] 123XX123 0000000 - 123456 123456  Diastolic BP 78 78 68 80 - 82 76  Wt. (Lbs) 206 218 205 196 198.08 203 206  BMI 32.26 34.14 32.1 30.69 31.02 31.79 32.26       Dementia with behavioral disturbance Ongoing threat of wondering, discussed openly the importance of patient's safety in this regard and the need to ensure he is kept safe  Incontinence Increasing difficulty in educing accidents, pt incapable, cost of protective garments challenging , family working on getting him help with dx of dementia this should be an option even if not on medicaid, will check more into this  B12 deficiency B12 1 cc administered IM at visit  Diabetes mellitus with kidney disease Need to change from metformin to tradjentra due to reduced creatinine clearance also c/o incontinence, attempts being made after the visit to directly com,municate with family member, still in progress  Hyperlipidemia LDL goal <100 Hyperlipidemia:Low fat diet discussed and encouraged.   Lipid Panel  Lab Results  Component Value Date   CHOL 165 12/14/2015   HDL 39 (L) 12/14/2015   LDLCALC 102 12/14/2015   TRIG 118 12/14/2015   CHOLHDL 4.2 12/14/2015   Controlled, no change in medication     Need for influenza  vaccination After obtaining informed consent, the vaccine is  administered by LPN.

## 2015-12-20 NOTE — Assessment & Plan Note (Signed)
Hyperlipidemia:Low fat diet discussed and encouraged.   Lipid Panel  Lab Results  Component Value Date   CHOL 165 12/14/2015   HDL 39 (L) 12/14/2015   LDLCALC 102 12/14/2015   TRIG 118 12/14/2015   CHOLHDL 4.2 12/14/2015   Controlled, no change in medication

## 2015-12-20 NOTE — Assessment & Plan Note (Signed)
Ongoing threat of wondering, discussed openly the importance of patient's safety in this regard and the need to ensure he is kept safe

## 2015-12-20 NOTE — Assessment & Plan Note (Signed)
Controlled, no change in medication DASH diet and commitment to daily physical activity for a minimum of 30 minutes discussed and encouraged, as a part of hypertension management. The importance of attaining a healthy weight is also discussed.  BP/Weight 12/14/2015 09/06/2015 05/17/2015 01/12/2015 12/24/2014 11/05/2014 Q000111Q  Systolic BP [id number] 123XX123 0000000 - 123456 123456  Diastolic BP 78 78 68 80 - 82 76  Wt. (Lbs) 206 218 205 196 198.08 203 206  BMI 32.26 34.14 32.1 30.69 31.02 31.79 32.26

## 2015-12-20 NOTE — Assessment & Plan Note (Signed)
B12 1 cc administered IM at visit

## 2015-12-21 ENCOUNTER — Other Ambulatory Visit: Payer: Self-pay

## 2015-12-21 MED ORDER — ERGOCALCIFEROL 1.25 MG (50000 UT) PO CAPS
50000.0000 [IU] | ORAL_CAPSULE | ORAL | 1 refills | Status: DC
Start: 2015-12-21 — End: 2016-03-03

## 2015-12-24 ENCOUNTER — Other Ambulatory Visit: Payer: Self-pay | Admitting: Family Medicine

## 2015-12-29 ENCOUNTER — Other Ambulatory Visit: Payer: Self-pay

## 2015-12-29 ENCOUNTER — Telehealth: Payer: Self-pay

## 2015-12-29 ENCOUNTER — Other Ambulatory Visit: Payer: Self-pay | Admitting: Family Medicine

## 2015-12-29 MED ORDER — GLIPIZIDE ER 2.5 MG PO TB24: 2.5000 mg | ORAL_TABLET | Freq: Every day | ORAL | 3 refills | Status: DC

## 2015-12-29 NOTE — Telephone Encounter (Signed)
Glipizide entered pls send and let family know

## 2015-12-29 NOTE — Telephone Encounter (Signed)
Called daughter.  Unable to leave message.   Will send Glipizide to pharmacy and try daughter again later.

## 2015-12-30 NOTE — Telephone Encounter (Signed)
Daughter aware.

## 2016-01-11 ENCOUNTER — Ambulatory Visit: Payer: Medicare Other

## 2016-01-24 ENCOUNTER — Encounter: Payer: Self-pay | Admitting: Family Medicine

## 2016-01-24 ENCOUNTER — Ambulatory Visit (INDEPENDENT_AMBULATORY_CARE_PROVIDER_SITE_OTHER): Payer: Medicare Other | Admitting: Family Medicine

## 2016-01-24 VITALS — BP 114/80 | HR 70 | Ht 67.0 in | Wt 191.0 lb

## 2016-01-24 DIAGNOSIS — E785 Hyperlipidemia, unspecified: Secondary | ICD-10-CM

## 2016-01-24 DIAGNOSIS — E1121 Type 2 diabetes mellitus with diabetic nephropathy: Secondary | ICD-10-CM | POA: Diagnosis not present

## 2016-01-24 DIAGNOSIS — N39498 Other specified urinary incontinence: Secondary | ICD-10-CM

## 2016-01-24 DIAGNOSIS — F0281 Dementia in other diseases classified elsewhere with behavioral disturbance: Secondary | ICD-10-CM

## 2016-01-24 DIAGNOSIS — G308 Other Alzheimer's disease: Secondary | ICD-10-CM

## 2016-01-24 DIAGNOSIS — E538 Deficiency of other specified B group vitamins: Secondary | ICD-10-CM | POA: Diagnosis not present

## 2016-01-24 DIAGNOSIS — I10 Essential (primary) hypertension: Secondary | ICD-10-CM | POA: Diagnosis not present

## 2016-01-24 DIAGNOSIS — G309 Alzheimer's disease, unspecified: Secondary | ICD-10-CM

## 2016-01-24 LAB — COMPLETE METABOLIC PANEL WITH GFR
ALT: 7 U/L — ABNORMAL LOW (ref 9–46)
AST: 11 U/L (ref 10–35)
Albumin: 3.4 g/dL — ABNORMAL LOW (ref 3.6–5.1)
Alkaline Phosphatase: 76 U/L (ref 40–115)
BUN: 15 mg/dL (ref 7–25)
CHLORIDE: 106 mmol/L (ref 98–110)
CO2: 25 mmol/L (ref 20–31)
CREATININE: 2.25 mg/dL — AB (ref 0.70–1.11)
Calcium: 9 mg/dL (ref 8.6–10.3)
GFR, EST AFRICAN AMERICAN: 30 mL/min — AB (ref >=60)
GFR, EST NON AFRICAN AMERICAN: 26 mL/min — AB (ref >=60)
Glucose, Bld: 115 mg/dL — ABNORMAL HIGH (ref 65–99)
POTASSIUM: 3.9 mmol/L (ref 3.5–5.3)
Sodium: 140 mmol/L (ref 135–146)
Total Bilirubin: 0.9 mg/dL (ref 0.2–1.2)
Total Protein: 6.9 g/dL (ref 6.1–8.1)

## 2016-01-24 MED ORDER — CYANOCOBALAMIN 1000 MCG/ML IJ SOLN
1000.0000 ug | Freq: Once | INTRAMUSCULAR | Status: AC
  Administered 2016-01-24: 1000 ug via INTRAMUSCULAR

## 2016-01-24 NOTE — Assessment & Plan Note (Signed)
1cc administered at visit after informed consent IM

## 2016-01-24 NOTE — Patient Instructions (Signed)
F/u in December as before, call if you need me soner  Non fast chem 7 and EGFR today  Please be careful when walking as not seeing well, declutter house  No WANDERING  Fasting labs for December viasit  Thank you  for choosing [facility]. We consider it a privelige to serve you.  Delivering excellent health care in a caring and  compassionate way is our goal.  Partnering with you,  so that together we can achieve this goal is our strategy.

## 2016-01-24 NOTE — Assessment & Plan Note (Signed)
Continued challenge, hygiene and personal care needs to be better addressed

## 2016-01-24 NOTE — Assessment & Plan Note (Signed)
Hyperlipidemia:Low fat diet discussed and encouraged.   Lipid Panel  Lab Results  Component Value Date   CHOL 165 12/14/2015   HDL 39 (L) 12/14/2015   LDLCALC 102 12/14/2015   TRIG 118 12/14/2015   CHOLHDL 4.2 12/14/2015     Controlled, no change in medication

## 2016-01-24 NOTE — Assessment & Plan Note (Signed)
Tyrone Hall is reminded of the importance of commitment to daily physical activity for 30 minutes or more, as able and the need to limit carbohydrate intake to 30 to 60 grams per meal to help with blood sugar control.   The need to take medication as prescribed, test blood sugar as directed, and to call between visits if there is a concern that blood sugar is uncontrolled is also discussed.   Tyrone Hall is reminded of the importance of daily foot exam, annual eye examination, and good blood sugar, blood pressure and cholesterol control.  Diabetic Labs Latest Ref Rng & Units 12/14/2015 09/06/2015 05/17/2015 11/05/2014 09/25/2014  HbA1c <5.7 % 7.4(H) 7.4(H) 6.8(H) - 7.3(H)  Microalbumin Not estab mg/dL 21.4 - - 1.6 -  Micro/Creat Ratio <30 mcg/mg creat 113(H) - - 6.3 -  Chol 125 - 200 mg/dL 165 - 189 - 243(H)  HDL >=40 mg/dL 39(L) - 43 - 35(L)  Calc LDL <130 mg/dL 102 - 125 - 173(H)  Triglycerides <150 mg/dL 118 - 107 - 173(H)  Creatinine 0.70 - 1.11 mg/dL 1.92(H) 1.40(H) 1.16(H) - 1.42(H)   BP/Weight 01/24/2016 12/14/2015 09/06/2015 05/17/2015 01/12/2015 12/24/2014 123456  Systolic BP 99991111 [id number] A999333 123XX123 0000000 - 123456  Diastolic BP 80 78 78 68 80 - 82  Wt. (Lbs) 191 206 218 205 196 198.08 203  BMI 29.91 32.26 34.14 32.1 30.69 31.02 31.79   Foot/eye exam completion dates Latest Ref Rng & Units 10/07/2014 07/23/2013  Eye Exam No Retinopathy - -  Foot Form Completion - Done Done      Updated lab needed at/ before next visit.

## 2016-01-24 NOTE — Assessment & Plan Note (Signed)
Controlled, no change in medication  

## 2016-01-24 NOTE — Assessment & Plan Note (Signed)
Caregiver reports less wandering , continue current management, he requires 24 hr supervision

## 2016-01-24 NOTE — Progress Notes (Signed)
Tyrone Hall     MRN: [id number]      DOB: 1930/10/21   HPI Mr. Kopplin is here for follow up and re-evaluation of chronic medical conditions, medication management and review of any available recent lab and radiology data.  Preventive health is updated, specifically  Cancer screening and Immunization.    The PT denies any adverse reactions to current medications since the last visit.  Accompanying caregiver reports that pt will not allow her to bathe him, and he still wanders ocassionally   ROS History from caregiver, pt incapable due to severe dementia Denies recent fever or chills. Denies sinus pressure, nasal congestion,  Denies chest congestion, productive cough or wheezing. Denies  leg swelling Denies nausea, vomit or  constipation.  Reports good appetite Denies dysuria, frequency, hesitancy or incontinence. C/o limitation in mobility. Denies headaches, seizures, numbness, or tingling. Denies depression, anxiety or insomnia. Denies skin break down or rash.   PE  BP 114/80   Pulse 70   Ht 5\' 7"  (1.702 m)   Wt 191 lb (86.6 kg)   SpO2 97%   BMI 29.91 kg/m   Patient alert  and in no cardiopulmonary distress.decreased vision, and incapable of providing history due to severe demntia  HEENT: No facial asymmetry, EOMI,   oropharynx pink and moist.  Neck decreased ROM no JVD, no mass.  Chest: Clear to auscultation bilaterally.  CVS: S1, S2 no murmurs, no S3.IrRegular rate.  ABD: Soft non tender.   Ext: No edema  MS: decreased  ROM spine, shoulders, hips and knees.  Skin: Intact, no ulcerations or rash noted.  Psych: Good eye contact, normal affect. Memory loss, severe, not anxious or depressed appearing.  CNS: , power,  normal throughout.no focal deficits noted.   Assessment & Plan  Essential hypertension, benign Controlled, no change in medication   Diabetes mellitus with kidney disease Mr. Mathenia is reminded of the importance of commitment to daily  physical activity for 30 minutes or more, as able and the need to limit carbohydrate intake to 30 to 60 grams per meal to help with blood sugar control.   The need to take medication as prescribed, test blood sugar as directed, and to call between visits if there is a concern that blood sugar is uncontrolled is also discussed.   Mr. Lenton is reminded of the importance of daily foot exam, annual eye examination, and good blood sugar, blood pressure and cholesterol control.  Diabetic Labs Latest Ref Rng & Units 12/14/2015 09/06/2015 05/17/2015 11/05/2014 09/25/2014  HbA1c <5.7 % 7.4(H) 7.4(H) 6.8(H) - 7.3(H)  Microalbumin Not estab mg/dL 21.4 - - 1.6 -  Micro/Creat Ratio <30 mcg/mg creat 113(H) - - 6.3 -  Chol 125 - 200 mg/dL 165 - 189 - 243(H)  HDL >=40 mg/dL 39(L) - 43 - 35(L)  Calc LDL <130 mg/dL 102 - 125 - 173(H)  Triglycerides <150 mg/dL 118 - 107 - 173(H)  Creatinine 0.70 - 1.11 mg/dL 1.92(H) 1.40(H) 1.16(H) - 1.42(H)   BP/Weight 01/24/2016 12/14/2015 09/06/2015 05/17/2015 01/12/2015 12/24/2014 123456  Systolic BP 99991111 [id number] A999333 123XX123 0000000 - 123456  Diastolic BP 80 78 78 68 80 - 82  Wt. (Lbs) 191 206 218 205 196 198.08 203  BMI 29.91 32.26 34.14 32.1 30.69 31.02 31.79   Foot/eye exam completion dates Latest Ref Rng & Units 10/07/2014 07/23/2013  Eye Exam No Retinopathy - -  Foot Form Completion - Done Done      Updated lab needed at/ before  next visit.   Dementia with behavioral disturbance Caregiver reports less wandering , continue current management, he requires 24 hr supervision  Incontinence Continued challenge, hygiene and personal care needs to be better addressed  Hyperlipidemia LDL goal <100 Hyperlipidemia:Low fat diet discussed and encouraged.   Lipid Panel  Lab Results  Component Value Date   CHOL 165 12/14/2015   HDL 39 (L) 12/14/2015   LDLCALC 102 12/14/2015   TRIG 118 12/14/2015   CHOLHDL 4.2 12/14/2015     Controlled, no change in medication    B12  deficiency 1cc administered at visit after informed consent IM

## 2016-01-25 ENCOUNTER — Other Ambulatory Visit: Payer: Self-pay | Admitting: Family Medicine

## 2016-01-25 DIAGNOSIS — N184 Chronic kidney disease, stage 4 (severe): Secondary | ICD-10-CM

## 2016-01-27 ENCOUNTER — Telehealth: Payer: Self-pay

## 2016-01-27 NOTE — Telephone Encounter (Signed)
Called No answer.

## 2016-01-27 NOTE — Telephone Encounter (Signed)
-----   Message from Fayrene Helper, MD sent at 01/25/2016  4:37 AM EDT ----- Worsened kidney function, no med changes now, needs to see nephrology, I have entered referral to Dr Birdie Sons, pls let daughter Newt Lukes, or other responsible family member on file know, thanks

## 2016-02-04 ENCOUNTER — Encounter (HOSPITAL_COMMUNITY): Payer: Self-pay | Admitting: Emergency Medicine

## 2016-02-04 ENCOUNTER — Telehealth: Payer: Self-pay

## 2016-02-04 ENCOUNTER — Emergency Department (HOSPITAL_COMMUNITY)
Admission: EM | Admit: 2016-02-04 | Discharge: 2016-02-04 | Disposition: A | Discharge status: Home / Self Care | Payer: Medicare Other | Attending: Emergency Medicine | Admitting: Emergency Medicine

## 2016-02-04 DIAGNOSIS — I10 Essential (primary) hypertension: Secondary | ICD-10-CM | POA: Insufficient documentation

## 2016-02-04 DIAGNOSIS — Z7982 Long term (current) use of aspirin: Secondary | ICD-10-CM | POA: Diagnosis not present

## 2016-02-04 DIAGNOSIS — E119 Type 2 diabetes mellitus without complications: Secondary | ICD-10-CM | POA: Insufficient documentation

## 2016-02-04 DIAGNOSIS — Z87891 Personal history of nicotine dependence: Secondary | ICD-10-CM | POA: Insufficient documentation

## 2016-02-04 DIAGNOSIS — N3001 Acute cystitis with hematuria: Secondary | ICD-10-CM | POA: Diagnosis not present

## 2016-02-04 DIAGNOSIS — Z8673 Personal history of transient ischemic attack (TIA), and cerebral infarction without residual deficits: Secondary | ICD-10-CM | POA: Insufficient documentation

## 2016-02-04 DIAGNOSIS — R1909 Other intra-abdominal and pelvic swelling, mass and lump: Secondary | ICD-10-CM | POA: Diagnosis present

## 2016-02-04 DIAGNOSIS — Z79899 Other long term (current) drug therapy: Secondary | ICD-10-CM | POA: Insufficient documentation

## 2016-02-04 DIAGNOSIS — I2510 Atherosclerotic heart disease of native coronary artery without angina pectoris: Secondary | ICD-10-CM | POA: Diagnosis not present

## 2016-02-04 DIAGNOSIS — Z7984 Long term (current) use of oral hypoglycemic drugs: Secondary | ICD-10-CM | POA: Diagnosis not present

## 2016-02-04 DIAGNOSIS — K4090 Unilateral inguinal hernia, without obstruction or gangrene, not specified as recurrent: Secondary | ICD-10-CM

## 2016-02-04 LAB — BASIC METABOLIC PANEL
Anion gap: 7 (ref 5–15)
BUN: 20 mg/dL (ref 6–20)
CHLORIDE: 107 mmol/L (ref 101–111)
CO2: 26 mmol/L (ref 22–32)
CREATININE: 2.24 mg/dL — AB (ref 0.61–1.24)
Calcium: 8.8 mg/dL — ABNORMAL LOW (ref 8.9–10.3)
GFR calc non Af Amer: 25 mL/min — ABNORMAL LOW (ref >60)
GFR, EST AFRICAN AMERICAN: 29 mL/min — AB (ref >60)
Glucose, Bld: 78 mg/dL (ref 65–99)
POTASSIUM: 4.1 mmol/L (ref 3.5–5.1)
SODIUM: 140 mmol/L (ref 135–145)

## 2016-02-04 LAB — CBC WITH DIFFERENTIAL/PLATELET
BASOS ABS: 0 10*3/uL (ref 0.0–0.1)
Basophils Relative: 0 %
EOS ABS: 0.3 10*3/uL (ref 0.0–0.7)
Eosinophils Relative: 3 %
HCT: 36.9 % — ABNORMAL LOW (ref 39.0–52.0)
HEMOGLOBIN: 13 g/dL (ref 13.0–17.0)
LYMPHS ABS: 2.9 10*3/uL (ref 0.7–4.0)
Lymphocytes Relative: 29 %
MCH: 28 pg (ref 26.0–34.0)
MCHC: 35.2 g/dL (ref 30.0–36.0)
MCV: 79.5 fL (ref 78.0–100.0)
Monocytes Absolute: 0.9 10*3/uL (ref 0.1–1.0)
Monocytes Relative: 9 %
NEUTROS PCT: 59 %
Neutro Abs: 5.8 10*3/uL (ref 1.7–7.7)
Platelets: 125 10*3/uL — ABNORMAL LOW (ref 150–400)
RBC: 4.64 MIL/uL (ref 4.22–5.81)
RDW: 15.9 % — ABNORMAL HIGH (ref 11.5–15.5)
WBC: 9.9 10*3/uL (ref 4.0–10.5)

## 2016-02-04 LAB — URINALYSIS, ROUTINE W REFLEX MICROSCOPIC
Bilirubin Urine: NEGATIVE
Glucose, UA: NEGATIVE mg/dL
Ketones, ur: NEGATIVE mg/dL
LEUKOCYTES UA: NEGATIVE
NITRITE: NEGATIVE
Protein, ur: NEGATIVE mg/dL
SPECIFIC GRAVITY, URINE: 1.01 (ref 1.005–1.030)
pH: 6 (ref 5.0–8.0)

## 2016-02-04 LAB — URINE MICROSCOPIC-ADD ON

## 2016-02-04 MED ORDER — CEPHALEXIN 500 MG PO CAPS: 500.0000 mg | ORAL_CAPSULE | Freq: Four times a day (QID) | ORAL | 0 refills | Status: DC

## 2016-02-04 MED ORDER — CEPHALEXIN 500 MG PO CAPS
500.0000 mg | ORAL_CAPSULE | Freq: Once | ORAL | Status: AC
  Administered 2016-02-04: 500 mg via ORAL
  Filled 2016-02-04: qty 1

## 2016-02-04 NOTE — Telephone Encounter (Signed)
Daughter called and said patient was bleeding from the rectum yesterday because she saw it on his commode. Today she noticed a big lump/mass near his groin area. Not a boil per daughter but she is concerned about a tumor. Advised daughter to take him to ER for evaluation and follow up here next week.

## 2016-02-04 NOTE — Telephone Encounter (Signed)
Noted and agree. 

## 2016-02-04 NOTE — Discharge Instructions (Signed)
Urine sent for culture to confirm infection. Definitely has blood in the urine. Take the antibiotic as directed for the next 7 days. Make an appointment to follow-up with his doctor to make sure that the blood in the urine goes away. Return for any new or worse symptoms.

## 2016-02-04 NOTE — ED Triage Notes (Signed)
PT daughter reports blood along the toilet seat this am after pt had a bowel movement and also reports right sided groin swelling starting yesterday. PT has dementia and doesn't recall blood in stool but does c/o tenderness to groin area.

## 2016-02-04 NOTE — ED Provider Notes (Signed)
[facility] DEPT Provider Note   CSN: [id number] Arrival date & time: 02/04/16  1326     History   Chief Complaint Chief Complaint  Patient presents with  . Groin Swelling    HPI Tyrone Hall is a 82 y.o. male.  Brought in by caregiver. Yesterday they saw blood on the  commode seat. No blood noted in his depends. They're not sure where this blood is coming from. They are also concerned about a bulge or bump in the groin area. Patient has a history of dementia.      Past Medical History:  Diagnosis Date  . Acute biliary pancreatitis   . Anemia   . Atrial fibrillation ([place])   . B12 deficiency   . Carotid artery disease (HCC)    60-79% RICA and 123456 LICA -  [id number]  . Coronary atherosclerosis of native coronary artery    Details not clear  . Dementia   . Essential hypertension, benign   . Mixed hyperlipidemia   . OA (osteoarthritis) of knee   . Stroke [facility]) 2008  . TIA (transient ischemic attack)   . Type 2 diabetes mellitus [facility])     Patient Active Problem List   Diagnosis Date Noted  . Falls frequently 09/06/2015  . LVH (left ventricular hypertrophy) due to hypertensive disease 11/06/2014  . Acute CVA (cerebrovascular accident) ([place]) 11/06/2014  . Incontinence 11/05/2014  . Heme positive stool 10/01/2013  . Elevated PSA 04/22/2013  . Onychomycosis 03/06/2012  . Aortic stenosis 09/15/2010  . Atrial fibrillation ([place]) 08/23/2010  . Dementia with behavioral disturbance 01/10/2009  . 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 Anemia, iron deficiency 12/11/2007  . CAROTID STENOSIS 12/11/2007  . B12 deficiency 12/09/2007  . Hyperlipidemia LDL goal <100 09/11/2007  . Essential hypertension, benign 09/11/2007  . Coronary atherosclerosis of native coronary artery 09/11/2007  . DEGENERATIVE JOINT DISEASE 09/11/2007  . OSTEOARTHRITIS, KNEE, SEVERE 09/11/2007    Past Surgical History:  Procedure Laterality Date  . CHOLECYSTECTOMY    .  COLONOSCOPY    01/20/2008   [id number], normal rectum, left-sided diverticula  . Common bile duct stone removal     s/p ercp with biliary and pancreatic stent placement and subsequent removal 2005  . ESOPHAGOGASTRODUODENOSCOPY    01/20/2008   [id number] esophagus, small hiatal hernia, healing ulcer scar antrum . bx mild inflammation c/w with healed ulceration. no h.pylori, small bowel bx negative.  . ESOPHAGOGASTRODUODENOSCOPY N/A 10/29/2013   RMR: gastric erosinons of uncertain significance-status post gastric biopsy.  Marland Kitchen HEMORRHOID SURGERY         Home Medications    Prior to Admission medications   Medication Sig Start Date End Date Taking? Authorizing Provider  aspirin (ASPIRIN LOW DOSE) 81 MG EC tablet Take 81 mg by mouth daily.    Yes Historical Provider, MD  cetirizine (ZYRTEC) 10 MG tablet Take 10 mg by mouth daily.   Yes Historical Provider, MD  diltiazem (CARDIZEM CD) 180 MG 24 hr capsule TAKE ONE CAPSULE BY MOUTH ONCE DAILY AT 2PM. 12/27/15  Yes Fayrene Helper, MD  donepezil (ARICEPT) 10 MG tablet TAKE (1) TABLET BY MOUTH AT BEDTIME. 12/14/15  Yes Fayrene Helper, MD  ergocalciferol (VITAMIN D2) 50000 units capsule Take 1 capsule (50,000 Units total) by mouth once a week. One capsule once weekly 12/21/15  Yes Fayrene Helper, MD  ferrous sulfate 325 (65 FE) MG tablet Take 325 mg by mouth daily with breakfast.   Yes Historical Provider, MD  furosemide (LASIX) 20 MG tablet TAKE ONE TABLET BY MOUTH ONCE DAILY. 12/09/15  Yes Fayrene Helper, MD  glipiZIDE (GLUCOTROL XL) 2.5 MG 24 hr tablet Take 1 tablet (2.5 mg total) by mouth daily with breakfast. 12/29/15  Yes Fayrene Helper, MD  losartan (COZAAR) 25 MG tablet Take 1 tablet (25 mg total) by mouth daily. 12/14/15  Yes Fayrene Helper, MD  lovastatin (MEVACOR) 40 MG tablet TAKE (1) TABLET BY MOUTH AT BEDTIME. 09/07/15  Yes Fayrene Helper, MD  cephALEXin (KEFLEX) 500 MG capsule Take 1 capsule (500 mg total) by  mouth 4 (four) times daily. 02/04/16   Fredia Sorrow, MD    Family History Family History  Problem Relation Age of Onset  . Hypertension Son   . Diabetes type II    . Colon cancer Neg Hx     Social History Social History  Substance Use Topics  . Smoking status: Former Smoker    Types: Cigarettes    Quit date: 05/18/1983  . Smokeless tobacco: Never Used     Comment: Quit x 50 years  . Alcohol use No     Allergies   Review of patient's allergies indicates no known allergies.   Review of Systems Review of Systems  Unable to perform ROS: Dementia     Physical Exam Updated Vital Signs BP (!) 190/110 (BP Location: Right Arm)   Pulse 89   Temp 97.6 F (36.4 C) (Oral)   Resp 18   Ht 5\' 9"  (1.753 m)   Wt 86.2 kg   SpO2 100%   BMI 28.06 kg/m   Physical Exam  Constitutional: He is oriented to person, place, and time. He appears well-developed and well-nourished. No distress.  HENT:  Head: Normocephalic and atraumatic.  Mouth/Throat: Oropharynx is clear and moist.  Eyes: EOM are normal. Pupils are equal, round, and reactive to light.  Neck: Normal range of motion. Neck supple.  Cardiovascular: Normal rate, regular rhythm and normal heart sounds.   Pulmonary/Chest: Effort normal and breath sounds normal.  Abdominal: Soft. Bowel sounds are normal. There is no tenderness.  Genitourinary: Rectum normal. Rectal exam shows guaiac negative stool.  Genitourinary Comments: Right groin bulge easily reducible consistent with a inguinal hernia. Uncircumcised. No blood at the tip of the penis.  Musculoskeletal: Normal range of motion.  Neurological: He is alert and oriented to person, place, and time. No cranial nerve deficit. He exhibits normal muscle tone. Coordination normal.  Skin: Skin is warm.  Nursing note and vitals reviewed.    ED Treatments / Results  Labs (all labs ordered are listed, but only abnormal results are displayed) Labs Reviewed  BASIC METABOLIC PANEL  - Abnormal; Notable for the following:       Result Value   Creatinine, Ser 2.24 (*)    Calcium 8.8 (*)    GFR calc non Af Amer 25 (*)    GFR calc Af Amer 29 (*)    All other components within normal limits  CBC WITH DIFFERENTIAL/PLATELET - Abnormal; Notable for the following:    HCT 36.9 (*)    RDW 15.9 (*)    Platelets 125 (*)    All other components within normal limits  URINALYSIS, ROUTINE W REFLEX MICROSCOPIC (NOT AT [facility]) - Abnormal; Notable for the following:    Hgb urine dipstick LARGE (*)    All other components within normal limits  URINE MICROSCOPIC-ADD ON - Abnormal; Notable for the following:    Squamous Epithelial / LPF  0-5 (*)    Bacteria, UA RARE (*)    All other components within normal limits  URINE CULTURE    EKG  EKG Interpretation None       Radiology No results found.  Procedures Procedures (including critical care time)  Medications Ordered in ED Medications  cephALEXin (KEFLEX) capsule 500 mg (not administered)     Initial Impression / Assessment and Plan / ED Course  I have reviewed the triage vital signs and the nursing notes.  Pertinent labs & imaging results that were available during my care of the patient were reviewed by me and considered in my medical decision making (see chart for details).  Clinical Course   Patient brought in for blood that they saw yesterday on the seat of the commode. But have not seen any in his depends. Also noticed a bulge in the groin area. Examination consistent with an easily reducible right groin hernia. Patient probably not a good candidate for repair of this. Rectal negative no blood noticed at the tip of the pain is however urinalysis does show hematuria. Possible could've come from that. No obvious other wounds or bleeding sites to explain the blood. None seen currently. Patient's hemoglobin and hematocrit is stable. Patient will be treated as a urinary tract infection with hematuria patient will be  started on Keflex first dose given here. Follow-up with primary care doctor.   Final Clinical Impressions(s) / ED Diagnoses   Final diagnoses:  Acute cystitis with hematuria  Inguinal hernia of right side without obstruction or gangrene    New Prescriptions New Prescriptions   CEPHALEXIN (KEFLEX) 500 MG CAPSULE    Take 1 capsule (500 mg total) by mouth 4 (four) times daily.     Fredia Sorrow, MD 02/04/16 [phone]

## 2016-02-07 LAB — URINE CULTURE

## 2016-02-10 ENCOUNTER — Telehealth: Payer: Self-pay

## 2016-02-10 ENCOUNTER — Ambulatory Visit (INDEPENDENT_AMBULATORY_CARE_PROVIDER_SITE_OTHER): Payer: Medicare Other | Admitting: Family Medicine

## 2016-02-10 ENCOUNTER — Encounter: Payer: Self-pay | Admitting: Family Medicine

## 2016-02-10 VITALS — BP 130/78 | HR 88 | Temp 98.2°F | Resp 18 | Ht 69.0 in | Wt 197.1 lb

## 2016-02-10 DIAGNOSIS — R319 Hematuria, unspecified: Secondary | ICD-10-CM | POA: Diagnosis not present

## 2016-02-10 DIAGNOSIS — F0281 Dementia in other diseases classified elsewhere with behavioral disturbance: Secondary | ICD-10-CM | POA: Diagnosis not present

## 2016-02-10 DIAGNOSIS — G301 Alzheimer's disease with late onset: Secondary | ICD-10-CM

## 2016-02-10 DIAGNOSIS — F02818 Alzheimer's disease with late onset: Secondary | ICD-10-CM

## 2016-02-10 NOTE — Patient Instructions (Signed)
Make sure he drinks enough water Finish the antibiotic Bring back a urine sample on Monday when you come for the ppd (TB skin test)

## 2016-02-10 NOTE — Progress Notes (Signed)
Chief Complaint  Patient presents with  . Follow-up    er   Patient is here for emergency room follow-up. He was seen in the emergency room on 02/04/2016 for hematuria. Urine culture grew multiple bacteria. He was asked to come in for repeat urinalysis. He was prescribed Keflex 500 mg 4 times a day. He is taking this medicine as prescribed. He is here with one of his care aids. He has significant Alzheimer's dementia. He is cared for in the home. She reports that he has not had any fever or chills, has not had any nausea vomiting or change in appetite, has not complained of any abdominal pain. He has not had any change in his incontinence or urinary frequency. They have not noted any hematuria since his emergency room visit. He does have pre-existing incontinence, and an elevated PSA with no prior urinary tract infections. Emergency room record is reviewed as well as laboratory testing.  Patient Active Problem List   Diagnosis Date Noted  . Falls frequently 09/06/2015  . LVH (left ventricular hypertrophy) due to hypertensive disease 11/06/2014  . Acute CVA (cerebrovascular accident) ([place]) 11/06/2014  . Incontinence 11/05/2014  . Heme positive stool 10/01/2013  . Elevated PSA 04/22/2013  . Onychomycosis 03/06/2012  . Aortic stenosis 09/15/2010  . Atrial fibrillation ([place]) 08/23/2010  . Dementia with behavioral disturbance 01/10/2009  . 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 Anemia, iron deficiency 12/11/2007  . CAROTID STENOSIS 12/11/2007  . B12 deficiency 12/09/2007  . Hyperlipidemia LDL goal <100 09/11/2007  . Essential hypertension, benign 09/11/2007  . Coronary atherosclerosis of native coronary artery 09/11/2007  . DEGENERATIVE JOINT DISEASE 09/11/2007  . OSTEOARTHRITIS, KNEE, SEVERE 09/11/2007    Outpatient Encounter Prescriptions as of 02/10/2016  Medication Sig  . aspirin (ASPIRIN LOW DOSE) 81 MG EC tablet Take 81 mg by mouth daily.   . cephALEXin  (KEFLEX) 500 MG capsule Take 1 capsule (500 mg total) by mouth 4 (four) times daily.  . cetirizine (ZYRTEC) 10 MG tablet Take 10 mg by mouth daily.  Marland Kitchen diltiazem (CARDIZEM CD) 180 MG 24 hr capsule TAKE ONE CAPSULE BY MOUTH ONCE DAILY AT 2PM.  . donepezil (ARICEPT) 10 MG tablet TAKE (1) TABLET BY MOUTH AT BEDTIME.  . ergocalciferol (VITAMIN D2) 50000 units capsule Take 1 capsule (50,000 Units total) by mouth once a week. One capsule once weekly  . ferrous sulfate 325 (65 FE) MG tablet Take 325 mg by mouth daily with breakfast.  . furosemide (LASIX) 20 MG tablet TAKE ONE TABLET BY MOUTH ONCE DAILY.  Marland Kitchen glipiZIDE (GLUCOTROL XL) 2.5 MG 24 hr tablet Take 1 tablet (2.5 mg total) by mouth daily with breakfast.  . losartan (COZAAR) 25 MG tablet Take 1 tablet (25 mg total) by mouth daily.  Marland Kitchen lovastatin (MEVACOR) 40 MG tablet TAKE (1) TABLET BY MOUTH AT BEDTIME.   No facility-administered encounter medications on file as of 02/10/2016.    No Known Allergies  Review of Systems  Constitutional: Negative for activity change, appetite change, chills and fever.  Respiratory: Negative for cough and chest tightness.   Cardiovascular: Negative for chest pain and leg swelling.  Gastrointestinal: Negative for abdominal distention, abdominal pain, constipation and diarrhea.  Genitourinary: Negative for dysuria, flank pain and frequency.       Stable incontinence  Musculoskeletal: Negative for back pain.  Psychiatric/Behavioral: Positive for behavioral problems. Negative for dysphoric mood and sleep disturbance. The patient is not nervous/anxious.  Wandering  All other systems reviewed and are negative.  review of systems discussed with caregiver  BP 130/78 (BP Location: Left Arm, Patient Position: Sitting, Cuff Size: Normal)   Pulse 88   Temp 98.2 F (36.8 C) (Oral)   Resp 18   Ht 5\' 9"  (1.753 m)   Wt 197 lb 1.9 oz (89.4 kg)   SpO2 99%   BMI 29.11 kg/m   Physical Exam  Constitutional: He  appears well-developed and well-nourished. No distress.  Subdued, quiet. Appropriate answers to direct questions. Obvious memory impairment. Smells of urine, trousers are damp  HENT:  Head: Normocephalic and atraumatic.  Mouth/Throat: Oropharynx is clear and moist.  Appears well-hydrated  Neck: Normal range of motion.  Cardiovascular: Normal rate and regular rhythm.   Murmur heard. Pulmonary/Chest: Effort normal and breath sounds normal. No respiratory distress. He has no rales.  Abdominal: Soft. Bowel sounds are normal. There is no tenderness.  Lymphadenopathy:    He has no cervical adenopathy.  Neurological: He is alert.  Psychiatric:  Cooperative.    ASSESSMENT/PLAN:  1. Hematuria, unspecified type Patient unable to give [place] a urine sample today's visit. They will finish antibiotics and bring a urine sample on Monday. No evidence of kidney or urinary tract infection at this time, asymptomatic - POCT urinalysis dipstick  2. Late onset Alzheimer's disease with behavioral disturbance Needs PPD for placement in an adult daycare situation - PPD; Future   Patient Instructions  Make sure he drinks enough water Finish the antibiotic Bring back a urine sample on Monday when you come for the ppd (TB skin test)    Raylene Everts, MD

## 2016-02-10 NOTE — Telephone Encounter (Signed)
Noted that urine needs to be recollected

## 2016-02-11 ENCOUNTER — Ambulatory Visit: Payer: Medicare Other | Admitting: Family Medicine

## 2016-02-11 NOTE — Telephone Encounter (Signed)
pls order UA and micro and pt to resubmit, dx is hematuria If able to get c//s on the specimen if indicated pls also order that

## 2016-02-11 NOTE — Telephone Encounter (Signed)
Patient came in and saw Dr. Meda Coffee yesterday and patient will submit urine on 10/30

## 2016-02-14 ENCOUNTER — Other Ambulatory Visit (HOSPITAL_COMMUNITY)
Admission: RE | Admit: 2016-02-14 | Discharge: 2016-02-14 | Disposition: A | Discharge status: Home / Self Care | Payer: Medicare Other | Source: Other Acute Inpatient Hospital | Attending: Family Medicine | Admitting: Family Medicine

## 2016-02-14 ENCOUNTER — Ambulatory Visit (INDEPENDENT_AMBULATORY_CARE_PROVIDER_SITE_OTHER): Payer: Medicare Other

## 2016-02-14 DIAGNOSIS — N3001 Acute cystitis with hematuria: Secondary | ICD-10-CM

## 2016-02-14 DIAGNOSIS — Z111 Encounter for screening for respiratory tuberculosis: Secondary | ICD-10-CM | POA: Diagnosis not present

## 2016-02-14 LAB — POCT URINALYSIS DIPSTICK
BILIRUBIN UA: NEGATIVE
Glucose, UA: NEGATIVE
KETONES UA: NEGATIVE
Nitrite, UA: NEGATIVE
PH UA: 7
PROTEIN UA: 100
Spec Grav, UA: 1.02
Urobilinogen, UA: 2

## 2016-02-16 ENCOUNTER — Encounter: Payer: Self-pay | Admitting: Family Medicine

## 2016-02-16 ENCOUNTER — Other Ambulatory Visit: Payer: Self-pay | Admitting: Family Medicine

## 2016-02-16 DIAGNOSIS — R319 Hematuria, unspecified: Secondary | ICD-10-CM

## 2016-02-16 LAB — URINE CULTURE: CULTURE: NO GROWTH

## 2016-02-16 LAB — TB SKIN TEST
INDURATION: 0 mm
TB Skin Test: NEGATIVE

## 2016-02-20 ENCOUNTER — Emergency Department (HOSPITAL_COMMUNITY)
Admission: EM | Admit: 2016-02-20 | Discharge: 2016-02-20 | Disposition: A | Discharge status: Home / Self Care | Payer: Medicare Other | Attending: Emergency Medicine | Admitting: Emergency Medicine

## 2016-02-20 ENCOUNTER — Encounter (HOSPITAL_COMMUNITY): Payer: Self-pay | Admitting: Emergency Medicine

## 2016-02-20 ENCOUNTER — Emergency Department (HOSPITAL_COMMUNITY): Payer: Medicare Other

## 2016-02-20 DIAGNOSIS — Z7984 Long term (current) use of oral hypoglycemic drugs: Secondary | ICD-10-CM | POA: Diagnosis not present

## 2016-02-20 DIAGNOSIS — Y9301 Activity, walking, marching and hiking: Secondary | ICD-10-CM | POA: Diagnosis not present

## 2016-02-20 DIAGNOSIS — I2510 Atherosclerotic heart disease of native coronary artery without angina pectoris: Secondary | ICD-10-CM | POA: Insufficient documentation

## 2016-02-20 DIAGNOSIS — Y929 Unspecified place or not applicable: Secondary | ICD-10-CM | POA: Diagnosis not present

## 2016-02-20 DIAGNOSIS — S4991XA Unspecified injury of right shoulder and upper arm, initial encounter: Secondary | ICD-10-CM | POA: Diagnosis present

## 2016-02-20 DIAGNOSIS — E119 Type 2 diabetes mellitus without complications: Secondary | ICD-10-CM | POA: Diagnosis not present

## 2016-02-20 DIAGNOSIS — W010XXA Fall on same level from slipping, tripping and stumbling without subsequent striking against object, initial encounter: Secondary | ICD-10-CM | POA: Diagnosis not present

## 2016-02-20 DIAGNOSIS — Z7982 Long term (current) use of aspirin: Secondary | ICD-10-CM | POA: Insufficient documentation

## 2016-02-20 DIAGNOSIS — I10 Essential (primary) hypertension: Secondary | ICD-10-CM | POA: Insufficient documentation

## 2016-02-20 DIAGNOSIS — Z79899 Other long term (current) drug therapy: Secondary | ICD-10-CM | POA: Insufficient documentation

## 2016-02-20 DIAGNOSIS — S42201A Unspecified fracture of upper end of right humerus, initial encounter for closed fracture: Secondary | ICD-10-CM | POA: Insufficient documentation

## 2016-02-20 DIAGNOSIS — Z87891 Personal history of nicotine dependence: Secondary | ICD-10-CM | POA: Insufficient documentation

## 2016-02-20 DIAGNOSIS — S42291A Other displaced fracture of upper end of right humerus, initial encounter for closed fracture: Secondary | ICD-10-CM | POA: Diagnosis not present

## 2016-02-20 DIAGNOSIS — Y999 Unspecified external cause status: Secondary | ICD-10-CM | POA: Insufficient documentation

## 2016-02-20 MED ORDER — HYDROCODONE-ACETAMINOPHEN 5-325 MG PO TABS: 1.0000 | ORAL_TABLET | Freq: Four times a day (QID) | ORAL | 0 refills | Status: DC | PRN

## 2016-02-20 NOTE — Discharge Instructions (Signed)
Wear sling and swath as applied.  Ice for 20 minutes every 2 hours while awake for the next 2 days.  Hydrocodone as prescribed as needed for pain.  Follow-up with orthopedics in the next 2-3 days. The contact information for Dr. Aline Brochure has been provided in this discharge summary for you to call and make these arrangements.

## 2016-02-20 NOTE — ED Triage Notes (Signed)
Pt's cane slipped causing him to fall and injure his R shoulder. No obvious deformity noted.

## 2016-02-20 NOTE — ED Provider Notes (Signed)
[facility] DEPT Provider Note   CSN: [id number] Arrival date & time: 02/20/16  1623     History   Chief Complaint Chief Complaint  Patient presents with  . Fall    HPI Tyrone Hall is a 82 y.o. male.  Patient is an 82 year old male who presents with complaints of right shoulder pain. He was using his cane to ambulate when it slipped out from under him and he fell forward. He denies any loss of consciousness. He is complaining of pain in the right shoulder, and denies other injury.   The history is provided by the patient.  Fall  This is a new problem. The current episode started less than 1 hour ago. The problem occurs constantly. The problem has not changed since onset.Nothing aggravates the symptoms. Nothing relieves the symptoms. He has tried nothing for the symptoms.    Past Medical History:  Diagnosis Date  . Acute biliary pancreatitis   . Anemia   . Atrial fibrillation ([place])   . B12 deficiency   . Carotid artery disease (HCC)    60-79% RICA and 123456 LICA -  [id number]  . Coronary atherosclerosis of native coronary artery    Details not clear  . Dementia   . Essential hypertension, benign   . Mixed hyperlipidemia   . OA (osteoarthritis) of knee   . Stroke [facility]) 2008  . TIA (transient ischemic attack)   . Type 2 diabetes mellitus [facility])     Patient Active Problem List   Diagnosis Date Noted  . Falls frequently 09/06/2015  . LVH (left ventricular hypertrophy) due to hypertensive disease 11/06/2014  . Acute CVA (cerebrovascular accident) ([place]) 11/06/2014  . Incontinence 11/05/2014  . Heme positive stool 10/01/2013  . Elevated PSA 04/22/2013  . Onychomycosis 03/06/2012  . Aortic stenosis 09/15/2010  . Atrial fibrillation ([place]) 08/23/2010  . Dementia with behavioral disturbance 01/10/2009  . 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 Anemia, iron deficiency 12/11/2007  . CAROTID STENOSIS 12/11/2007  . B12 deficiency 12/09/2007  .  Hyperlipidemia LDL goal <100 09/11/2007  . Essential hypertension, benign 09/11/2007  . Coronary atherosclerosis of native coronary artery 09/11/2007  . DEGENERATIVE JOINT DISEASE 09/11/2007  . OSTEOARTHRITIS, KNEE, SEVERE 09/11/2007    Past Surgical History:  Procedure Laterality Date  . CHOLECYSTECTOMY    . COLONOSCOPY    01/20/2008   [id number], normal rectum, left-sided diverticula  . Common bile duct stone removal     s/p ercp with biliary and pancreatic stent placement and subsequent removal 2005  . ESOPHAGOGASTRODUODENOSCOPY    01/20/2008   [id number] esophagus, small hiatal hernia, healing ulcer scar antrum . bx mild inflammation c/w with healed ulceration. no h.pylori, small bowel bx negative.  . ESOPHAGOGASTRODUODENOSCOPY N/A 10/29/2013   RMR: gastric erosinons of uncertain significance-status post gastric biopsy.  Marland Kitchen HEMORRHOID SURGERY         Home Medications    Prior to Admission medications   Medication Sig Start Date End Date Taking? Authorizing Provider  aspirin (ASPIRIN LOW DOSE) 81 MG EC tablet Take 81 mg by mouth daily.    Yes Historical Provider, MD  cetirizine (ZYRTEC) 10 MG tablet Take 10 mg by mouth daily.   Yes Historical Provider, MD  diltiazem (CARDIZEM CD) 180 MG 24 hr capsule TAKE ONE CAPSULE BY MOUTH ONCE DAILY AT 2PM. 12/27/15  Yes Fayrene Helper, MD  donepezil (ARICEPT) 10 MG tablet TAKE (1) TABLET BY MOUTH AT BEDTIME. 12/14/15  Yes Fayrene Helper, MD  ergocalciferol (VITAMIN D2) 50000 units capsule Take 1 capsule (50,000 Units total) by mouth once a week. One capsule once weekly 12/21/15  Yes Fayrene Helper, MD  ferrous sulfate 325 (65 FE) MG tablet Take 325 mg by mouth daily with breakfast.   Yes Historical Provider, MD  furosemide (LASIX) 20 MG tablet TAKE ONE TABLET BY MOUTH ONCE DAILY. 12/09/15  Yes Fayrene Helper, MD  glipiZIDE (GLUCOTROL XL) 2.5 MG 24 hr tablet Take 1 tablet (2.5 mg total) by mouth daily with breakfast. 12/29/15   Yes Fayrene Helper, MD  lovastatin (MEVACOR) 40 MG tablet TAKE (1) TABLET BY MOUTH AT BEDTIME. 09/07/15  Yes Fayrene Helper, MD  losartan (COZAAR) 25 MG tablet Take 1 tablet (25 mg total) by mouth daily. 12/14/15   Fayrene Helper, MD    Family History Family History  Problem Relation Age of Onset  . Hypertension Son   . Diabetes type II    . Colon cancer Neg Hx     Social History Social History  Substance Use Topics  . Smoking status: Former Smoker    Types: Cigarettes    Quit date: 05/18/1983  . Smokeless tobacco: Never Used     Comment: Quit x 50 years  . Alcohol use No     Allergies   Patient has no known allergies.   Review of Systems Review of Systems  All other systems reviewed and are negative.    Physical Exam Updated Vital Signs BP 171/88 (BP Location: Left Arm)   Pulse 92   Resp 20   Ht 5\' 9"  (1.753 m)   Wt 205 lb (93 kg)   SpO2 95%   BMI 30.27 kg/m   Physical Exam  Constitutional: He is oriented to person, place, and time. He appears well-developed and well-nourished. No distress.  HENT:  Head: Normocephalic and atraumatic.  Neck: Normal range of motion. Neck supple.  Musculoskeletal:  The right shoulder appears grossly normal. There is tenderness over the lateral aspect. Ulnar and radial pulses are easily palpable. Is able to flex, extend, and oppose all fingers without difficulty. Sensation is intact throughout the entire hand.  Neurological: He is alert and oriented to person, place, and time.  Skin: Skin is warm and dry. He is not diaphoretic.  Nursing note and vitals reviewed.    ED Treatments / Results  Labs (all labs ordered are listed, but only abnormal results are displayed) Labs Reviewed - No data to display  EKG  EKG Interpretation None       Radiology Dg Shoulder Right  Result Date: 02/20/2016 CLINICAL DATA:  [place] today and injured right shoulder. EXAM: RIGHT SHOULDER - 2+ VIEW COMPARISON:  None. FINDINGS:  Impacted and slightly displaced humeral head and neck fractures. CT may be helpful for further evaluation. No dislocation. AC joint degenerative changes are noted. The right lung is grossly clear. IMPRESSION: Right humeral head and neck fractures. Electronically Signed   By: Marijo Sanes M.D.   On: 02/20/2016 17:49    Procedures Procedures (including critical care time)  Medications Ordered in ED Medications - No data to display   Initial Impression / Assessment and Plan / ED Course  I have reviewed the triage vital signs and the nursing notes.  Pertinent labs & imaging results that were available during my care of the patient were reviewed by me and considered in my medical decision making (see chart for details).  Clinical Course     X-rays reveal a  fracture of the proximal humerus. He will be placed in a sling and swath and will be advised to follow-up with orthopedics later this week.  Final Clinical Impressions(s) / ED Diagnoses   Final diagnoses:  None    New Prescriptions New Prescriptions   No medications on file     Veryl Speak, MD 02/20/16 [phone]

## 2016-02-23 ENCOUNTER — Ambulatory Visit (INDEPENDENT_AMBULATORY_CARE_PROVIDER_SITE_OTHER): Payer: Medicare Other | Admitting: Orthopaedic Surgery

## 2016-02-23 ENCOUNTER — Encounter: Payer: Self-pay | Admitting: Orthopaedic Surgery

## 2016-02-23 VITALS — BP 124/80 | HR 77 | Temp 98.2°F

## 2016-02-23 DIAGNOSIS — G301 Alzheimer's disease with late onset: Secondary | ICD-10-CM | POA: Diagnosis not present

## 2016-02-23 DIAGNOSIS — F0280 Dementia in other diseases classified elsewhere without behavioral disturbance: Secondary | ICD-10-CM

## 2016-02-23 DIAGNOSIS — S42294A Other nondisplaced fracture of upper end of right humerus, initial encounter for closed fracture: Secondary | ICD-10-CM | POA: Diagnosis not present

## 2016-02-23 NOTE — Progress Notes (Signed)
Subjective: He fell and hurt his right shoulder    Patient ID: Tyrone Hall, male    DOB: 04-17-1931, 82 y.o.   MRN: [id number]  HPI He has dementia.  He lives at home.  He fell and hurt his right shoulder on 02-20-16.  He was seen in the ER.  X-rays show: IMPRESSION: Right humeral head and neck fractures.  He was given a sling.  He keeps trying to remove it.  His daughter takes care of him at home and I went over proper use of the sling.  She is the source of the history.  He has no other injury.   Review of Systems  HENT: Negative for congestion.   Respiratory: Negative for cough and shortness of breath.   Cardiovascular: Negative for chest pain and leg swelling.  Endocrine: Positive for cold intolerance.  Musculoskeletal: Positive for arthralgias and joint swelling.  Allergic/Immunologic: Positive for environmental allergies.  Neurological: Positive for speech difficulty.   Past Medical History:  Diagnosis Date  . Acute biliary pancreatitis   . Anemia   . Atrial fibrillation ([place])   . B12 deficiency   . Carotid artery disease (HCC)    60-79% RICA and 123456 LICA -  99991111  . Coronary atherosclerosis of native coronary artery    Details not clear  . Dementia   . Essential hypertension, benign   . Mixed hyperlipidemia   . OA (osteoarthritis) of knee   . Stroke [facility]) 2008  . TIA (transient ischemic attack)   . Type 2 diabetes mellitus ([place])     Past Surgical History:  Procedure Laterality Date  . CHOLECYSTECTOMY    . COLONOSCOPY    01/20/2008   [id number], normal rectum, left-sided diverticula  . Common bile duct stone removal     s/p ercp with biliary and pancreatic stent placement and subsequent removal 2005  . ESOPHAGOGASTRODUODENOSCOPY    01/20/2008   [id number] esophagus, small hiatal hernia, healing ulcer scar antrum . bx mild inflammation c/w with healed ulceration. no h.pylori, small bowel bx negative.  . ESOPHAGOGASTRODUODENOSCOPY N/A 10/29/2013   RMR:  gastric erosinons of uncertain significance-status post gastric biopsy.  Marland Kitchen HEMORRHOID SURGERY      Current Outpatient Prescriptions on File Prior to Visit  Medication Sig Dispense Refill  . aspirin (ASPIRIN LOW DOSE) 81 MG EC tablet Take 81 mg by mouth daily.     . cetirizine (ZYRTEC) 10 MG tablet Take 10 mg by mouth daily.    Marland Kitchen diltiazem (CARDIZEM CD) 180 MG 24 hr capsule TAKE ONE CAPSULE BY MOUTH ONCE DAILY AT 2PM. 30 capsule 3  . donepezil (ARICEPT) 10 MG tablet TAKE (1) TABLET BY MOUTH AT BEDTIME. 30 tablet 3  . ergocalciferol (VITAMIN D2) 50000 units capsule Take 1 capsule (50,000 Units total) by mouth once a week. One capsule once weekly 12 capsule 1  . ferrous sulfate 325 (65 FE) MG tablet Take 325 mg by mouth daily with breakfast.    . furosemide (LASIX) 20 MG tablet TAKE ONE TABLET BY MOUTH ONCE DAILY. 30 tablet 2  . glipiZIDE (GLUCOTROL XL) 2.5 MG 24 hr tablet Take 1 tablet (2.5 mg total) by mouth daily with breakfast. 30 tablet 3  . HYDROcodone-acetaminophen (NORCO) 5-325 MG tablet Take 1-2 tablets by mouth every 6 (six) hours as needed. 25 tablet 0  . losartan (COZAAR) 25 MG tablet Take 1 tablet (25 mg total) by mouth daily. 30 tablet 3  . lovastatin (MEVACOR) 40 MG tablet TAKE (1)  TABLET BY MOUTH AT BEDTIME. 30 tablet 3   No current facility-administered medications on file prior to visit.     Social History   Social History  . Marital status: Divorced    Spouse name: N/A  . Number of children: 2  . Years of education: N/A   Occupational History  . Retired   .  Retired   Social History Main Topics  . Smoking status: Former Smoker    Types: Cigarettes    Quit date: 05/18/1983  . Smokeless tobacco: Never Used     Comment: Quit x 50 years  . Alcohol use No  . Drug use: No  . Sexual activity: Not on file   Other Topics Concern  . Not on file   Social History Narrative  . No narrative on file    Family History  Problem Relation Age of Onset  . Hypertension  Son   . Diabetes type II    . Colon cancer Neg Hx     BP 124/80   Pulse 77   Temp 98.2 F (36.8 C)       Objective:   Physical Exam  Constitutional: He is oriented to person, place, and time. He appears well-developed and well-nourished.  He is disoriented to time, place and person.   HENT:  Head: Normocephalic and atraumatic.  Eyes: Conjunctivae and EOM are normal. Pupils are equal, round, and reactive to light.  Neck: Normal range of motion. Neck supple.  Cardiovascular: Normal rate, regular rhythm and intact distal pulses.   Pulmonary/Chest: Effort normal.  Abdominal: Soft.  Musculoskeletal: He exhibits tenderness (The right shoulder is tender.  He has ecchymosis of the right upper arm and elbow, motion decreased.  NV itntact.  Left shoulder negative, neck negative.).  Neurological: He is alert and oriented to person, place, and time. He has normal reflexes. No cranial nerve deficit. He exhibits normal muscle tone. Coordination normal.  Skin: Skin is warm and dry.  Psychiatric: He has a normal mood and affect. His behavior is normal. Judgment and thought content normal.          Assessment & Plan:   Encounter Diagnoses  Name Primary?  . Other closed nondisplaced fracture of proximal end of right humerus, initial encounter Yes  . Late onset Alzheimer's disease without behavioral disturbance    Return in two weeks with x-rays then of the right shoulder.  He is going to the [employer] here in town during the day.  A Rx for a wheelchair was given.  Call if any problem.  Electronically Signed Sanjuana Kava, MD 11/8/201710:48 AM

## 2016-03-03 ENCOUNTER — Other Ambulatory Visit: Payer: Self-pay | Admitting: Family Medicine

## 2016-03-08 ENCOUNTER — Ambulatory Visit (INDEPENDENT_AMBULATORY_CARE_PROVIDER_SITE_OTHER): Payer: Medicare Other | Admitting: Orthopaedic Surgery

## 2016-03-08 ENCOUNTER — Ambulatory Visit (INDEPENDENT_AMBULATORY_CARE_PROVIDER_SITE_OTHER): Payer: Medicare Other

## 2016-03-08 DIAGNOSIS — F0280 Dementia in other diseases classified elsewhere without behavioral disturbance: Secondary | ICD-10-CM

## 2016-03-08 DIAGNOSIS — S42294G Other nondisplaced fracture of upper end of right humerus, subsequent encounter for fracture with delayed healing: Secondary | ICD-10-CM

## 2016-03-08 DIAGNOSIS — G301 Alzheimer's disease with late onset: Secondary | ICD-10-CM

## 2016-03-08 NOTE — Progress Notes (Signed)
CC: my shoulder is sore  He has fracture of right proximal humerus.  He is using his sling.  He has no new trauma.  NV intact.  He has limited motion of the right shoulder.  X-rays were done, reported separately.  Encounter Diagnoses  Name Primary?  . Other closed nondisplaced fracture of proximal end of right humerus with delayed healing, subsequent encounter Yes  . Late onset Alzheimer's disease without behavioral disturbance    Return in two weeks.  X-rays on return.  Precautions discussed with his family.  Call if any problem.  Electronically Signed Sanjuana Kava, MD 11/22/201711:17 AM

## 2016-03-22 ENCOUNTER — Ambulatory Visit (INDEPENDENT_AMBULATORY_CARE_PROVIDER_SITE_OTHER): Payer: Self-pay | Admitting: Orthopaedic Surgery

## 2016-03-22 ENCOUNTER — Ambulatory Visit (INDEPENDENT_AMBULATORY_CARE_PROVIDER_SITE_OTHER): Payer: Medicare Other

## 2016-03-22 VITALS — BP 178/97 | HR 71 | Temp 97.9°F | Ht 69.0 in | Wt 189.0 lb

## 2016-03-22 DIAGNOSIS — F0280 Dementia in other diseases classified elsewhere without behavioral disturbance: Secondary | ICD-10-CM

## 2016-03-22 DIAGNOSIS — S42294G Other nondisplaced fracture of upper end of right humerus, subsequent encounter for fracture with delayed healing: Secondary | ICD-10-CM | POA: Diagnosis not present

## 2016-03-22 DIAGNOSIS — G301 Alzheimer's disease with late onset: Secondary | ICD-10-CM

## 2016-03-22 NOTE — Progress Notes (Signed)
CC:  My shoulder is better  He has less pain of the right shoulder.  He is putting on his clothes now without help.  NV is intact.  ROM is decreased but better.  I will begin PT/OT.  Encounter Diagnoses  Name Primary?  . Other closed nondisplaced fracture of proximal end of right humerus with delayed healing, subsequent encounter Yes  . Late onset Alzheimer's disease without behavioral disturbance    X-rays were done, reported separately.  Return in one month  Begin OT.  Call if any problem.  Electronically Signed Sanjuana Kava, MD 12/6/20179:57 AM

## 2016-03-28 ENCOUNTER — Ambulatory Visit (INDEPENDENT_AMBULATORY_CARE_PROVIDER_SITE_OTHER): Payer: Medicare Other | Admitting: Family Medicine

## 2016-03-28 ENCOUNTER — Encounter: Payer: Self-pay | Admitting: Family Medicine

## 2016-03-28 ENCOUNTER — Ambulatory Visit (HOSPITAL_COMMUNITY): Payer: Medicare Other

## 2016-03-28 ENCOUNTER — Ambulatory Visit (INDEPENDENT_AMBULATORY_CARE_PROVIDER_SITE_OTHER): Payer: Medicare Other | Admitting: Urology

## 2016-03-28 VITALS — BP 124/84 | HR 98 | Resp 16 | Ht 69.0 in | Wt 191.0 lb

## 2016-03-28 DIAGNOSIS — E559 Vitamin D deficiency, unspecified: Secondary | ICD-10-CM | POA: Diagnosis not present

## 2016-03-28 DIAGNOSIS — C678 Malignant neoplasm of overlapping sites of bladder: Secondary | ICD-10-CM

## 2016-03-28 DIAGNOSIS — E538 Deficiency of other specified B group vitamins: Secondary | ICD-10-CM | POA: Diagnosis not present

## 2016-03-28 DIAGNOSIS — R310 Gross hematuria: Secondary | ICD-10-CM

## 2016-03-28 DIAGNOSIS — R972 Elevated prostate specific antigen [PSA]: Secondary | ICD-10-CM

## 2016-03-28 DIAGNOSIS — E1121 Type 2 diabetes mellitus with diabetic nephropathy: Secondary | ICD-10-CM | POA: Diagnosis not present

## 2016-03-28 DIAGNOSIS — G308 Other Alzheimer's disease: Secondary | ICD-10-CM

## 2016-03-28 DIAGNOSIS — E785 Hyperlipidemia, unspecified: Secondary | ICD-10-CM

## 2016-03-28 DIAGNOSIS — G309 Alzheimer's disease, unspecified: Secondary | ICD-10-CM

## 2016-03-28 DIAGNOSIS — R319 Hematuria, unspecified: Secondary | ICD-10-CM | POA: Insufficient documentation

## 2016-03-28 DIAGNOSIS — F05 Delirium due to known physiological condition: Secondary | ICD-10-CM | POA: Insufficient documentation

## 2016-03-28 DIAGNOSIS — M7989 Other specified soft tissue disorders: Secondary | ICD-10-CM | POA: Diagnosis not present

## 2016-03-28 DIAGNOSIS — I10 Essential (primary) hypertension: Secondary | ICD-10-CM

## 2016-03-28 DIAGNOSIS — F0281 Dementia in other diseases classified elsewhere with behavioral disturbance: Secondary | ICD-10-CM

## 2016-03-28 MED ORDER — MELATONIN 1 MG PO CAPS: 1.0000 | ORAL_CAPSULE | Freq: Every day | ORAL | 3 refills | Status: AC

## 2016-03-28 MED ORDER — FUROSEMIDE 20 MG PO TABS: 20.0000 mg | ORAL_TABLET | Freq: Every day | ORAL | 5 refills | Status: AC

## 2016-03-28 MED ORDER — CYANOCOBALAMIN 1000 MCG/ML IJ SOLN
1000.0000 ug | Freq: Once | INTRAMUSCULAR | Status: AC
  Administered 2016-03-28: 1000 ug via INTRAMUSCULAR

## 2016-03-28 NOTE — Patient Instructions (Addendum)
F/U in 3 months, call if you need me before  Wellness visit Jan 31 or after  B12 in office today  HBa1C, chem 7 and EGFr and vit D level today  New additional medication for sundowning/ evening agitation, melatonin one at bedtime   HAPPY and THANKFUL you are at the center and doing well  Thank you  for choosing [facility]. We consider it a privelige to serve you.  Delivering excellent health care in a caring and  compassionate way is our goal.  Partnering with you,  so that together we can achieve this goal is our strategy.

## 2016-03-28 NOTE — Assessment & Plan Note (Signed)
Sundowning noted, recommend melatonin low dose, no conving evidence that medication is beneficial. Currently in adult daycare x 3 weeks and settled in well

## 2016-03-28 NOTE — Assessment & Plan Note (Signed)
New onset gross hematuria, has urology eval this pm

## 2016-03-28 NOTE — Assessment & Plan Note (Signed)
Hyperlipidemia:Low fat diet discussed and encouraged. Controlled, no change in medication    Lipid Panel  Lab Results  Component Value Date   CHOL 165 12/14/2015   HDL 39 (L) 12/14/2015   LDLCALC 102 12/14/2015   TRIG 118 12/14/2015   CHOLHDL 4.2 12/14/2015

## 2016-03-28 NOTE — Progress Notes (Signed)
Tyrone Hall     MRN: [id number]      DOB: August 10, 1930   HPI Tyrone Hall is here for follow up and re-evaluation of chronic medical conditions, medication management and review of any available recent lab and radiology data.  Preventive health is updated, specifically  Cancer screening and Immunization.   Questions or concerns regarding consultations or procedures which the PT has had in the interim are  Addressed.has appt today for recurrent hematuria. Has oT next week for right shoulder pain following fall, ortho managing that c/o increased agitation at night with wandering, sundowning, requests medication for this   ROS History from caregiver , pt incapable Denies recent fever or chills. Denies sinus pressure, nasal congestion, ear pain or sore throat. Denies chest congestion, productive cough or wheezing. Denies chest pains, palpitations and leg swelling Denies abdominal pain, nausea, vomiting,diarrhea or constipation.    Denies headaches, seizures, numbness, or tingling.  Denies skin break down or rash.   PE  BP 124/84   Pulse 98   Resp 16   Ht 5\' 9"  (1.753 m)   Wt 191 lb (86.6 kg)   SpO2 99%   BMI 28.21 kg/m   Patient alert a, disoriented and in no cardiopulmonary distress.  HEENT: No facial asymmetry, EOMI,   oropharynx pink and moist.  Neck decreased though adequate ROM no JVD, no mass.  Chest: Clear to auscultation bilaterally.  CVS: S1, S2 systolic murmur, no S3.IrRegular rate.  ABD: Soft non tender.   Ext: No edema  MS: decreased  ROM spine, shoulders, hips and knees.  Skin: Intact, no ulcerations or rash noted.  Psych: Good eye contact, blunted  affect. Memory loss , severe not anxious or depressed appearing.  CNS: CN 2-12 intact, power,  normal throughout.no focal deficits noted.   Assessment & Plan  Essential hypertension, benign Controlled, no change in medication   Diabetes mellitus with kidney disease Updated lab needed at/ before next  visit. Tyrone Hall is reminded of the importance of commitment to daily physical activity for 30 minutes or more, as able and the need to limit carbohydrate intake to 30 to 60 grams per meal to help with blood sugar control.   The need to take medication as prescribed, test blood sugar as directed, and to call between visits if there is a concern that blood sugar is uncontrolled is also discussed.   Tyrone Hall is reminded of the importance of daily foot exam, annual eye examination, and good blood sugar, blood pressure and cholesterol control.  Diabetic Labs Latest Ref Rng & Units 02/04/2016 01/24/2016 12/14/2015 09/06/2015 05/17/2015  HbA1c <5.7 % - - 7.4(H) 7.4(H) 6.8(H)  Microalbumin Not estab mg/dL - - 21.4 - -  Micro/Creat Ratio <30 mcg/mg creat - - 113(H) - -  Chol 125 - 200 mg/dL - - 165 - 189  HDL >=40 mg/dL - - 39(L) - 43  Calc LDL <130 mg/dL - - 102 - 125  Triglycerides <150 mg/dL - - 118 - 107  Creatinine 0.61 - 1.24 mg/dL 2.24(H) 2.25(H) 1.92(H) 1.40(H) 1.16(H)   BP/Weight 03/28/2016 03/22/2016 02/23/2016 02/20/2016 02/10/2016 02/04/2016 [id number]  Systolic BP A999333 0000000 A999333 [id number]  Diastolic BP 84 97 80 95 78 110 80  Wt. (Lbs) 191 189 - 205 197.12 190 191  BMI 28.21 27.91 - 30.27 29.11 28.06 29.91   Foot/eye exam completion dates Latest Ref Rng & Units 10/07/2014 07/23/2013  Eye Exam No Retinopathy - -  Foot Form  Completion - Done Done        B12 deficiency 1 cc B12 IM at office  Hyperlipidemia LDL goal <100 Hyperlipidemia:Low fat diet discussed and encouraged. Controlled, no change in medication    Lipid Panel  Lab Results  Component Value Date   CHOL 165 12/14/2015   HDL 39 (L) 12/14/2015   LDLCALC 102 12/14/2015   TRIG 118 12/14/2015   CHOLHDL 4.2 12/14/2015       Dementia with behavioral disturbance Sundowning noted, recommend melatonin low dose, no conving evidence that medication is beneficial. Currently in adult daycare x 3 weeks and settled in  well  Hematuria New onset gross hematuria, has urology eval this pm  Sundowning Trial of low dose melatonin to be started

## 2016-03-28 NOTE — Assessment & Plan Note (Signed)
Controlled, no change in medication  

## 2016-03-28 NOTE — Assessment & Plan Note (Signed)
Updated lab needed at/ before next visit. Tyrone Hall is reminded of the importance of commitment to daily physical activity for 30 minutes or more, as able and the need to limit carbohydrate intake to 30 to 60 grams per meal to help with blood sugar control.   The need to take medication as prescribed, test blood sugar as directed, and to call between visits if there is a concern that blood sugar is uncontrolled is also discussed.   Tyrone Hall is reminded of the importance of daily foot exam, annual eye examination, and good blood sugar, blood pressure and cholesterol control.  Diabetic Labs Latest Ref Rng & Units 02/04/2016 01/24/2016 12/14/2015 09/06/2015 05/17/2015  HbA1c <5.7 % - - 7.4(H) 7.4(H) 6.8(H)  Microalbumin Not estab mg/dL - - 21.4 - -  Micro/Creat Ratio <30 mcg/mg creat - - 113(H) - -  Chol 125 - 200 mg/dL - - 165 - 189  HDL >=40 mg/dL - - 39(L) - 43  Calc LDL <130 mg/dL - - 102 - 125  Triglycerides <150 mg/dL - - 118 - 107  Creatinine 0.61 - 1.24 mg/dL 2.24(H) 2.25(H) 1.92(H) 1.40(H) 1.16(H)   BP/Weight 03/28/2016 03/22/2016 02/23/2016 02/20/2016 02/10/2016 02/04/2016 [id number]  Systolic BP A999333 0000000 A999333 Q000111Q [id number]  Diastolic BP 84 97 80 95 78 110 80  Wt. (Lbs) 191 189 - 205 197.12 190 191  BMI 28.21 27.91 - 30.27 29.11 28.06 29.91   Foot/eye exam completion dates Latest Ref Rng & Units 10/07/2014 07/23/2013  Eye Exam No Retinopathy - -  Foot Form Completion - Done Done

## 2016-03-28 NOTE — Assessment & Plan Note (Signed)
Trial of low dose melatonin to be started

## 2016-03-28 NOTE — Assessment & Plan Note (Signed)
1 cc B12 IM at office

## 2016-03-29 ENCOUNTER — Other Ambulatory Visit: Payer: Self-pay | Admitting: Family Medicine

## 2016-03-29 LAB — BASIC METABOLIC PANEL WITH GFR
BUN: 24 mg/dL (ref 7–25)
CO2: 26 mmol/L (ref 20–31)
CREATININE: 2.33 mg/dL — AB (ref 0.70–1.11)
Calcium: 8.6 mg/dL (ref 8.6–10.3)
Chloride: 106 mmol/L (ref 98–110)
GFR, EST AFRICAN AMERICAN: 29 mL/min — AB (ref >=60)
GFR, Est Non African American: 25 mL/min — ABNORMAL LOW (ref >=60)
Glucose, Bld: 125 mg/dL — ABNORMAL HIGH (ref 65–99)
Potassium: 4.2 mmol/L (ref 3.5–5.3)
Sodium: 140 mmol/L (ref 135–146)

## 2016-03-29 LAB — VITAMIN D 25 HYDROXY (VIT D DEFICIENCY, FRACTURES): VIT D 25 HYDROXY: 60 ng/mL (ref 30–100)

## 2016-03-29 LAB — HEMOGLOBIN A1C
HEMOGLOBIN A1C: 6.3 % — AB (ref <5.7)
MEAN PLASMA GLUCOSE: 134 mg/dL

## 2016-03-30 ENCOUNTER — Telehealth: Payer: Self-pay

## 2016-03-30 NOTE — Telephone Encounter (Signed)
Note received , yes, pls reprint I know suspected bladder  Mass and pt was uncooperative during exam, not sure what daughter wants to know and maybe specifics are better discussed withn urology, difficult case due  To severe dementia in Dad, and she likely will need to spk directly with urologist

## 2016-03-30 NOTE — Telephone Encounter (Signed)
Spoke directly with daughter, advised that a tumor was seen , not sure if cancer, more testing needed, and she needs ot speak directly with trearting mD, urologist , [place]

## 2016-03-30 NOTE — Telephone Encounter (Signed)
Ov note from urology available for review.

## 2016-03-31 ENCOUNTER — Other Ambulatory Visit: Payer: Self-pay | Admitting: Urology

## 2016-03-31 DIAGNOSIS — C678 Malignant neoplasm of overlapping sites of bladder: Secondary | ICD-10-CM

## 2016-04-03 ENCOUNTER — Telehealth (HOSPITAL_COMMUNITY): Payer: Self-pay | Admitting: Family Medicine

## 2016-04-03 ENCOUNTER — Ambulatory Visit (HOSPITAL_COMMUNITY): Payer: Medicare Other | Attending: Orthopaedic Surgery

## 2016-04-03 NOTE — Telephone Encounter (Signed)
Patient was late for his appointmetand had to reschedule.

## 2016-04-07 ENCOUNTER — Other Ambulatory Visit: Payer: Self-pay | Admitting: Family Medicine

## 2016-04-16 ENCOUNTER — Encounter (HOSPITAL_COMMUNITY): Payer: Self-pay | Admitting: Emergency Medicine

## 2016-04-16 ENCOUNTER — Emergency Department (HOSPITAL_COMMUNITY)
Admission: EM | Admit: 2016-04-16 | Discharge: 2016-04-16 | Disposition: A | Discharge status: Home / Self Care | Payer: Medicare Other | Source: Home / Self Care | Attending: Emergency Medicine | Admitting: Emergency Medicine

## 2016-04-16 ENCOUNTER — Inpatient Hospital Stay (HOSPITAL_COMMUNITY)
Admission: EM | Admit: 2016-04-16 | Discharge: 2016-04-20 | DRG: 690 | Disposition: A | Discharge status: Home / Self Care | Payer: Medicare Other | Attending: Family Medicine | Admitting: Family Medicine

## 2016-04-16 DIAGNOSIS — R319 Hematuria, unspecified: Secondary | ICD-10-CM | POA: Insufficient documentation

## 2016-04-16 DIAGNOSIS — Z9049 Acquired absence of other specified parts of digestive tract: Secondary | ICD-10-CM

## 2016-04-16 DIAGNOSIS — C679 Malignant neoplasm of bladder, unspecified: Secondary | ICD-10-CM | POA: Diagnosis not present

## 2016-04-16 DIAGNOSIS — N390 Urinary tract infection, site not specified: Secondary | ICD-10-CM | POA: Diagnosis present

## 2016-04-16 DIAGNOSIS — D72829 Elevated white blood cell count, unspecified: Secondary | ICD-10-CM

## 2016-04-16 DIAGNOSIS — I129 Hypertensive chronic kidney disease with stage 1 through stage 4 chronic kidney disease, or unspecified chronic kidney disease: Secondary | ICD-10-CM | POA: Diagnosis present

## 2016-04-16 DIAGNOSIS — I2510 Atherosclerotic heart disease of native coronary artery without angina pectoris: Secondary | ICD-10-CM | POA: Insufficient documentation

## 2016-04-16 DIAGNOSIS — R404 Transient alteration of awareness: Secondary | ICD-10-CM | POA: Diagnosis not present

## 2016-04-16 DIAGNOSIS — E1121 Type 2 diabetes mellitus with diabetic nephropathy: Secondary | ICD-10-CM | POA: Diagnosis present

## 2016-04-16 DIAGNOSIS — Z9889 Other specified postprocedural states: Secondary | ICD-10-CM

## 2016-04-16 DIAGNOSIS — F0390 Unspecified dementia without behavioral disturbance: Secondary | ICD-10-CM | POA: Diagnosis present

## 2016-04-16 DIAGNOSIS — Z87891 Personal history of nicotine dependence: Secondary | ICD-10-CM

## 2016-04-16 DIAGNOSIS — Z8249 Family history of ischemic heart disease and other diseases of the circulatory system: Secondary | ICD-10-CM

## 2016-04-16 DIAGNOSIS — R1900 Intra-abdominal and pelvic swelling, mass and lump, unspecified site: Secondary | ICD-10-CM | POA: Diagnosis present

## 2016-04-16 DIAGNOSIS — T83498A Other mechanical complication of other prosthetic devices, implants and grafts of genital tract, initial encounter: Secondary | ICD-10-CM | POA: Diagnosis not present

## 2016-04-16 DIAGNOSIS — E1122 Type 2 diabetes mellitus with diabetic chronic kidney disease: Secondary | ICD-10-CM | POA: Diagnosis present

## 2016-04-16 DIAGNOSIS — Z8673 Personal history of transient ischemic attack (TIA), and cerebral infarction without residual deficits: Secondary | ICD-10-CM | POA: Diagnosis not present

## 2016-04-16 DIAGNOSIS — R339 Retention of urine, unspecified: Secondary | ICD-10-CM

## 2016-04-16 DIAGNOSIS — N029 Recurrent and persistent hematuria with unspecified morphologic changes: Secondary | ICD-10-CM | POA: Diagnosis not present

## 2016-04-16 DIAGNOSIS — N3001 Acute cystitis with hematuria: Secondary | ICD-10-CM | POA: Diagnosis not present

## 2016-04-16 DIAGNOSIS — Z833 Family history of diabetes mellitus: Secondary | ICD-10-CM

## 2016-04-16 DIAGNOSIS — K469 Unspecified abdominal hernia without obstruction or gangrene: Secondary | ICD-10-CM

## 2016-04-16 DIAGNOSIS — N189 Chronic kidney disease, unspecified: Secondary | ICD-10-CM

## 2016-04-16 DIAGNOSIS — D509 Iron deficiency anemia, unspecified: Secondary | ICD-10-CM | POA: Diagnosis present

## 2016-04-16 DIAGNOSIS — N501 Vascular disorders of male genital organs: Secondary | ICD-10-CM | POA: Diagnosis not present

## 2016-04-16 DIAGNOSIS — N183 Chronic kidney disease, stage 3 (moderate): Secondary | ICD-10-CM | POA: Diagnosis present

## 2016-04-16 DIAGNOSIS — Z79899 Other long term (current) drug therapy: Secondary | ICD-10-CM

## 2016-04-16 DIAGNOSIS — T82898A Other specified complication of vascular prosthetic devices, implants and grafts, initial encounter: Secondary | ICD-10-CM | POA: Diagnosis not present

## 2016-04-16 LAB — URINALYSIS, MICROSCOPIC (REFLEX)

## 2016-04-16 LAB — URINALYSIS, ROUTINE W REFLEX MICROSCOPIC
GLUCOSE, UA: 250 mg/dL — AB
KETONES UR: 40 mg/dL — AB
Nitrite: POSITIVE — AB
PH: 7 (ref 5.0–8.0)
Protein, ur: >300 mg/dL — AB
Specific Gravity, Urine: 1.01 (ref 1.005–1.030)

## 2016-04-16 LAB — BASIC METABOLIC PANEL
ANION GAP: 9 (ref 5–15)
BUN: 32 mg/dL — ABNORMAL HIGH (ref 6–20)
CALCIUM: 8.9 mg/dL (ref 8.9–10.3)
CO2: 23 mmol/L (ref 22–32)
Chloride: 110 mmol/L (ref 101–111)
Creatinine, Ser: 2.35 mg/dL — ABNORMAL HIGH (ref 0.61–1.24)
GFR calc Af Amer: 28 mL/min — ABNORMAL LOW (ref >60)
GFR calc non Af Amer: 24 mL/min — ABNORMAL LOW (ref >60)
GLUCOSE: 179 mg/dL — AB (ref 65–99)
Potassium: 4.4 mmol/L (ref 3.5–5.1)
Sodium: 142 mmol/L (ref 135–145)

## 2016-04-16 LAB — CBC
HEMATOCRIT: 33.6 % — AB (ref 39.0–52.0)
Hemoglobin: 11.5 g/dL — ABNORMAL LOW (ref 13.0–17.0)
MCH: 27.8 pg (ref 26.0–34.0)
MCHC: 34.2 g/dL (ref 30.0–36.0)
MCV: 81.2 fL (ref 78.0–100.0)
Platelets: 167 10*3/uL (ref 150–400)
RBC: 4.14 MIL/uL — ABNORMAL LOW (ref 4.22–5.81)
RDW: 15.2 % (ref 11.5–15.5)
WBC: 12.8 10*3/uL — AB (ref 4.0–10.5)

## 2016-04-16 NOTE — ED Notes (Signed)
Patient wandering the hallways. Patient assisted back to the room and back in the stretcher.

## 2016-04-16 NOTE — ED Triage Notes (Signed)
Pt in by ems for hematuria and blood around foley insertion site.

## 2016-04-16 NOTE — ED Provider Notes (Signed)
[place] DEPT Provider Note   CSN: [id number] Arrival date & time: 04/16/16  1014  By signing my name below, I, Sonum Patel, attest that this documentation has been prepared under the direction and in the presence of Jola Schmidt, MD. Electronically Signed: Sonum Patel, Education administrator. 04/16/16. 11:20 AM.  History   Chief Complaint Chief Complaint  Patient presents with  . Hematuria   The history is provided by a relative. No language interpreter was used.    LEVEL 5 CAVEAT: Dementia  HPI Comments: Tyrone Hall is a 82 y.o. male who presents to the Emergency Department complaining of intermittent episodes of hematuria for the past few months which typically lasts 3-4 days at a time. She states the most recent episode started yesterday. Daughter states patient has a cyst on his bladder and is currently being evaluated for this. She states in the past he has had associated blood clots but denies any with this episode. She denies any other issues at this time.   Past Medical History:  Diagnosis Date  . Acute biliary pancreatitis   . Anemia   . Atrial fibrillation ([place])   . B12 deficiency   . Carotid artery disease (HCC)    60-79% RICA and 123456 LICA -  99991111  . Coronary atherosclerosis of native coronary artery    Details not clear  . Dementia   . Essential hypertension, benign   . Mixed hyperlipidemia   . OA (osteoarthritis) of knee   . Stroke [facility]) 2008  . TIA (transient ischemic attack)   . Type 2 diabetes mellitus [facility])     Patient Active Problem List   Diagnosis Date Noted  . Hematuria 03/28/2016  . Sundowning 03/28/2016  . Falls frequently 09/06/2015  . LVH (left ventricular hypertrophy) due to hypertensive disease 11/06/2014  . Acute CVA (cerebrovascular accident) ([place]) 11/06/2014  . Incontinence 11/05/2014  . Heme positive stool 10/01/2013  . Elevated PSA 04/22/2013  . Onychomycosis 03/06/2012  . Aortic stenosis 09/15/2010  . Atrial fibrillation ([place])  08/23/2010  . Dementia with behavioral disturbance 01/10/2009  . Diabetes mellitus with kidney disease ([place]) 12/11/2007  . Anemia, iron deficiency 12/11/2007  . CAROTID STENOSIS 12/11/2007  . B12 deficiency 12/09/2007  . Hyperlipidemia LDL goal <100 09/11/2007  . Essential hypertension, benign 09/11/2007  . Coronary atherosclerosis of native coronary artery 09/11/2007  . DEGENERATIVE JOINT DISEASE 09/11/2007  . OSTEOARTHRITIS, KNEE, SEVERE 09/11/2007    Past Surgical History:  Procedure Laterality Date  . CHOLECYSTECTOMY    . COLONOSCOPY    01/20/2008   [id number], normal rectum, left-sided diverticula  . Common bile duct stone removal     s/p ercp with biliary and pancreatic stent placement and subsequent removal 2005  . ESOPHAGOGASTRODUODENOSCOPY    01/20/2008   [id number] esophagus, small hiatal hernia, healing ulcer scar antrum . bx mild inflammation c/w with healed ulceration. no h.pylori, small bowel bx negative.  . ESOPHAGOGASTRODUODENOSCOPY N/A 10/29/2013   RMR: gastric erosinons of uncertain significance-status post gastric biopsy.  Marland Kitchen HEMORRHOID SURGERY         Home Medications    Prior to Admission medications   Medication Sig Start Date End Date Taking? Authorizing Provider  cetirizine (ZYRTEC) 10 MG tablet Take 10 mg by mouth daily.   Yes Historical Provider, MD  diltiazem (CARDIZEM CD) 180 MG 24 hr capsule TAKE ONE CAPSULE BY MOUTH ONCE DAILY AT 2PM. 12/27/15  Yes Fayrene Helper, MD  donepezil (ARICEPT) 10 MG tablet TAKE (1) TABLET BY MOUTH  AT BEDTIME. 12/14/15  Yes Fayrene Helper, MD  ferrous sulfate 325 (65 FE) MG tablet Take 325 mg by mouth daily with breakfast.   Yes Historical Provider, MD  furosemide (LASIX) 20 MG tablet Take 1 tablet (20 mg total) by mouth daily. 03/28/16  Yes Fayrene Helper, MD  losartan (COZAAR) 25 MG tablet Take 1 tablet (25 mg total) by mouth daily. 12/14/15  Yes Fayrene Helper, MD  lovastatin (MEVACOR) 40 MG tablet  TAKE (1) TABLET BY MOUTH AT BEDTIME. 04/11/16  Yes Fayrene Helper, MD  Melatonin 1 MG CAPS Take 1 capsule (1 mg total) by mouth at bedtime. 03/28/16  Yes Fayrene Helper, MD    Family History Family History  Problem Relation Age of Onset  . Hypertension Son   . Diabetes type II    . Colon cancer Neg Hx     Social History Social History  Substance Use Topics  . Smoking status: Former Smoker    Types: Cigarettes    Quit date: 05/18/1983  . Smokeless tobacco: Never Used     Comment: Quit x 50 years  . Alcohol use No     Allergies   Patient has no known allergies.   Review of Systems Review of Systems  Unable to perform ROS: Dementia     Physical Exam Updated Vital Signs BP 125/82 (BP Location: Right Arm)   Pulse 77   Temp 98 F (36.7 C) (Oral)   Resp 18   Ht 5\' 9"  (1.753 m)   Wt 191 lb (86.6 kg)   SpO2 100%   BMI 28.21 kg/m   Physical Exam  Constitutional: He is oriented to person, place, and time. He appears well-developed and well-nourished.  HENT:  Head: Normocephalic and atraumatic.  Eyes: EOM are normal.  Neck: Normal range of motion.  Cardiovascular: Normal rate, regular rhythm, normal heart sounds and intact distal pulses.   Pulmonary/Chest: Effort normal and breath sounds normal. No respiratory distress.  Abdominal: Soft. He exhibits no distension. There is tenderness. A hernia is present.  Suprapubic tenderness. Palpable right inguinal hernia, reducible.   Musculoskeletal: Normal range of motion.  Neurological: He is alert and oriented to person, place, and time.  Skin: Skin is warm and dry.  Psychiatric: He has a normal mood and affect. Judgment normal.  Nursing note and vitals reviewed.    ED Treatments / Results  DIAGNOSTIC STUDIES: Oxygen Saturation is 100% on RA, normal by my interpretation.    COORDINATION OF CARE: 11:20 AM Discussed treatment plan with family at bedside and they agreed to plan.   Labs (all labs ordered are  listed, but only abnormal results are displayed) Labs Reviewed  CBC - Abnormal; Notable for the following:       Result Value   WBC 12.8 (*)    RBC 4.14 (*)    Hemoglobin 11.5 (*)    HCT 33.6 (*)    All other components within normal limits  BASIC METABOLIC PANEL - Abnormal; Notable for the following:    Glucose, Bld 179 (*)    BUN 32 (*)    Creatinine, Ser 2.35 (*)    GFR calc non Af Amer 24 (*)    GFR calc Af Amer 28 (*)    All other components within normal limits  URINALYSIS, ROUTINE W REFLEX MICROSCOPIC - Abnormal; Notable for the following:    Color, Urine RED (*)    APPearance TURBID (*)    Glucose, UA 250 (*)  Hgb urine dipstick LARGE (*)    Bilirubin Urine LARGE (*)    Ketones, ur 40 (*)    Protein, ur >300 (*)    Nitrite POSITIVE (*)    Leukocytes, UA LARGE (*)    All other components within normal limits  URINALYSIS, MICROSCOPIC (REFLEX) - Abnormal; Notable for the following:    Bacteria, UA MANY (*)    Squamous Epithelial / LPF 0-5 (*)    All other components within normal limits  URINE CULTURE    EKG  EKG Interpretation None       Radiology No results found.  Procedures Procedures (including critical care time)  Medications Ordered in ED Medications - No data to display   Initial Impression / Assessment and Plan / ED Course  I have reviewed the triage vital signs and the nursing notes.  Pertinent labs & imaging results that were available during my care of the patient were reviewed by me and considered in my medical decision making (see chart for details).  Clinical Course     Patient with acute hematuria and urinary retention.  Foley catheter placed.  Irrigated by nursing staff and irrigated to clear.  Patient is currently being worked up as an outpatient for bladder cancer and is being managed by urology.  Outpatient CT scan is pending at this time.  He's been having intermittent hematuria over the past 2 months.  He is brought to the ER  because of increasing immature area today.  He has dementia.  He's otherwise been in his normal state of mind.  Patient will need ongoing outpatient urology follow-up.  Patient's daughter instructed to return to the ER for new or worsening symptoms and was told to continue watching his urine output as he is at risk of catheter obstruction from clot  Final Clinical Impressions(s) / ED Diagnoses   Final diagnoses:  Hematuria  Hematuria, unspecified type  Urinary retention    New Prescriptions Discharge Medication List as of 04/16/2016  3:00 PM     I personally performed the services described in this documentation, which was scribed in my presence. The recorded information has been reviewed and is accurate.        Jola Schmidt, MD 04/16/16 [id number]

## 2016-04-16 NOTE — ED Notes (Signed)
Daughter states she is pt's primary caretaker due to dementia.  States pt has been having bloody urine for a few months.  Will be 3-4 days at a time, and then will stop for about 3 days.  Pt denies any complaints.  Daughter states pt is being evaluated for "a cyst on his bladder" but nobody has called to schedule CT scan.  She felt she needed to bring him in to get a clearer idea of what is occurring.

## 2016-04-16 NOTE — ED Triage Notes (Signed)
Pt daughter reports that pt has had intermittent hematuria for 2 months.  Bleeding began again yesterday, bright red in nature, no clots at this time, urinary frequency. Pt is not on blood thinners at this time.  Pt has cyst on bladder found 3 weeks ago by urologist.  Pt has dementia.

## 2016-04-16 NOTE — ED Notes (Signed)
3 way catheter inserted with dark blood returned.  Catheter had to be irrigated immediately to clear clots to allow drainage.  Unable to obtain pure urine sample.  Catheter flowing freely with bright red blood at this time.

## 2016-04-16 NOTE — ED Notes (Signed)
Bladder scan revealed 454ml of urine.  Pt attempted to use urinal with no success.  Is attempting to leave at this time.  Placed in recliner instead of bed and is more cooperative.  Daughter states he sits in one at home.

## 2016-04-17 ENCOUNTER — Encounter (HOSPITAL_COMMUNITY): Payer: Self-pay

## 2016-04-17 ENCOUNTER — Emergency Department (HOSPITAL_COMMUNITY): Payer: Medicare Other

## 2016-04-17 DIAGNOSIS — Z87891 Personal history of nicotine dependence: Secondary | ICD-10-CM | POA: Diagnosis not present

## 2016-04-17 DIAGNOSIS — Z8249 Family history of ischemic heart disease and other diseases of the circulatory system: Secondary | ICD-10-CM | POA: Diagnosis not present

## 2016-04-17 DIAGNOSIS — N3289 Other specified disorders of bladder: Secondary | ICD-10-CM | POA: Diagnosis not present

## 2016-04-17 DIAGNOSIS — R310 Gross hematuria: Secondary | ICD-10-CM | POA: Diagnosis not present

## 2016-04-17 DIAGNOSIS — D72829 Elevated white blood cell count, unspecified: Secondary | ICD-10-CM | POA: Insufficient documentation

## 2016-04-17 DIAGNOSIS — I2510 Atherosclerotic heart disease of native coronary artery without angina pectoris: Secondary | ICD-10-CM | POA: Diagnosis present

## 2016-04-17 DIAGNOSIS — N3001 Acute cystitis with hematuria: Principal | ICD-10-CM

## 2016-04-17 DIAGNOSIS — C679 Malignant neoplasm of bladder, unspecified: Secondary | ICD-10-CM | POA: Diagnosis present

## 2016-04-17 DIAGNOSIS — F0390 Unspecified dementia without behavioral disturbance: Secondary | ICD-10-CM | POA: Diagnosis present

## 2016-04-17 DIAGNOSIS — R1900 Intra-abdominal and pelvic swelling, mass and lump, unspecified site: Secondary | ICD-10-CM | POA: Diagnosis present

## 2016-04-17 DIAGNOSIS — D509 Iron deficiency anemia, unspecified: Secondary | ICD-10-CM | POA: Diagnosis not present

## 2016-04-17 DIAGNOSIS — N183 Chronic kidney disease, stage 3 (moderate): Secondary | ICD-10-CM | POA: Diagnosis present

## 2016-04-17 DIAGNOSIS — E1122 Type 2 diabetes mellitus with diabetic chronic kidney disease: Secondary | ICD-10-CM | POA: Diagnosis present

## 2016-04-17 DIAGNOSIS — N390 Urinary tract infection, site not specified: Secondary | ICD-10-CM | POA: Diagnosis present

## 2016-04-17 DIAGNOSIS — Z8673 Personal history of transient ischemic attack (TIA), and cerebral infarction without residual deficits: Secondary | ICD-10-CM | POA: Diagnosis not present

## 2016-04-17 DIAGNOSIS — N189 Chronic kidney disease, unspecified: Secondary | ICD-10-CM | POA: Diagnosis not present

## 2016-04-17 DIAGNOSIS — Z79899 Other long term (current) drug therapy: Secondary | ICD-10-CM | POA: Diagnosis not present

## 2016-04-17 DIAGNOSIS — I129 Hypertensive chronic kidney disease with stage 1 through stage 4 chronic kidney disease, or unspecified chronic kidney disease: Secondary | ICD-10-CM | POA: Diagnosis present

## 2016-04-17 DIAGNOSIS — R319 Hematuria, unspecified: Secondary | ICD-10-CM | POA: Diagnosis not present

## 2016-04-17 DIAGNOSIS — Z9049 Acquired absence of other specified parts of digestive tract: Secondary | ICD-10-CM | POA: Diagnosis not present

## 2016-04-17 DIAGNOSIS — Z833 Family history of diabetes mellitus: Secondary | ICD-10-CM | POA: Diagnosis not present

## 2016-04-17 DIAGNOSIS — Z9889 Other specified postprocedural states: Secondary | ICD-10-CM | POA: Diagnosis not present

## 2016-04-17 DIAGNOSIS — E1121 Type 2 diabetes mellitus with diabetic nephropathy: Secondary | ICD-10-CM | POA: Diagnosis not present

## 2016-04-17 DIAGNOSIS — N029 Recurrent and persistent hematuria with unspecified morphologic changes: Secondary | ICD-10-CM | POA: Diagnosis present

## 2016-04-17 LAB — BASIC METABOLIC PANEL
Anion gap: 9 (ref 5–15)
BUN: 35 mg/dL — AB (ref 6–20)
CALCIUM: 9.1 mg/dL (ref 8.9–10.3)
CO2: 24 mmol/L (ref 22–32)
CREATININE: 2.24 mg/dL — AB (ref 0.61–1.24)
Chloride: 111 mmol/L (ref 101–111)
GFR calc Af Amer: 29 mL/min — ABNORMAL LOW (ref >60)
GFR, EST NON AFRICAN AMERICAN: 25 mL/min — AB (ref >60)
GLUCOSE: 175 mg/dL — AB (ref 65–99)
Potassium: 5 mmol/L (ref 3.5–5.1)
SODIUM: 144 mmol/L (ref 135–145)

## 2016-04-17 LAB — URINALYSIS, ROUTINE W REFLEX MICROSCOPIC
BILIRUBIN URINE: NEGATIVE
GLUCOSE, UA: NEGATIVE mg/dL
Nitrite: POSITIVE — AB
Protein, ur: 100 mg/dL — AB
Specific Gravity, Urine: 1.01 (ref 1.005–1.030)
pH: 5 (ref 5.0–8.0)

## 2016-04-17 LAB — COMPREHENSIVE METABOLIC PANEL
ALBUMIN: 2.8 g/dL — AB (ref 3.5–5.0)
ALK PHOS: 76 U/L (ref 38–126)
ALT: 12 U/L — AB (ref 17–63)
AST: 12 U/L — ABNORMAL LOW (ref 15–41)
Anion gap: 8 (ref 5–15)
BILIRUBIN TOTAL: 0.8 mg/dL (ref 0.3–1.2)
BUN: 31 mg/dL — AB (ref 6–20)
CALCIUM: 8.5 mg/dL — AB (ref 8.9–10.3)
CO2: 23 mmol/L (ref 22–32)
CREATININE: 2.12 mg/dL — AB (ref 0.61–1.24)
Chloride: 110 mmol/L (ref 101–111)
GFR calc Af Amer: 31 mL/min — ABNORMAL LOW (ref >60)
GFR calc non Af Amer: 27 mL/min — ABNORMAL LOW (ref >60)
GLUCOSE: 144 mg/dL — AB (ref 65–99)
Potassium: 4.3 mmol/L (ref 3.5–5.1)
Sodium: 141 mmol/L (ref 135–145)
TOTAL PROTEIN: 6.7 g/dL (ref 6.5–8.1)

## 2016-04-17 LAB — GLUCOSE, CAPILLARY
GLUCOSE-CAPILLARY: 149 mg/dL — AB (ref 65–99)
Glucose-Capillary: 181 mg/dL — ABNORMAL HIGH (ref 65–99)
Glucose-Capillary: 145 mg/dL — ABNORMAL HIGH (ref 65–99)
Glucose-Capillary: 130 mg/dL — ABNORMAL HIGH (ref 65–99)

## 2016-04-17 LAB — CBC WITH DIFFERENTIAL/PLATELET
Basophils Absolute: 0 10*3/uL (ref 0.0–0.1)
Basophils Relative: 0 %
EOS ABS: 0.1 10*3/uL (ref 0.0–0.7)
EOS PCT: 1 %
HCT: 33.3 % — ABNORMAL LOW (ref 39.0–52.0)
Hemoglobin: 11.3 g/dL — ABNORMAL LOW (ref 13.0–17.0)
LYMPHS ABS: 2.4 10*3/uL (ref 0.7–4.0)
LYMPHS PCT: 12 %
MCH: 27.5 pg (ref 26.0–34.0)
MCHC: 33.9 g/dL (ref 30.0–36.0)
MCV: 81 fL (ref 78.0–100.0)
MONO ABS: 1.5 10*3/uL — AB (ref 0.1–1.0)
Monocytes Relative: 8 %
Neutro Abs: 15.2 10*3/uL — ABNORMAL HIGH (ref 1.7–7.7)
Neutrophils Relative %: 79 %
PLATELETS: 164 10*3/uL (ref 150–400)
RBC: 4.11 MIL/uL — ABNORMAL LOW (ref 4.22–5.81)
RDW: 15.2 % (ref 11.5–15.5)
WBC: 19.3 10*3/uL — ABNORMAL HIGH (ref 4.0–10.5)

## 2016-04-17 LAB — PROTIME-INR
INR: 1.26
Prothrombin Time: 15.9 seconds — ABNORMAL HIGH (ref 11.4–15.2)

## 2016-04-17 LAB — URINALYSIS, MICROSCOPIC (REFLEX)

## 2016-04-17 LAB — CBC
HCT: 31.7 % — ABNORMAL LOW (ref 39.0–52.0)
HEMOGLOBIN: 10.7 g/dL — AB (ref 13.0–17.0)
MCH: 27.4 pg (ref 26.0–34.0)
MCHC: 33.8 g/dL (ref 30.0–36.0)
MCV: 81.3 fL (ref 78.0–100.0)
PLATELETS: 111 10*3/uL — AB (ref 150–400)
RBC: 3.9 MIL/uL — ABNORMAL LOW (ref 4.22–5.81)
RDW: 15.3 % (ref 11.5–15.5)
WBC: 16 10*3/uL — ABNORMAL HIGH (ref 4.0–10.5)

## 2016-04-17 LAB — APTT: aPTT: 27 seconds (ref 24–36)

## 2016-04-17 MED ORDER — DONEPEZIL HCL 5 MG PO TABS
10.0000 mg | ORAL_TABLET | Freq: Every day | ORAL | Status: DC
  Administered 2016-04-17 – 2016-04-19 (×3): 10 mg via ORAL
  Filled 2016-04-17 (×3): qty 2

## 2016-04-17 MED ORDER — INSULIN ASPART 100 UNIT/ML ~~LOC~~ SOLN
0.0000 [IU] | Freq: Three times a day (TID) | SUBCUTANEOUS | Status: DC
  Administered 2016-04-17: 1 [IU] via SUBCUTANEOUS
  Administered 2016-04-17: 2 [IU] via SUBCUTANEOUS
  Administered 2016-04-17 – 2016-04-18 (×3): 1 [IU] via SUBCUTANEOUS
  Administered 2016-04-19: 2 [IU] via SUBCUTANEOUS
  Administered 2016-04-19: 1 [IU] via SUBCUTANEOUS
  Administered 2016-04-20: 2 [IU] via SUBCUTANEOUS

## 2016-04-17 MED ORDER — LORATADINE 10 MG PO TABS
10.0000 mg | ORAL_TABLET | Freq: Every day | ORAL | Status: DC
  Administered 2016-04-17 – 2016-04-20 (×4): 10 mg via ORAL
  Filled 2016-04-17 (×4): qty 1

## 2016-04-17 MED ORDER — DEXTROSE 5 % IV SOLN
1.0000 g | INTRAVENOUS | Status: DC
  Administered 2016-04-18: 1 g via INTRAVENOUS
  Filled 2016-04-17 (×2): qty 10

## 2016-04-17 MED ORDER — DEXTROSE 5 % IV SOLN
1.0000 g | INTRAVENOUS | Status: DC
  Administered 2016-04-17: 1 g via INTRAVENOUS
  Filled 2016-04-17 (×2): qty 10

## 2016-04-17 MED ORDER — INSULIN ASPART 100 UNIT/ML ~~LOC~~ SOLN: 0.0000 [IU] | Freq: Every day | SUBCUTANEOUS | Status: DC

## 2016-04-17 MED ORDER — DILTIAZEM HCL ER COATED BEADS 180 MG PO CP24
180.0000 mg | ORAL_CAPSULE | Freq: Every day | ORAL | Status: DC
  Administered 2016-04-17 – 2016-04-20 (×3): 180 mg via ORAL
  Filled 2016-04-17 (×4): qty 1

## 2016-04-17 MED ORDER — FUROSEMIDE 20 MG PO TABS
20.0000 mg | ORAL_TABLET | Freq: Every day | ORAL | Status: DC
  Administered 2016-04-17 – 2016-04-20 (×4): 20 mg via ORAL
  Filled 2016-04-17 (×4): qty 1

## 2016-04-17 MED ORDER — FERROUS SULFATE 325 (65 FE) MG PO TABS
325.0000 mg | ORAL_TABLET | Freq: Every day | ORAL | Status: DC
  Administered 2016-04-19 – 2016-04-20 (×2): 325 mg via ORAL
  Filled 2016-04-17 (×3): qty 1

## 2016-04-17 MED ORDER — DEXTROSE 5 % IV SOLN
1.0000 g | Freq: Once | INTRAVENOUS | Status: AC
  Administered 2016-04-17: 1 g via INTRAVENOUS
  Filled 2016-04-17: qty 10

## 2016-04-17 MED ORDER — SODIUM CHLORIDE 0.9 % IV BOLUS (SEPSIS)
1000.0000 mL | Freq: Once | INTRAVENOUS | Status: AC
  Administered 2016-04-17: 1000 mL via INTRAVENOUS

## 2016-04-17 MED ORDER — PRAVASTATIN SODIUM 40 MG PO TABS
40.0000 mg | ORAL_TABLET | Freq: Every day | ORAL | Status: DC
  Administered 2016-04-17 – 2016-04-19 (×2): 40 mg via ORAL
  Filled 2016-04-17 (×3): qty 1

## 2016-04-17 MED ORDER — ACETAMINOPHEN 650 MG RE SUPP: 650.0000 mg | Freq: Four times a day (QID) | RECTAL | Status: DC | PRN

## 2016-04-17 MED ORDER — LORAZEPAM 2 MG/ML IJ SOLN
0.2500 mg | INTRAMUSCULAR | Status: AC | PRN
Start: 2016-04-17 — End: 2016-04-18
  Administered 2016-04-17 – 2016-04-18 (×2): 0.25 mg via INTRAVENOUS
  Filled 2016-04-17 (×2): qty 1

## 2016-04-17 MED ORDER — ACETAMINOPHEN 325 MG PO TABS: 650.0000 mg | ORAL_TABLET | Freq: Four times a day (QID) | ORAL | Status: DC | PRN

## 2016-04-17 MED ORDER — LOSARTAN POTASSIUM 50 MG PO TABS
25.0000 mg | ORAL_TABLET | Freq: Every day | ORAL | Status: DC
  Administered 2016-04-17 – 2016-04-20 (×4): 25 mg via ORAL
  Filled 2016-04-17 (×4): qty 1

## 2016-04-17 NOTE — ED Provider Notes (Signed)
By signing my name below, I, Tyrone Hall, attest that this documentation has been prepared under the direction and in the presence of [employer], DO. Electronically Signed: Soijett Hall, ED Scribe. 04/17/16. 12:35 AM.  TIME SEEN: 12:28 AM  CHIEF COMPLAINT: hematuria  LEVEL 5 CAVEAT: DEMENTIA  HPI:  Tyrone Hall is a 83 y.o. male with a PMHx of CAD, a fib, dementia, chronic kidney disease, HTN, DM, who presents to the Emergency Department brought in by EMS complaining of intermittent hematuria onset 2-3 months.  he states that he has been told he has a "bladder cyst" they have been trying to get into see a urologist. Family notes that the pt is having associated symptoms of blood clots in his urine. He was seen in the emergency department yesterday morning for gross hematuria with clots and urinary retention because of the same. He had a Foley catheter placed at that time and had continuous bladder irrigation. His urine became clear and he was discharged home. They report that the patient has been pulling on his Foley catheter tonight and started having blood and decreased urinary output. They were unable to control bleeding at home and he was bleeding around the catheter so they brought him back to the emergency department. No known fevers, vomiting or diarrhea. He has dementia at baseline and is very agitated.  Family reports patient is not on anticoagulants or antiplatelets.  No other trauma to this area.   ROS: Level V caveat for dementia  PAST MEDICAL HISTORY/PAST SURGICAL HISTORY:  Past Medical History:  Diagnosis Date  . Acute biliary pancreatitis   . Anemia   . Atrial fibrillation ([place])   . B12 deficiency   . Carotid artery disease (HCC)    60-79% RICA and 123456 LICA -  [id number]  . Coronary atherosclerosis of native coronary artery    Details not clear  . Dementia   . Essential hypertension, benign   . Mixed hyperlipidemia   . OA (osteoarthritis) of knee   . Stroke [facility]) 2008   . TIA (transient ischemic attack)   . Type 2 diabetes mellitus (HCC)     MEDICATIONS:  Prior to Admission medications   Medication Sig Start Date End Date Taking? Authorizing Provider  cetirizine (ZYRTEC) 10 MG tablet Take 10 mg by mouth daily.    Historical Provider, MD  diltiazem (CARDIZEM CD) 180 MG 24 hr capsule TAKE ONE CAPSULE BY MOUTH ONCE DAILY AT 2PM. 12/27/15   Fayrene Helper, MD  donepezil (ARICEPT) 10 MG tablet TAKE (1) TABLET BY MOUTH AT BEDTIME. 12/14/15   Fayrene Helper, MD  ferrous sulfate 325 (65 FE) MG tablet Take 325 mg by mouth daily with breakfast.    Historical Provider, MD  furosemide (LASIX) 20 MG tablet Take 1 tablet (20 mg total) by mouth daily. 03/28/16   Fayrene Helper, MD  losartan (COZAAR) 25 MG tablet Take 1 tablet (25 mg total) by mouth daily. 12/14/15   Fayrene Helper, MD  lovastatin (MEVACOR) 40 MG tablet TAKE (1) TABLET BY MOUTH AT BEDTIME. 04/11/16   Fayrene Helper, MD  Melatonin 1 MG CAPS Take 1 capsule (1 mg total) by mouth at bedtime. 03/28/16   Fayrene Helper, MD    ALLERGIES:  No Known Allergies  SOCIAL HISTORY:  Social History  Substance Use Topics  . Smoking status: Former Smoker    Types: Cigarettes    Quit date: 05/18/1983  . Smokeless tobacco: Never Used  Comment: Quit x 50 years  . Alcohol use No    FAMILY HISTORY: Family History  Problem Relation Age of Onset  . Hypertension Son   . Diabetes type II    . Colon cancer Neg Hx     EXAM: BP 157/75 (BP Location: Right Arm)   Pulse 89   Temp 97.8 F (36.6 C) (Oral)   Resp 19   SpO2 100%  CONSTITUTIONAL: Alert but disoriented, agitated, elderly, in no significant distress HEAD: Normocephalic EYES: Conjunctivae clear, PERRL, EOMI ENT: normal nose; no rhinorrhea; moist mucous membranes NECK: Supple, no meningismus, no nuchal rigidity, no LAD  CARD: RRR but intermittently tachycardic; S1 and S2 appreciated; no murmurs, no clicks, no rubs, no  gallops RESP: Normal chest excursion without splinting or tachypnea; breath sounds clear and equal bilaterally; no wheezes, no rhonchi, no rales, no hypoxia or respiratory distress, speaking full sentences ABD/GI: Normal bowel sounds; non-distended; soft, non-tender, no rebound, no guarding, no peritoneal signs, no hepatosplenomegaly GU:  Uncircumcised male, no testicular masses or tenderness on exam, no scrotal swelling, dried blood all around the genital region, Foley catheter in place with no active bleeding, and from the urethral meatus, no discharge from the urethral meatus BACK:  The back appears normal and is non-tender to palpation, there is no CVA tenderness EXT: Normal ROM in all joints; non-tender to palpation; no edema; normal capillary refill; no cyanosis, no calf tenderness or swelling    SKIN: Normal color for age and race; warm; no rash NEURO: Moves all extremities equally PSYCH: Agitated. Grooming and personal hygiene are appropriate.  MEDICAL DECISION MAKING: Patient here with gross hematuria. Foley catheter placed yesterday morning and patient had bladder irrigation. Urine cleared and he was discharged home. Was not discharged on antibiotics. Urine culture pending. Will repeat labs, urine culture. We will attempt to irrigate his bladder.  ED PROGRESS: We have had a very difficult time irrigating patient's bladder. Had to irrigate manually and then we were able to use continuous irrigation but had no urinary output and significant retention. Because of this catheter had to be changed and he had large clots that came out and is now having urinary output again is very bloody with large clots. I feel he will need continuous irrigation. Urine obtained yesterday appears infected. We'll give IV ceftriaxone. He now has a worsening leukocytosis with left shift but no documented fever. Has chronic kidney disease with creatinine of 2.24 which is unchanged.  3:45 AM  D/w Dr. Jeffie Pollock with urology.   He agrees patient should be admitted to medicine for continuous bladder irrigation. Agrees with IV antibiotics. Recommend noncontrast CT scan of the patient's abdomen and pelvis. He will see the patient in consult. We will discuss with medicine for admission. Updated patient's son who agrees with this plan.   4:25 AM  Discussed patient's case with hospitalist, Dr. Maudie Mercury.  Recommend admission to inpatient, telemetry bed.  I will place holding orders per their request. Patient and family (if present) updated with plan. Care transferred to hospitalist service.  I reviewed all nursing notes, vitals, pertinent old records, EKGs, labs, imaging (as available).     [place], DO 04/17/16 [id number]

## 2016-04-17 NOTE — Progress Notes (Deleted)
Pt being transported down to MRI. Un-hooking IV until pt returns.

## 2016-04-17 NOTE — H&P (Signed)
TRH H&P   Patient Demographics:    Tyrone Hall, is a 83 y.o. male  MRN: [id number]   DOB - [birth year]  Admit Date - 04/16/2016  Outpatient Primary MD for the patient is Tula Nakayama, MD  Referring MD/NP/PA: Dr. Leonides Schanz  Outpatient Specialists: urology  Patient coming from: home  Chief Complaint  Patient presents with  . Hematuria      HPI:    Tyrone Hall  is a 83 y.o. male, w hx of pelvic mass dx about 2-3 weeks ago w recent cystoscopy, CKD stage3, anemia, CAD, CVA, carotid stenosis, dementia apparently presents due to pulling catheter out, and having bleeding. Denies fever, chills, flank pain, n/v, dysuria.    In ED, pt noted to have uti and leukocytosis (wbc 19.3), and mild anemia, and stable ckd.  Urology consulted by Ed, requested iv abx, and will see in am to assess hematuria.  Appreciate their input.    Review of systems:    In addition to the HPI above,  No Fever-chills, No Headache, No changes with Vision or hearing, No problems swallowing food or Liquids, No Chest pain, Cough or Shortness of Breath, No Abdominal pain, No Nausea or Vommitting, Bowel movements are regular, No Blood in stool No dysuria, No new skin rashes or bruises, No new joints pains-aches,  No new weakness, tingling, numbness in any extremity, No recent weight gain or loss, No polyuria, polydypsia or polyphagia, No significant Mental Stressors.  A full 10 point Review of Systems was done, except as stated above, all other Review of Systems were negative.   With Past History of the following :    Past Medical History:  Diagnosis Date  . Acute biliary pancreatitis   . Anemia   . Atrial fibrillation ([place])   . B12 deficiency   . Carotid artery disease (HCC)    60-79% RICA and 123456 LICA -  [id number]  . Coronary atherosclerosis of native coronary artery    Details not clear  .  Dementia   . Essential hypertension, benign   . Mixed hyperlipidemia   . OA (osteoarthritis) of knee   . Stroke [facility]) 2008  . TIA (transient ischemic attack)   . Type 2 diabetes mellitus ([place])       Past Surgical History:  Procedure Laterality Date  . CHOLECYSTECTOMY    . COLONOSCOPY    01/20/2008   [id number], normal rectum, left-sided diverticula  . Common bile duct stone removal     s/p ercp with biliary and pancreatic stent placement and subsequent removal 2005  . ESOPHAGOGASTRODUODENOSCOPY    01/20/2008   [id number] esophagus, small hiatal hernia, healing ulcer scar antrum . bx mild inflammation c/w with healed ulceration. no h.pylori, small bowel bx negative.  . ESOPHAGOGASTRODUODENOSCOPY N/A 10/29/2013   RMR: gastric erosinons of uncertain significance-status post gastric biopsy.  Marland Kitchen HEMORRHOID SURGERY  Social History:     Social History  Substance Use Topics  . Smoking status: Former Smoker    Types: Cigarettes    Quit date: 05/18/1983  . Smokeless tobacco: Never Used     Comment: Quit x 50 years  . Alcohol use No     Lives - at home  Mobility -  Able to walk by self   Family History :     Family History  Problem Relation Age of Onset  . Hypertension Son   . Diabetes type II    . Colon cancer Neg Hx       Home Medications:   Prior to Admission medications   Medication Sig Start Date End Date Taking? Authorizing Provider  cetirizine (ZYRTEC) 10 MG tablet Take 10 mg by mouth daily.    Historical Provider, MD  diltiazem (CARDIZEM CD) 180 MG 24 hr capsule TAKE ONE CAPSULE BY MOUTH ONCE DAILY AT 2PM. 12/27/15   Fayrene Helper, MD  donepezil (ARICEPT) 10 MG tablet TAKE (1) TABLET BY MOUTH AT BEDTIME. 12/14/15   Fayrene Helper, MD  ferrous sulfate 325 (65 FE) MG tablet Take 325 mg by mouth daily with breakfast.    Historical Provider, MD  furosemide (LASIX) 20 MG tablet Take 1 tablet (20 mg total) by mouth daily. 03/28/16   Fayrene Helper, MD  losartan (COZAAR) 25 MG tablet Take 1 tablet (25 mg total) by mouth daily. 12/14/15   Fayrene Helper, MD  lovastatin (MEVACOR) 40 MG tablet TAKE (1) TABLET BY MOUTH AT BEDTIME. 04/11/16   Fayrene Helper, MD  Melatonin 1 MG CAPS Take 1 capsule (1 mg total) by mouth at bedtime. 03/28/16   Fayrene Helper, MD     Allergies:    No Known Allergies   Physical Exam:   Vitals  Blood pressure 157/75, pulse 89, temperature 97.8 F (36.6 C), temperature source Oral, resp. rate 19, SpO2 100 %.   1. General  lying in bed in NAD,    2. Normal affect and insight, Not Suicidal or Homicidal, Awake Alert, Oriented X 3.  3. No F.N deficits, ALL C.Nerves Intact, Strength 5/5 all 4 extremities, Sensation intact all 4 extremities, Plantars down going.  4. Ears and Eyes appear Normal, Conjunctivae clear, PERRLA. Moist Oral Mucosa.  5. Supple Neck, No JVD, No cervical lymphadenopathy appriciated, No Carotid Bruits.  6. Symmetrical Chest wall movement, Good air movement bilaterally, CTAB.  7. Irr, irr, s1, s2,   8. Positive Bowel Sounds, Abdomen Soft, No tenderness, No organomegaly appriciated,No rebound -guarding or rigidity.  9.  No Cyanosis, Normal Skin Turgor, No Skin Rash or Bruise.  10. Good muscle tone,  joints appear normal , no effusions, Normal ROM.  11. No Palpable Lymph Nodes in Neck or Axillae  No cva tenderness   Data Review:    CBC  Recent Labs Lab 04/16/16 1141 04/17/16 0240  WBC 12.8* 19.3*  HGB 11.5* 11.3*  HCT 33.6* 33.3*  PLT 167 164  MCV 81.2 81.0  MCH 27.8 27.5  MCHC 34.2 33.9  RDW 15.2 15.2  LYMPHSABS  --  2.4  MONOABS  --  1.5*  EOSABS  --  0.1  BASOSABS  --  0.0   ------------------------------------------------------------------------------------------------------------------  Chemistries   Recent Labs Lab 04/16/16 1141 04/17/16 0240  NA 142 144  K 4.4 5.0  CL 110 111  CO2 23 24  GLUCOSE 179* 175*  BUN 32* 35*    CREATININE 2.35*  2.24*  CALCIUM 8.9 9.1   ------------------------------------------------------------------------------------------------------------------ estimated creatinine clearance is 27.2 mL/min (by C-G formula based on SCr of 2.24 mg/dL (H)). ------------------------------------------------------------------------------------------------------------------ No results for input(s): TSH, T4TOTAL, T3FREE, THYROIDAB in the last 72 hours.  Invalid input(s): FREET3  Coagulation profile No results for input(s): INR, PROTIME in the last 168 hours. ------------------------------------------------------------------------------------------------------------------- No results for input(s): DDIMER in the last 72 hours. -------------------------------------------------------------------------------------------------------------------  Cardiac Enzymes No results for input(s): CKMB, TROPONINI, MYOGLOBIN in the last 168 hours.  Invalid input(s): CK ------------------------------------------------------------------------------------------------------------------ No results found for: BNP   ---------------------------------------------------------------------------------------------------------------  Urinalysis    Component Value Date/Time   COLORURINE RED (A) 04/17/2016 0329   APPEARANCEUR CLOUDY (A) 04/17/2016 0329   LABSPEC 1.010 04/17/2016 0329   PHURINE 5.0 04/17/2016 0329   GLUCOSEU NEGATIVE 04/17/2016 0329   GLUCOSEU 100 (A) 05/29/2006 0331   HGBUR LARGE (A) 04/17/2016 0329   BILIRUBINUR NEGATIVE 04/17/2016 0329   BILIRUBINUR neg 02/14/2016 0918   KETONESUR TRACE (A) 04/17/2016 0329   PROTEINUR 100 (A) 04/17/2016 0329   UROBILINOGEN 2.0 02/14/2016 0918   UROBILINOGEN 1.0 12/17/2008 1134   NITRITE POSITIVE (A) 04/17/2016 0329   LEUKOCYTESUR MODERATE (A) 04/17/2016 0329     ----------------------------------------------------------------------------------------------------------------   Imaging Results:    Ct Abdomen Pelvis Wo Contrast  Result Date: 04/17/2016 CLINICAL DATA:  Intermittent hematuria for 2 months, recurrent hematuria yesterday. Recent diagnosis of bladder cyst by urologist. History of hypertension, hyperlipidemia. EXAM: CT ABDOMEN AND PELVIS WITHOUT CONTRAST TECHNIQUE: Multidetector CT imaging of the abdomen and pelvis was performed following the standard protocol without IV contrast. COMPARISON:  CT abdomen and pelvis October 20, 2004 FINDINGS: Moderately motion degraded examination. LOWER CHEST: Mild respiratory motion. Mild fibrotic changes. Moderate cardiomegaly, severe included coronary artery calcifications. Aortic annulus calcifications. HEPATOBILIARY: Status post cholecystectomy.  Liver is normal. PANCREAS: Normal. SPLEEN: Normal. ADRENALS/URINARY TRACT: Kidneys are orthotopic, demonstrating normal size and morphology. Limited assessment for renal masses on this nonenhanced examination. Moderate LEFT hydroureteronephrosis the level of the distal ureter where a is 6.5 x 5 cm mass is present contiguous with the ureterovesicular junction and LEFT pelvic wall. Blood products and Foley catheter within the urinary bladder. 3.1 cm dense mass abutting the RIGHT urinary bladder with surrounding fat stranding. Normal adrenal glands. STOMACH/BOWEL: The stomach, small and large bowel are normal in course and caliber without inflammatory changes, sensitivity decreased by lack of enteric contrast. Normal appendix. VASCULAR/LYMPHATIC: Aortoiliac vessels are normal in course and caliber, severe calcific atherosclerosis. 2 cm mass RIGHT pelvis image 71/98. 3.5 cm RIGHT external iliac chain mass. REPRODUCTIVE: Prostate is enlarged. OTHER: No intraperitoneal free fluid or free air. MUSCULOSKELETAL: Non-acute. Bilateral gynecomastia. Moderate RIGHT and small LEFT fat  containing inguinal hernias. IMPRESSION: Moderately motion degraded examination. 6.5 x 5 cm LEFT pelvic mass encasing the distal ureter, contiguous with the LEFT pelvic wall results in moderate LEFT obstructive uropathy. Differential diagnosis includes primary bladder cancer, transitional cell cancer, less likely lymphoma. Intravesicular hemorrhage with Foley catheter. Recommend cystoscopy. RIGHT pelvic masses most consistent with lymphadenopathy/metastasis. Severe atherosclerosis. Electronically Signed   By: Elon Alas M.D.   On: 04/17/2016 04:35      Assessment & Plan:    Active Problems:   Diabetes mellitus with kidney disease (HCC)   Anemia, iron deficiency   Acute cystitis with hematuria   UTI (urinary tract infection)    1. Gross hematuria Due to foley trauma, uti, pelvic mass? Check psa tx with rocephin 1gm iv qday,  Appreciate urology input regarding pelvic mass? Bladder  cancer  2. Uti tx with rocephin as above  3. Leukocytosis secondary to #2 Check cbc in am  4. CKD stage 3 Check cmp in am  5. Dm2 fsbs ac and qhs, iss  6. Pafib Tele  7. Pelvic mass Appreciate urology input    DVT Prophylaxis SCDs   AM Labs Ordered, also please review Full Orders  Family Communication: Admission, patients condition and plan of care including tests being ordered have been discussed with the patient and son Coralyn Mark) who indicate understanding and agree with the plan and Code Status.  Code Status FULL CODE  Likely DC to  home  Condition GUARDED    Consults called: urology, Dr. Jeffie Pollock per ED  Admission status: inpatient  Time spent in minutes : 68minutes   Jani Gravel M.D on 04/17/2016 at 4:58 AM  Between 7am to 7pm - Pager - [phone]. After 7pm go to [email or web address] - password [facility]  [facility] - Office  [phone]

## 2016-04-17 NOTE — Consult Note (Signed)
Subjective: I was asked to see [name] in consultation by Dr. Isaac Hall for gross hematuria.   He was admitted through the ER last night for clot retention and has a foley with clearing urine on CBI.   He had seen Dr. Diona Hall on 12/12 for gross hematuria and was found to have a necrotic bladder tumor on cystoscopy.  He had continued hematuria after that visit  And was in the ER on Saturday.  A foley was placed and he was sent home.   He has dementia and pulled the foley out unintentionally and had increased bleeding that led to his return to the ER and admission.   He had a normal PSA recently but some prostate asymmetry on exam.   His Cr was 2.33 on 12/12 and is stable at 2/24 today.  He had a non-contrast CT that shows a 6.5x5 cm left pelvic mass encasing the left ureter with obstruction.  There is right pelvic adenopathy and a left bladder wall mass as well.  There is clot in the bladder.   His UA has TNTC WBC and RBC with many bacteria and is nit+.  His urine has cleared on CBI and Abx.   ROS:  Review of Systems  Unable to perform ROS: Dementia    No Known Allergies  Past Medical History:  Diagnosis Date  . Acute biliary pancreatitis   . Anemia   . Atrial fibrillation (Tyrone Hall)   . B12 deficiency   . Carotid artery disease (HCC)    60-79% RICA and 123456 LICA -  [id number]  . Coronary atherosclerosis of native coronary artery    Details not clear  . Dementia   . Essential hypertension, benign   . Mixed hyperlipidemia   . OA (osteoarthritis) of knee   . Stroke Tyrone Hall) 2008  . TIA (transient ischemic attack)   . Type 2 diabetes mellitus ([place])     Past Surgical History:  Procedure Laterality Date  . CHOLECYSTECTOMY    . COLONOSCOPY    01/20/2008   [id number], normal rectum, left-sided diverticula  . Common bile duct stone removal     s/p ercp with biliary and pancreatic stent placement and subsequent removal 2005  . ESOPHAGOGASTRODUODENOSCOPY    01/20/2008   [id number]  esophagus, small hiatal hernia, healing ulcer scar antrum . bx mild inflammation c/w with healed ulceration. no h.pylori, small bowel bx negative.  . ESOPHAGOGASTRODUODENOSCOPY N/A 10/29/2013   RMR: gastric erosinons of uncertain significance-status post gastric biopsy.  Marland Kitchen HEMORRHOID SURGERY      Social History   Social History  . Marital status: Divorced    Spouse name: N/A  . Number of children: 2  . Years of education: N/A   Occupational History  . Retired   .  Retired   Social History Main Topics  . Smoking status: Former Smoker    Types: Cigarettes    Quit date: 05/18/1983  . Smokeless tobacco: Never Used     Comment: Quit x 50 years  . Alcohol use No  . Drug use: No  . Sexual activity: Not on file   Other Topics Concern  . Not on file   Social History Narrative  . No narrative on file    Family History  Problem Relation Age of Onset  . Hypertension Son   . Diabetes type II    . Colon cancer Neg Hx     Anti-infectives: Anti-infectives    Start     Dose/Rate Route Frequency Ordered  Stop   04/17/16 2200  cefTRIAXone (ROCEPHIN) 1 g in dextrose 5 % 50 mL IVPB     1 g 100 mL/hr over 30 Minutes Intravenous Every 24 hours 04/17/16 0812     04/17/16 0615  cefTRIAXone (ROCEPHIN) 1 g in dextrose 5 % 50 mL IVPB     1 g 100 mL/hr over 30 Minutes Intravenous Every 24 hours 04/17/16 0600     04/17/16 0200  cefTRIAXone (ROCEPHIN) 1 g in dextrose 5 % 50 mL IVPB     1 g 100 mL/hr over 30 Minutes Intravenous  Once 04/17/16 0153 04/17/16 [id number]      Current Facility-Administered Medications  Medication Dose Route Frequency Provider Last Rate Last Dose  . acetaminophen (TYLENOL) tablet 650 mg  650 mg Oral Q6H PRN Jani Gravel, MD       Or  . acetaminophen (TYLENOL) suppository 650 mg  650 mg Rectal Q6H PRN Jani Gravel, MD      . cefTRIAXone (ROCEPHIN) 1 g in dextrose 5 % 50 mL IVPB  1 g Intravenous Q24H Jani Gravel, MD      . cefTRIAXone (ROCEPHIN) 1 g in dextrose 5 % 50 mL IVPB   1 g Intravenous Q24H Jani Gravel, MD      . diltiazem (CARDIZEM CD) 24 hr capsule 180 mg  180 mg Oral Daily Jani Gravel, MD   180 mg at 04/17/16 1020  . donepezil (ARICEPT) tablet 10 mg  10 mg Oral QHS Jani Gravel, MD      . ferrous sulfate tablet 325 mg  325 mg Oral Q breakfast Jani Gravel, MD      . furosemide (LASIX) tablet 20 mg  20 mg Oral Daily Jani Gravel, MD   20 mg at 04/17/16 1020  . insulin aspart (novoLOG) injection 0-5 Units  0-5 Units Subcutaneous QHS Jani Gravel, MD      . insulin aspart (novoLOG) injection 0-9 Units  0-9 Units Subcutaneous TID WC Jani Gravel, MD   1 Units at 04/17/16 [phone]  . loratadine (CLARITIN) tablet 10 mg  10 mg Oral Daily Jani Gravel, MD   10 mg at 04/17/16 1020  . losartan (COZAAR) tablet 25 mg  25 mg Oral Daily Jani Gravel, MD   25 mg at 04/17/16 1021  . pravastatin (PRAVACHOL) tablet 40 mg  40 mg Oral q1800 Jani Gravel, MD       Past medical, surgical, social and family history reviewed.   Objective: Vital signs in last 24 hours: Temp:  [97.7 F (36.5 C)-98.1 F (36.7 C)] 98.1 F (36.7 C) (01/01 0813) Pulse Rate:  [68-170] 100 (01/01 0813) Resp:  [18-20] 18 (01/01 0813) BP: (115-181)/(69-130) 156/127 (01/01 0813) SpO2:  [97 %-100 %] 100 % (01/01 0813) Weight:  [82.4 kg (181 lb 9.6 oz)] 82.4 kg (181 lb 9.6 oz) (01/01 0813)  Intake/Output from previous day: 12/31 0701 - 01/01 0700 In: 1050 [IV Piggyback:1050] Out: -  Intake/Output this shift: Total I/O In: 9500 [P.O.:500; Other:9000] Out: 10500 [Urine:10500]   Physical Exam  Constitutional: He is well-developed, well-nourished, and in no distress.  HENT:  Head: Normocephalic and atraumatic.  Neck: Normal range of motion. Neck supple. No thyromegaly present.  Cardiovascular:  IRR with SEM 4/6  Pulmonary/Chest: Effort normal and breath sounds normal.  Abdominal: Soft. He exhibits no distension and no mass. There is tenderness (suprapubic). There is no guarding.  Genitourinary:  Genitourinary Comments: Per  Tyrone Hall recent physical: normal genitalia and the prostate is soft but  asymmetric.   Musculoskeletal: Normal range of motion. He exhibits tenderness (RUE with recent fracture.). He exhibits no edema.  Lymphadenopathy:    He has no cervical adenopathy.    He has no axillary adenopathy.       Right: No supraclavicular adenopathy present.       Left: No supraclavicular adenopathy present.  Neurological:  arousable but not oriented  Skin: Skin is warm and dry.    Lab Results:   Recent Labs  04/17/16 0240 04/17/16 0856  WBC 19.3* 16.0*  HGB 11.3* 10.7*  HCT 33.3* 31.7*  PLT 164 111*   BMET  Recent Labs  04/17/16 0240 04/17/16 0856  NA 144 141  K 5.0 4.3  CL 111 110  CO2 24 23  GLUCOSE 175* 144*  BUN 35* 31*  CREATININE 2.24* 2.12*  CALCIUM 9.1 8.5*   PT/INR  Recent Labs  04/17/16 0659  LABPROT 15.9*  INR 1.26   ABG No results for input(s): PHART, HCO3 in the last 72 hours.  Invalid input(s): PCO2, PO2  Studies/Results: Ct Abdomen Pelvis Wo Contrast  Result Date: 04/17/2016 CLINICAL DATA:  Intermittent hematuria for 2 months, recurrent hematuria yesterday. Recent diagnosis of bladder cyst by urologist. History of hypertension, hyperlipidemia. EXAM: CT ABDOMEN AND PELVIS WITHOUT CONTRAST TECHNIQUE: Multidetector CT imaging of the abdomen and pelvis was performed following the standard protocol without IV contrast. COMPARISON:  CT abdomen and pelvis October 20, 2004 FINDINGS: Moderately motion degraded examination. LOWER CHEST: Mild respiratory motion. Mild fibrotic changes. Moderate cardiomegaly, severe included coronary artery calcifications. Aortic annulus calcifications. HEPATOBILIARY: Status post cholecystectomy.  Liver is normal. PANCREAS: Normal. SPLEEN: Normal. ADRENALS/URINARY TRACT: Kidneys are orthotopic, demonstrating normal size and morphology. Limited assessment for renal masses on this nonenhanced examination. Moderate LEFT hydroureteronephrosis the  level of the distal ureter where a is 6.5 x 5 cm mass is present contiguous with the ureterovesicular junction and LEFT pelvic wall. Blood products and Foley catheter within the urinary bladder. 3.1 cm dense mass abutting the RIGHT urinary bladder with surrounding fat stranding. Normal adrenal glands. STOMACH/BOWEL: The stomach, small and large bowel are normal in course and caliber without inflammatory changes, sensitivity decreased by lack of enteric contrast. Normal appendix. VASCULAR/LYMPHATIC: Aortoiliac vessels are normal in course and caliber, severe calcific atherosclerosis. 2 cm mass RIGHT pelvis image 71/98. 3.5 cm RIGHT external iliac chain mass. REPRODUCTIVE: Prostate is enlarged. OTHER: No intraperitoneal free fluid or free air. MUSCULOSKELETAL: Non-acute. Bilateral gynecomastia. Moderate RIGHT and small LEFT fat containing inguinal hernias. IMPRESSION: Moderately motion degraded examination. 6.5 x 5 cm LEFT pelvic mass encasing the distal ureter, contiguous with the LEFT pelvic wall results in moderate LEFT obstructive uropathy. Differential diagnosis includes primary bladder cancer, transitional cell cancer, less likely lymphoma. Intravesicular hemorrhage with Foley catheter. Recommend cystoscopy. RIGHT pelvic masses most consistent with lymphadenopathy/metastasis. Severe atherosclerosis. Electronically Signed   By: Elon Alas M.D.   On: 04/17/2016 04:35   Case discussed with ED physician.   I have reviewed the recent office notes and cystoscopy report.   I have reviewed his UA and labs.   I have reviewed the CT films and report.   Assessment: 1. Gross hematuria with clot retention and possible UTI in a patient with probable metastatic bladder cancer.   Urine clearing on CBI and antibiotics.   I will discuss with Dr. Diona Hall who should be in [place] tomorrow.   He may need a palliative TURBT.   2.  Stable renal insufficiency with left ureteral obstruction  from 6.5cm left pelvic  mass.   Cr is improving, stenting would be difficult and a perc would be needed to decompress the kidney, but not recommend that acutely.    CC: Dr. Isaac Hall.     Miabella Shannahan J 04/17/2016 [id number]

## 2016-04-17 NOTE — Progress Notes (Signed)
Patient seen and examined, database reviewed. No family members at bedside. Patient admitted earlier today due to hematuria with clot retention and possible UTI in a patient with probable metastatic bladder cancer. Has already been seen by urology, plan to continue CBI and antibiotics. Urology deciding on plan of action. Will continue to follow.  Domingo Mend, MD [facility] Pager: [phone]

## 2016-04-18 DIAGNOSIS — R310 Gross hematuria: Secondary | ICD-10-CM

## 2016-04-18 DIAGNOSIS — N189 Chronic kidney disease, unspecified: Secondary | ICD-10-CM

## 2016-04-18 DIAGNOSIS — N3289 Other specified disorders of bladder: Secondary | ICD-10-CM

## 2016-04-18 LAB — GLUCOSE, CAPILLARY
GLUCOSE-CAPILLARY: 114 mg/dL — AB (ref 65–99)
Glucose-Capillary: 137 mg/dL — ABNORMAL HIGH (ref 65–99)
Glucose-Capillary: 137 mg/dL — ABNORMAL HIGH (ref 65–99)
Glucose-Capillary: 116 mg/dL — ABNORMAL HIGH (ref 65–99)

## 2016-04-18 LAB — URINE CULTURE
CULTURE: NO GROWTH
Culture: <10000 — AB

## 2016-04-18 LAB — BASIC METABOLIC PANEL
ANION GAP: 8 (ref 5–15)
BUN: 22 mg/dL — ABNORMAL HIGH (ref 6–20)
CALCIUM: 8.3 mg/dL — AB (ref 8.9–10.3)
CO2: 22 mmol/L (ref 22–32)
Chloride: 106 mmol/L (ref 101–111)
Creatinine, Ser: 1.83 mg/dL — ABNORMAL HIGH (ref 0.61–1.24)
GFR calc Af Amer: 38 mL/min — ABNORMAL LOW (ref >60)
GFR, EST NON AFRICAN AMERICAN: 32 mL/min — AB (ref >60)
Glucose, Bld: 146 mg/dL — ABNORMAL HIGH (ref 65–99)
Potassium: 4 mmol/L (ref 3.5–5.1)
Sodium: 136 mmol/L (ref 135–145)

## 2016-04-18 LAB — HEMOGLOBIN A1C
HEMOGLOBIN A1C: 6.4 % — AB (ref 4.8–5.6)
MEAN PLASMA GLUCOSE: 137 mg/dL

## 2016-04-18 NOTE — Progress Notes (Addendum)
PROGRESS NOTE    Tyrone Hall  [id number] DOB: 23-Oct-1930 DOA: 04/16/2016 PCP: Tula Nakayama, MD     Brief Narrative:  83 year old man admitted to the hospital on 12/31 with gross hematuria due to Foley catheter trauma. He has recently been diagnosed with bladder cancer. Urology is on board   Assessment & Plan:   Principal Problem:   Bladder cancer [facility]) Active Problems:   Hematuria   Diabetes mellitus with kidney disease ([employer])   Anemia, iron deficiency   UTI (urinary tract infection)   Bladder cancer with gross hematuria -Catheter again clotted last night, with CBI urine seems to have cleared. -Recommendations from urology's note have been reviewed and discussed with family. The option that seems to be most agreeable at this point is to discharge him home, hopefully tomorrow as long as his hematuria resolves, with catheter in place; however if he continues to have bleeding episodes may need to look at a palliative resection of his tumor -Urine culture is negative, will discontinue antibiotics.  Stage III chronic kidney disease -Creatinine is currently at baseline  Type 2 diabetes -Well-controlled.   DVT prophylaxis: SCDs Code Status: Full code Family Communication: Ex-wife at bedside updated on plan of care and questions answered Disposition Plan: Likely DC home in 24 hours  Consultants:   Urology  Procedures:   Continuous bladder irrigation  Antimicrobials:  Anti-infectives    Start     Dose/Rate Route Frequency Ordered Stop   04/17/16 2200  cefTRIAXone (ROCEPHIN) 1 g in dextrose 5 % 50 mL IVPB  Status:  Discontinued     1 g 100 mL/hr over 30 Minutes Intravenous Every 24 hours 04/17/16 0812 04/18/16 1349   04/17/16 0615  cefTRIAXone (ROCEPHIN) 1 g in dextrose 5 % 50 mL IVPB  Status:  Discontinued     1 g 100 mL/hr over 30 Minutes Intravenous Every 24 hours 04/17/16 0600 04/18/16 1349   04/17/16 0200  cefTRIAXone (ROCEPHIN) 1 g in dextrose 5 % 50 mL  IVPB     1 g 100 mL/hr over 30 Minutes Intravenous  Once 04/17/16 0153 04/17/16 0339       Subjective: He seems lethargic today, per family member has not been complaining of pain  Objective: Vitals:   04/17/16 1433 04/17/16 2108 04/18/16 0444 04/18/16 1306  BP: (!) 148/64 (!) 151/85 (!) 144/78 134/84  Pulse: 79 64 94 (!) 52  Resp: 18 18 18 20   Temp: 97.5 F (36.4 C) 99.4 F (37.4 C) 98.7 F (37.1 C) 98.2 F (36.8 C)  TempSrc: Oral Oral Oral Oral  SpO2: 100% 100% 95% 99%  Weight:   84.6 kg (186 lb 8 oz)   Height:        Intake/Output Summary (Last 24 hours) at 04/18/16 1352 Last data filed at 04/18/16 1315  Gross per 24 hour  Intake            42880 ml  Output            50225 ml  Net            -7345 ml   Filed Weights   04/17/16 0813 04/18/16 0444  Weight: 82.4 kg (181 lb 9.6 oz) 84.6 kg (186 lb 8 oz)    Examination:  General exam: Drowsy Respiratory system: Clear to auscultation. Respiratory effort normal. Cardiovascular system:RRR. No murmurs, rubs, gallops. Gastrointestinal system: Abdomen is nondistended, soft and nontender. No organomegaly or masses felt. Normal bowel sounds heard. Central nervous system: Drowsy, moves  all 4 spontaneously Extremities: No C/C/E, +pedal pulses Skin: No rashes, lesions or ulcers Psychiatry: Unable to assess given current mental state    Data Reviewed: I have personally reviewed following labs and imaging studies  CBC:  Recent Labs Lab 04/16/16 1141 04/17/16 0240 04/17/16 0856  WBC 12.8* 19.3* 16.0*  NEUTROABS  --  15.2*  --   HGB 11.5* 11.3* 10.7*  HCT 33.6* 33.3* 31.7*  MCV 81.2 81.0 81.3  PLT 167 164 [id number]*   Basic Metabolic Panel:  Recent Labs Lab 04/16/16 1141 04/17/16 0240 04/17/16 0856 04/18/16 0711  NA 142 144 141 136  K 4.4 5.0 4.3 4.0  CL 110 111 110 106  CO2 23 24 23 22   GLUCOSE 179* 175* 144* 146*  BUN 32* 35* 31* 22*  CREATININE 2.35* 2.24* 2.12* 1.83*  CALCIUM 8.9 9.1 8.5* 8.3*    GFR: Estimated Creatinine Clearance: 31.6 mL/min (by C-G formula based on SCr of 1.83 mg/dL (H)). Liver Function Tests:  Recent Labs Lab 04/17/16 0856  AST 12*  ALT 12*  ALKPHOS 76  BILITOT 0.8  PROT 6.7  ALBUMIN 2.8*   No results for input(s): LIPASE, AMYLASE in the last 168 hours. No results for input(s): AMMONIA in the last 168 hours. Coagulation Profile:  Recent Labs Lab 04/17/16 0659  INR 1.26   Cardiac Enzymes: No results for input(s): CKTOTAL, CKMB, CKMBINDEX, TROPONINI in the last 168 hours. BNP (last 3 results) No results for input(s): PROBNP in the last 8760 hours. HbA1C:  Recent Labs  04/17/16 0856  HGBA1C 6.4*   CBG:  Recent Labs Lab 04/17/16 1147 04/17/16 1634 04/17/16 2106 04/18/16 0729 04/18/16 1157  GLUCAP 181* 145* 130* 137* 137*   Lipid Profile: No results for input(s): CHOL, HDL, LDLCALC, TRIG, CHOLHDL, LDLDIRECT in the last 72 hours. Thyroid Function Tests: No results for input(s): TSH, T4TOTAL, FREET4, T3FREE, THYROIDAB in the last 72 hours. Anemia Panel: No results for input(s): VITAMINB12, FOLATE, FERRITIN, TIBC, IRON, RETICCTPCT in the last 72 hours. Urine analysis:    Component Value Date/Time   COLORURINE RED (A) 04/17/2016 0329   APPEARANCEUR CLOUDY (A) 04/17/2016 0329   LABSPEC 1.010 04/17/2016 0329   PHURINE 5.0 04/17/2016 0329   GLUCOSEU NEGATIVE 04/17/2016 0329   GLUCOSEU 100 (A) 05/29/2006 0331   HGBUR LARGE (A) 04/17/2016 0329   BILIRUBINUR NEGATIVE 04/17/2016 0329   BILIRUBINUR neg 02/14/2016 0918   KETONESUR TRACE (A) 04/17/2016 0329   PROTEINUR 100 (A) 04/17/2016 0329   UROBILINOGEN 2.0 02/14/2016 0918   UROBILINOGEN 1.0 12/17/2008 1134   NITRITE POSITIVE (A) 04/17/2016 0329   LEUKOCYTESUR MODERATE (A) 04/17/2016 0329   Sepsis Labs: @LABRCNTIP (procalcitonin:4,lacticidven:4)  ) Recent Results (from the past 240 hour(s))  Urine culture     Status: None   Collection Time: 04/16/16 11:41 AM  Result Value  Ref Range Status   Specimen Description URINE, RANDOM  Final   Special Requests NONE  Final   Culture NO GROWTH Performed at [facility]   Final   Report Status 04/18/2016 FINAL  Final  Urine culture     Status: Abnormal   Collection Time: 04/17/16  3:29 AM  Result Value Ref Range Status   Specimen Description URINE, CATHETERIZED  Final   Special Requests NONE  Final   Culture (A)  Final    <10,000 COLONIES/mL INSIGNIFICANT GROWTH Performed at [facility]    Report Status 04/18/2016 FINAL  Final         Radiology Studies: Ct  Abdomen Pelvis Wo Contrast  Result Date: 04/17/2016 CLINICAL DATA:  Intermittent hematuria for 2 months, recurrent hematuria yesterday. Recent diagnosis of bladder cyst by urologist. History of hypertension, hyperlipidemia. EXAM: CT ABDOMEN AND PELVIS WITHOUT CONTRAST TECHNIQUE: Multidetector CT imaging of the abdomen and pelvis was performed following the standard protocol without IV contrast. COMPARISON:  CT abdomen and pelvis October 20, 2004 FINDINGS: Moderately motion degraded examination. LOWER CHEST: Mild respiratory motion. Mild fibrotic changes. Moderate cardiomegaly, severe included coronary artery calcifications. Aortic annulus calcifications. HEPATOBILIARY: Status post cholecystectomy.  Liver is normal. PANCREAS: Normal. SPLEEN: Normal. ADRENALS/URINARY TRACT: Kidneys are orthotopic, demonstrating normal size and morphology. Limited assessment for renal masses on this nonenhanced examination. Moderate LEFT hydroureteronephrosis the level of the distal ureter where a is 6.5 x 5 cm mass is present contiguous with the ureterovesicular junction and LEFT pelvic wall. Blood products and Foley catheter within the urinary bladder. 3.1 cm dense mass abutting the RIGHT urinary bladder with surrounding fat stranding. Normal adrenal glands. STOMACH/BOWEL: The stomach, small and large bowel are normal in course and caliber without inflammatory changes,  sensitivity decreased by lack of enteric contrast. Normal appendix. VASCULAR/LYMPHATIC: Aortoiliac vessels are normal in course and caliber, severe calcific atherosclerosis. 2 cm mass RIGHT pelvis image 71/98. 3.5 cm RIGHT external iliac chain mass. REPRODUCTIVE: Prostate is enlarged. OTHER: No intraperitoneal free fluid or free air. MUSCULOSKELETAL: Non-acute. Bilateral gynecomastia. Moderate RIGHT and small LEFT fat containing inguinal hernias. IMPRESSION: Moderately motion degraded examination. 6.5 x 5 cm LEFT pelvic mass encasing the distal ureter, contiguous with the LEFT pelvic wall results in moderate LEFT obstructive uropathy. Differential diagnosis includes primary bladder cancer, transitional cell cancer, less likely lymphoma. Intravesicular hemorrhage with Foley catheter. Recommend cystoscopy. RIGHT pelvic masses most consistent with lymphadenopathy/metastasis. Severe atherosclerosis. Electronically Signed   By: Elon Alas M.D.   On: 04/17/2016 04:35        Scheduled Meds: . diltiazem  180 mg Oral Daily  . donepezil  10 mg Oral QHS  . ferrous sulfate  325 mg Oral Q breakfast  . furosemide  20 mg Oral Daily  . insulin aspart  0-5 Units Subcutaneous QHS  . insulin aspart  0-9 Units Subcutaneous TID WC  . loratadine  10 mg Oral Daily  . losartan  25 mg Oral Daily  . pravastatin  40 mg Oral q1800   Continuous Infusions:   LOS: 1 day    Time spent: 25 minutes. Greater than 50% of this time was spent in direct contact with the patient coordinating care.     Lelon Frohlich, MD [facility] Pager [phone]  If 7PM-7AM, please contact night-coverage [email or web address] Password [email or web address] 04/18/2016, 1:52 PM

## 2016-04-18 NOTE — Progress Notes (Signed)
PT became tachycardic and central telemetry notified. PT restless and c/o of abdominal pain.  PT was retaining urine from 3 way catheter. Catheter had clotted off again. Manually irrigated catheter, removing many small clots. Catheter reconnected and patent. Urine progressed from red to pink with no clots. PT HR decreased to around 100-105 bpm. Continue to monitor.

## 2016-04-18 NOTE — Progress Notes (Signed)
PT catheter stopped flowing. PT became restless, agitated and uncooperative. Manually irrigated cathter, as it was clogged with blood clots. Clots removed and catheter patent. Urne pink with very few clots at this time. Continue to monitor.

## 2016-04-18 NOTE — Care Management Note (Signed)
Case Management Note  Patient Details  Name: [name] MRN: [id number] Date of Birth: 15-Feb-1931  Subjective/Objective:     Patient adm from home with acute cystitis. He may need a palliative TURBT at some point. Urology following. Spoke with daughter at bedside. Patient lives with her and grandson. She plans to take him home with home health. Will discuss with attending to  Make sure this is appropriate.   Action/Plan: Offered choice. Family would like to use [facility]. Romualdo Bolk. Family made aware [facility] has 48 hours to initiate services.    Expected Discharge Date:  04/19/16               Expected Discharge Plan:  [place]  In-House Referral:  NA  Discharge planning Services  CM Consult  Post Acute Care Choice:  Home Health Choice offered to:  Adult Children  DME Arranged:    DME Agency:     HH Arranged:  RN, PT, Nurse's Aide, Social Work [employer] Agency:  [place]  Status of Service:  In process, will continue to follow  If discussed at Long Length of Stay Meetings, dates discussed:    Additional Comments:  Vonzell Lindblad, Chauncey Reading, RN 04/18/2016, 3:05 PM

## 2016-04-18 NOTE — Progress Notes (Signed)
Subjective: Patient has been lethargic today.  Apparently, he has not been in any pain.  Objective: Vital signs in last 24 hours: Temp:  [97.5 F (36.4 C)-99.4 F (37.4 C)] 98.2 F (36.8 C) (01/02 1306) Pulse Rate:  [52-94] 52 (01/02 1306) Resp:  [18-20] 20 (01/02 1306) BP: (134-151)/(64-85) 134/84 (01/02 1306) SpO2:  [95 %-100 %] 99 % (01/02 1306) Weight:  [84.6 kg (186 lb 8 oz)] 84.6 kg (186 lb 8 oz) (01/02 0444)  Intake/Output from previous day: 01/01 0701 - 01/02 0700 In: [id number] [P.O.:1040; IV Piggyback:100] Out: [id number] [Urine:53750] Intake/Output this shift: Total I/O In: 3240 [P.O.:240; Other:3000] Out: 11900 [Urine:11900]  Physical Exam:  Constitutional: Vital signs reviewed.  Patient lethargic, but responds to verbal stimuli. Eyes: PERRL, No scleral icterus.   Cardiovascular: RRR Pulmonary/Chest: Normal effort Abdominal: Soft. Non-tender, non-distended, bowel sounds are normal, no masses, organomegaly, or guarding present.  Extremities: No cyanosis or edema   Lab Results:  Recent Labs  04/16/16 1141 04/17/16 0240 04/17/16 0856  HGB 11.5* 11.3* 10.7*  HCT 33.6* 33.3* 31.7*   BMET  Recent Labs  04/17/16 0856 04/18/16 0711  NA 141 136  K 4.3 4.0  CL 110 106  CO2 23 22  GLUCOSE 144* 146*  BUN 31* 22*  CREATININE 2.12* 1.83*  CALCIUM 8.5* 8.3*    Recent Labs  04/17/16 0659  INR 1.26   No results for input(s): LABURIN in the last 72 hours. Results for orders placed or performed during the hospital encounter of 04/16/16  Urine culture     Status: Abnormal   Collection Time: 04/17/16  3:29 AM  Result Value Ref Range Status   Specimen Description URINE, CATHETERIZED  Final   Special Requests NONE  Final   Culture (A)  Final    <10,000 COLONIES/mL INSIGNIFICANT GROWTH Performed at [facility]    Report Status 04/18/2016 FINAL  Final    Studies/Results: Ct Abdomen Pelvis Wo Contrast  Result Date: 04/17/2016 CLINICAL DATA:   Intermittent hematuria for 2 months, recurrent hematuria yesterday. Recent diagnosis of bladder cyst by urologist. History of hypertension, hyperlipidemia. EXAM: CT ABDOMEN AND PELVIS WITHOUT CONTRAST TECHNIQUE: Multidetector CT imaging of the abdomen and pelvis was performed following the standard protocol without IV contrast. COMPARISON:  CT abdomen and pelvis October 20, 2004 FINDINGS: Moderately motion degraded examination. LOWER CHEST: Mild respiratory motion. Mild fibrotic changes. Moderate cardiomegaly, severe included coronary artery calcifications. Aortic annulus calcifications. HEPATOBILIARY: Status post cholecystectomy.  Liver is normal. PANCREAS: Normal. SPLEEN: Normal. ADRENALS/URINARY TRACT: Kidneys are orthotopic, demonstrating normal size and morphology. Limited assessment for renal masses on this nonenhanced examination. Moderate LEFT hydroureteronephrosis the level of the distal ureter where a is 6.5 x 5 cm mass is present contiguous with the ureterovesicular junction and LEFT pelvic wall. Blood products and Foley catheter within the urinary bladder. 3.1 cm dense mass abutting the RIGHT urinary bladder with surrounding fat stranding. Normal adrenal glands. STOMACH/BOWEL: The stomach, small and large bowel are normal in course and caliber without inflammatory changes, sensitivity decreased by lack of enteric contrast. Normal appendix. VASCULAR/LYMPHATIC: Aortoiliac vessels are normal in course and caliber, severe calcific atherosclerosis. 2 cm mass RIGHT pelvis image 71/98. 3.5 cm RIGHT external iliac chain mass. REPRODUCTIVE: Prostate is enlarged. OTHER: No intraperitoneal free fluid or free air. MUSCULOSKELETAL: Non-acute. Bilateral gynecomastia. Moderate RIGHT and small LEFT fat containing inguinal hernias. IMPRESSION: Moderately motion degraded examination. 6.5 x 5 cm LEFT pelvic mass encasing the distal ureter, contiguous with the LEFT pelvic  wall results in moderate LEFT obstructive uropathy.  Differential diagnosis includes primary bladder cancer, transitional cell cancer, less likely lymphoma. Intravesicular hemorrhage with Foley catheter. Recommend cystoscopy. RIGHT pelvic masses most consistent with lymphadenopathy/metastasis. Severe atherosclerosis. Electronically Signed   By: Elon Alas M.D.   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  In this 83 year old male with dementia, aggressive therapy is not a valid treatment strategy.  The patient was admitted for gross hematuria secondary to catheter trauma.  Currently, his urine is clear, with the continuous bladder irrigation being tapered off.    I had along discussion with the patient's family today.  One option, especially if there is persistent bleeding, is to perform a palliative resection cystoscopically/under anesthesia of his bladder tumor.  The other option, which I think is attractive at this point--especially if he does not have significant bleeding continuing, is to just let him go home eventually with a catheter, and follow-up in our office in a couple of weeks.  If he does have persistent bleeding episodes, then we can look towards resection of his tumor for palliation.    We will check on him tomorrow.  Since his culture was negative, I don't think he necessarily needs antibiotics for his urinary tract.   LOS: 1 day   Franchot Gallo M 04/18/2016, 1:30 PM

## 2016-04-19 ENCOUNTER — Encounter: Payer: Self-pay | Admitting: Orthopaedic Surgery

## 2016-04-19 ENCOUNTER — Ambulatory Visit: Payer: Medicare Other | Admitting: Orthopaedic Surgery

## 2016-04-19 DIAGNOSIS — D509 Iron deficiency anemia, unspecified: Secondary | ICD-10-CM

## 2016-04-19 DIAGNOSIS — C679 Malignant neoplasm of bladder, unspecified: Secondary | ICD-10-CM

## 2016-04-19 DIAGNOSIS — E1121 Type 2 diabetes mellitus with diabetic nephropathy: Secondary | ICD-10-CM

## 2016-04-19 DIAGNOSIS — R319 Hematuria, unspecified: Secondary | ICD-10-CM

## 2016-04-19 LAB — BASIC METABOLIC PANEL
Anion gap: 8 (ref 5–15)
BUN: 22 mg/dL — ABNORMAL HIGH (ref 6–20)
CO2: 23 mmol/L (ref 22–32)
Calcium: 8.3 mg/dL — ABNORMAL LOW (ref 8.9–10.3)
Chloride: 106 mmol/L (ref 101–111)
Creatinine, Ser: 2.1 mg/dL — ABNORMAL HIGH (ref 0.61–1.24)
GFR calc Af Amer: 32 mL/min — ABNORMAL LOW (ref >60)
GFR calc non Af Amer: 27 mL/min — ABNORMAL LOW (ref >60)
Glucose, Bld: 110 mg/dL — ABNORMAL HIGH (ref 65–99)
Potassium: 4 mmol/L (ref 3.5–5.1)
Sodium: 137 mmol/L (ref 135–145)

## 2016-04-19 LAB — GLUCOSE, CAPILLARY
GLUCOSE-CAPILLARY: 144 mg/dL — AB (ref 65–99)
GLUCOSE-CAPILLARY: 142 mg/dL — AB (ref 65–99)
Glucose-Capillary: 159 mg/dL — ABNORMAL HIGH (ref 65–99)
Glucose-Capillary: 116 mg/dL — ABNORMAL HIGH (ref 65–99)

## 2016-04-19 LAB — CBC
HCT: 30.3 % — ABNORMAL LOW (ref 39.0–52.0)
Hemoglobin: 10.4 g/dL — ABNORMAL LOW (ref 13.0–17.0)
MCH: 27.7 pg (ref 26.0–34.0)
MCHC: 34.3 g/dL (ref 30.0–36.0)
MCV: 80.8 fL (ref 78.0–100.0)
PLATELETS: 160 10*3/uL (ref 150–400)
RBC: 3.75 MIL/uL — ABNORMAL LOW (ref 4.22–5.81)
RDW: 15.1 % (ref 11.5–15.5)
WBC: 13.9 10*3/uL — AB (ref 4.0–10.5)

## 2016-04-19 NOTE — Progress Notes (Signed)
PROGRESS NOTE    Tyrone Hall  [id number] DOB: 1930-07-23 DOA: 04/16/2016 PCP: Tula Nakayama, MD   Brief Narrative:  83 year old man admitted to the hospital on 12/31 with gross hematuria due to Foley catheter trauma. He has recently been diagnosed with bladder cancer. 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  -Urology to re-evaluate today. The option that seems to be most agreeable at this point is to discharge him home, hopefully tomorrow as long as his hematuria resolves, with catheter in place; however if he continues to have bleeding episodes may need to look at a palliative resection of his tumor -Urine culture is negative, will discontinue antibiotics.  Stage III chronic kidney disease -Creatinine is currently at baseline  Type 2 diabetes -Well-controlled. CBG (last 3)   Recent Labs  04/18/16 1624 04/18/16 2100 04/19/16 0738  GLUCAP 116* 114* 144*   DVT prophylaxis: SCDs Code Status: Full code Family Communication: Ex-wife at bedside updated on plan of care and questions answered Disposition Plan: Home soon ?1/4  Consultants:   Urology  Procedures:   Continuous bladder irrigation  Antimicrobials:  Anti-infectives    Start     Dose/Rate Route Frequency Ordered Stop   04/17/16 2200  cefTRIAXone (ROCEPHIN) 1 g in dextrose 5 % 50 mL IVPB  Status:  Discontinued     1 g 100 mL/hr over 30 Minutes Intravenous Every 24 hours 04/17/16 0812 04/18/16 1349   04/17/16 0615  cefTRIAXone (ROCEPHIN) 1 g in dextrose 5 % 50 mL IVPB  Status:  Discontinued     1 g 100 mL/hr over 30 Minutes Intravenous Every 24 hours 04/17/16 0600 04/18/16 1349   04/17/16 0200   cefTRIAXone (ROCEPHIN) 1 g in dextrose 5 % 50 mL IVPB     1 g 100 mL/hr over 30 Minutes Intravenous  Once 04/17/16 0153 04/17/16 0339       Subjective: He seems lethargic today, per family member has not been complaining of pain  Objective: Vitals:   04/18/16 0444 04/18/16 1306 04/18/16 2102 04/19/16 0514  BP: (!) 144/78 134/84 133/83 137/67  Pulse: 94 (!) 52 83 (!) 106  Resp: 18 20 18 18   Temp: 98.7 F (37.1 C) 98.2 F (36.8 C) 97.4 F (36.3 C) 98.4 F (36.9 C)  TempSrc: Oral Oral Oral Oral  SpO2: 95% 99% 100% 100%  Weight: 84.6 kg (186 lb 8 oz)   82.8 kg (182 lb 8 oz)  Height:        Intake/Output Summary (Last 24 hours) at 04/19/16 0955 Last data filed at 04/19/16 0303  Gross per 24 hour  Intake             6120 ml  Output             9100 ml  Net            -2980 ml   Filed Weights   04/17/16 0813 04/18/16 0444 04/19/16 0514  Weight: 82.4 kg (181 lb 9.6 oz) 84.6 kg (186 lb 8 oz) 82.8 kg (182 lb 8 oz)    Examination:  General exam: awake, alert, NAD Respiratory system: Clear to auscultation. Respiratory effort normal. Cardiovascular system:RRR. No  murmurs, rubs, gallops. Gastrointestinal system: Abdomen is nondistended, soft and nontender. No organomegaly or masses felt. Normal bowel sounds heard. Central nervous system:  moves all 4 spontaneously Extremities: No C/C/E, +pedal pulses Skin: No rashes, lesions or ulcers Psychiatry:dementia  Data Reviewed: I have personally reviewed following labs and imaging studies  CBC:  Recent Labs Lab 04/16/16 1141 04/17/16 0240 04/17/16 0856 04/19/16 0407  WBC 12.8* 19.3* 16.0* 13.9*  NEUTROABS  --  15.2*  --   --   HGB 11.5* 11.3* 10.7* 10.4*  HCT 33.6* 33.3* 31.7* 30.3*  MCV 81.2 81.0 81.3 80.8  PLT 167 164 111* 0000000   Basic Metabolic Panel:  Recent Labs Lab 04/16/16 1141 04/17/16 0240 04/17/16 0856 04/18/16 0711 04/19/16 0407  NA 142 144 141 136 137  K 4.4 5.0 4.3 4.0 4.0  CL 110 111 110 106 106    CO2 23 24 23 22 23   GLUCOSE 179* 175* 144* 146* 110*  BUN 32* 35* 31* 22* 22*  CREATININE 2.35* 2.24* 2.12* 1.83* 2.10*  CALCIUM 8.9 9.1 8.5* 8.3* 8.3*   GFR: Estimated Creatinine Clearance: 27.5 mL/min (by C-G formula based on SCr of 2.1 mg/dL (H)). Liver Function Tests:  Recent Labs Lab 04/17/16 0856  AST 12*  ALT 12*  ALKPHOS 76  BILITOT 0.8  PROT 6.7  ALBUMIN 2.8*   No results for input(s): LIPASE, AMYLASE in the last 168 hours. No results for input(s): AMMONIA in the last 168 hours. Coagulation Profile:  Recent Labs Lab 04/17/16 0659  INR 1.26   Cardiac Enzymes: No results for input(s): CKTOTAL, CKMB, CKMBINDEX, TROPONINI in the last 168 hours. BNP (last 3 results) No results for input(s): PROBNP in the last 8760 hours. HbA1C:  Recent Labs  04/17/16 0856  HGBA1C 6.4*   CBG:  Recent Labs Lab 04/18/16 0729 04/18/16 1157 04/18/16 1624 04/18/16 2100 04/19/16 0738  GLUCAP 137* 137* 116* 114* 144*   Lipid Profile: No results for input(s): CHOL, HDL, LDLCALC, TRIG, CHOLHDL, LDLDIRECT in the last 72 hours. Thyroid Function Tests: No results for input(s): TSH, T4TOTAL, FREET4, T3FREE, THYROIDAB in the last 72 hours. Anemia Panel: No results for input(s): VITAMINB12, FOLATE, FERRITIN, TIBC, IRON, RETICCTPCT in the last 72 hours. Urine analysis:    Component Value Date/Time   COLORURINE RED (A) 04/17/2016 0329   APPEARANCEUR CLOUDY (A) 04/17/2016 0329   LABSPEC 1.010 04/17/2016 0329   PHURINE 5.0 04/17/2016 0329   GLUCOSEU NEGATIVE 04/17/2016 0329   GLUCOSEU 100 (A) 05/29/2006 0331   HGBUR LARGE (A) 04/17/2016 0329   BILIRUBINUR NEGATIVE 04/17/2016 0329   BILIRUBINUR neg 02/14/2016 0918   KETONESUR TRACE (A) 04/17/2016 0329   PROTEINUR 100 (A) 04/17/2016 0329   UROBILINOGEN 2.0 02/14/2016 0918   UROBILINOGEN 1.0 12/17/2008 1134   NITRITE POSITIVE (A) 04/17/2016 0329   LEUKOCYTESUR MODERATE (A) 04/17/2016 0329    Recent Results (from the past  240 hour(s))  Urine culture     Status: None   Collection Time: 04/16/16 11:41 AM  Result Value Ref Range Status   Specimen Description URINE, RANDOM  Final   Special Requests NONE  Final   Culture NO GROWTH Performed at [facility]   Final   Report Status 04/18/2016 FINAL  Final  Urine culture     Status: Abnormal   Collection Time: 04/17/16  3:29 AM  Result Value Ref Range Status   Specimen Description URINE, CATHETERIZED  Final   Special Requests NONE  Final   Culture (A)  Final    <10,000 COLONIES/mL INSIGNIFICANT GROWTH Performed at [facility]    Report Status 04/18/2016 FINAL  Final     Radiology Studies: No results found.  Scheduled Meds: . diltiazem  180 mg Oral Daily  . donepezil  10 mg Oral QHS  . ferrous sulfate  325 mg Oral Q breakfast  . furosemide  20 mg Oral Daily  . insulin aspart  0-5 Units Subcutaneous QHS  . insulin aspart  0-9 Units Subcutaneous TID WC  . loratadine  10 mg Oral Daily  . losartan  25 mg Oral Daily  . pravastatin  40 mg Oral q1800   Continuous Infusions:   LOS: 2 days   Time spent: 23 minutes. Greater than 50% of this time was spent in direct contact with the patient coordinating care.  Irwin Brakeman, MD [facility] Pager [phone]  If 7PM-7AM, please contact night-coverage [email or web address] Password [email or web address] 04/19/2016, 9:55 AM

## 2016-04-20 LAB — GLUCOSE, CAPILLARY
GLUCOSE-CAPILLARY: 120 mg/dL — AB (ref 65–99)
GLUCOSE-CAPILLARY: 178 mg/dL — AB (ref 65–99)

## 2016-04-20 NOTE — Progress Notes (Signed)
  Subjective: Patient reports decreased suprapubic discomfort. Urine culture negative. Hematuria cleared and patient is off CBI.  Objective: Vital signs in last 24 hours: Temp:  [97.4 F (36.3 C)-98.1 F (36.7 C)] 97.4 F (36.3 C) (01/04 0600) Pulse Rate:  [78-97] 97 (01/04 0600) Resp:  [16-20] 16 (01/04 0600) BP: (128-157)/(73-84) 157/84 (01/04 0600) SpO2:  [98 %-100 %] 100 % (01/04 0600) Weight:  [83 kg (182 lb 15.7 oz)] 83 kg (182 lb 15.7 oz) (01/04 0600)  Intake/Output from previous day: 01/03 0701 - 01/04 0700 In: 1200 [P.O.:1200] Out: 1550 [Urine:1550] Intake/Output this shift: Total I/O In: 120 [P.O.:120] Out: 1550 [Urine:1550]  Physical Exam:  General:alert, cooperative and appears older than stated age GI: soft, non tender, normal bowel sounds, no palpable masses, no organomegaly, no inguinal hernia Male genitalia: not done Extremities: extremities normal, atraumatic, no cyanosis or edema  Lab Results:  Recent Labs  04/17/16 0856 04/19/16 0407  HGB 10.7* 10.4*  HCT 31.7* 30.3*   BMET  Recent Labs  04/18/16 0711 04/19/16 0407  NA 136 137  K 4.0 4.0  CL 106 106  CO2 22 23  GLUCOSE 146* 110*  BUN 22* 22*  CREATININE 1.83* 2.10*  CALCIUM 8.3* 8.3*    Recent Labs  04/17/16 0659  INR 1.26   No results for input(s): LABURIN in the last 72 hours. Results for orders placed or performed during the hospital encounter of 04/16/16  Urine culture     Status: Abnormal   Collection Time: 04/17/16  3:29 AM  Result Value Ref Range Status   Specimen Description URINE, CATHETERIZED  Final   Special Requests NONE  Final   Culture (A)  Final    <10,000 COLONIES/mL INSIGNIFICANT GROWTH Performed at [facility]    Report Status 04/18/2016 FINAL  Final    Studies/Results: No results found.  Assessment/Plan: 83yo with bladder tumor and gross hematuria  1. Gross hematuria: Resolved. Patient should continue foley catheter and followup with Dr.  Diona Fanti in 1 week to discuss voiding trial and management of his bladder tumor   LOS: 3 days   Nicolette Bang 04/20/2016, 6:40 AM

## 2016-04-20 NOTE — Discharge Instructions (Signed)
Foley Catheter Care, Adult °A Foley catheter is a soft, flexible tube. This tube is placed into your bladder to drain pee (urine). If you go home with this catheter in place, follow the instructions below. °TAKING CARE OF THE CATHETER °1. Wash your hands with soap and water. °2. Put soap and water on a clean washcloth. °¨ Clean the skin where the tube goes into your body. °§ Clean away from the tube site. °§ Never wipe toward the tube. °§ Clean the area using a circular motion. °¨ Remove all the soap. Pat the area dry with a clean towel. For males, reposition the skin that covers the end of the penis (foreskin). °3. Attach the tube to your leg with tape or a leg strap. Do not stretch the tube tight. If you are using tape, remove any stickiness left behind by past tape you used. °4. Keep the drainage bag below your hips. Keep it off the floor. °5. Check your tube during the day. Make sure it is working and draining. Make sure the tube does not curl, twist, or bend. °6. Do not pull on the tube or try to take it out. °TAKING CARE OF THE DRAINAGE BAGS °You will have a large overnight drainage bag and a small leg bag. You may wear the overnight bag any time. Never wear the small bag at night. Follow the directions below. °Emptying the Drainage Bag  °Empty your drainage bag when it is ?-½ full or at least 2-3 times a day. °1. Wash your hands with soap and water. °2. Keep the drainage bag below your hips. °3. Hold the dirty bag over the toilet or clean container. °4. Open the pour spout at the bottom of the bag. Empty the pee into the toilet or container. Do not let the pour spout touch anything. °5. Clean the pour spout with a gauze pad or cotton ball that has rubbing alcohol on it. °6. Close the pour spout. °7. Attach the bag to your leg with tape or a leg strap. °8. Wash your hands well. °Changing the Drainage Bag  °Change your bag once a month or sooner if it starts to smell or look dirty.  °1. Wash your hands with  soap and water. °2. Pinch the rubber tube so that pee does not spill out. °3. Disconnect the catheter tube from the drainage tube at the connection valve. Do not let the tubes touch anything. °4. Clean the end of the catheter tube with an alcohol wipe. Clean the end of a the drainage tube with a different alcohol wipe. °5. Connect the catheter tube to the drainage tube of the clean drainage bag. °6. Attach the new bag to the leg with tape or a leg strap. Avoid attaching the new bag too tightly. °7. Wash your hands well. °Cleaning the Drainage Bag  °1. Wash your hands with soap and water. °2. Wash the bag in warm, soapy water. °3. Rinse the bag with warm water. °4. Fill the bag with a mixture of white vinegar and water (1 cup vinegar to 1 quart warm water [.2 liter vinegar to 1 liter warm water]). Close the bag and soak it for 30 minutes in the solution. °5. Rinse the bag with warm water. °6. Hang the bag to dry with the pour spout open and hanging downward. °7. Store the clean bag (once it is dry) in a clean plastic bag. °8. Wash your hands well. °PREVENT INFECTION °· Wash your hands before and after touching   your tube.  Take showers every day. Wash the skin where the tube enters your body. Do not take baths. Replace wet leg straps with dry ones, if this applies.  Do not use powders, sprays, or lotions on the genital area. Only use creams, lotions, or ointments as told by your doctor.  For females, wipe from front to back after going to the bathroom.  Drink enough fluids to keep your pee clear or pale yellow unless you are told not to have too much fluid (fluid restriction).  Do not let the drainage bag or tubing touch or lie on the floor.  Wear cotton underwear to keep the area dry. GET HELP IF:  Your pee is cloudy or smells unusually bad.  Your tube becomes clogged.  You are not draining pee into the bag or your bladder feels full.  Your tube starts to leak. GET HELP RIGHT AWAY IF:  You  have pain, puffiness (swelling), redness, or yellowish-white fluid (pus) where the tube enters the body.  You have pain in the belly (abdomen), legs, lower back, or bladder.  You have a fever.  You see blood fill the tube, or your pee is pink or red.  You feel sick to your stomach (nauseous), throw up (vomit), or have chills.  Your tube gets pulled out. MAKE SURE YOU:   Understand these instructions.  Will watch your condition.  Will get help right away if you are not doing well or get worse. This information is not intended to replace advice given to you by your health care provider. Make sure you discuss any questions you have with your health care provider. Document Released: 07/29/2012 Document Revised: 04/24/2014 Document Reviewed: 03/20/2015 Elsevier Interactive Patient Education  2017 [employer]    Hematuria, Adult Hematuria is blood in your urine. It can be caused by a bladder infection, kidney infection, prostate infection, kidney stone, or cancer of your urinary tract. Infections can usually be treated with medicine, and a kidney stone usually will pass through your urine. If neither of these is the cause of your hematuria, further workup to find out the reason may be needed. It is very important that you tell your health care provider about any blood you see in your urine, even if the blood stops without treatment or happens without causing pain. Blood in your urine that happens and then stops and then happens again can be a symptom of a very serious condition. Also, pain is not a symptom in the initial stages of many urinary cancers. Follow these instructions at home:  Drink lots of fluid, 3-4 quarts a day. If you have been diagnosed with an infection, cranberry juice is especially recommended, in addition to large amounts of water.  Avoid caffeine, tea, and carbonated beverages because they tend to irritate the bladder.  Avoid alcohol because it may irritate the  prostate.  Take all medicines as directed by your health care provider.  If you were prescribed an antibiotic medicine, finish it all even if you start to feel better.  If you have been diagnosed with a kidney stone, follow your health care provider's instructions regarding straining your urine to catch the stone.  Empty your bladder often. Avoid holding urine for long periods of time.  After a bowel movement, women should cleanse front to back. Use each tissue only once.  Empty your bladder before and after sexual intercourse if you are a male. Contact a health care provider if:  You develop back pain.  You have a fever.  You have a feeling of sickness in your stomach (nausea) or vomiting.  Your symptoms are not better in 3 days. Return sooner if you are getting worse. Get help right away if:  You develop severe vomiting and are unable to keep the medicine down.  You develop severe back or abdominal pain despite taking your medicines.  You begin passing a large amount of blood or clots in your urine.  You feel extremely weak or faint, or you pass out. This information is not intended to replace advice given to you by your health care provider. Make sure you discuss any questions you have with your health care provider. Document Released: 04/03/2005 Document Revised: 09/09/2015 Document Reviewed: 12/02/2012 Elsevier Interactive Patient Education  2017 [employer].

## 2016-04-20 NOTE — Progress Notes (Signed)
Patient's family states understanding of discharge instructions 

## 2016-04-20 NOTE — Care Management Important Message (Signed)
Important Message  Patient Details  Name: Tyrone Hall MRN: [id number] Date of Birth: 1931/01/15   Medicare Important Message Given:  Yes    Darol Cush, Chauncey Reading, RN 04/20/2016, 11:37 AM

## 2016-04-20 NOTE — Discharge Summary (Signed)
Physician Discharge Summary  Tyrone Hall [id number] DOB: 07-22-1930 DOA: 04/16/2016  PCP: Tula Nakayama, MD  Admit date: 04/16/2016 Discharge date: 04/20/2016  Admitted From: Home  Disposition:  Home with [facility] services   Recommendations for Outpatient Follow-up:  1. Follow up with PCP in 1-2 weeks 2. Please follow up with urologist in 1 week  Discharge Condition: STABLE CODE STATUS: FULL    Brief/Interim Summary: Tyrone Hall  is a 83 y.o. male, w hx of pelvic mass dx about 2-3 weeks ago w recent cystoscopy, CKD stage3, anemia, CAD, CVA, carotid stenosis, dementia apparently presents due to pulling catheter out, and having bleeding. Denies fever, chills, flank pain, n/v, dysuria.    In ED, pt noted to have uti and leukocytosis (wbc 19.3), and mild anemia, and stable ckd.  Urology consulted by Ed, requested iv abx, and will see in am to assess hematuria.  Appreciate their input.   Bladder cancer with gross hematuria -Hematuria seems to have resolved now, Family agreeable to home with catheter in place.  -Urology follow up in 1 week Dr. Dorina Hoyer. The option that seems to be most agreeable at this point is to discharge him home, if he continues to have bleeding episodes may need to look at a palliative resection of his tumor per urology service.  -Urine culture is negative, Discontinued antibiotics.  Stage III chronic kidney disease -Creatinine is currently at baseline  Type 2 diabetes -Well-controlled.  CBG (last 3)   Recent Labs  04/19/16 1702 04/19/16 2124 04/20/16 0723  GLUCAP 116* 142* 120*   DVT prophylaxis: SCDs Code Status: Full code Family Communication: Ex-wife and daughter at bedside Disposition Plan: Home with home health services  Consultants:   Urology  Procedures:   Continuous bladder irrigation  Discharge Diagnoses:  Principal Problem:   Bladder cancer ([place]) Active Problems:   Diabetes mellitus with kidney disease ([place])   Anemia,  iron deficiency   Hematuria   UTI (urinary tract infection)  Discharge Instructions  Allergies as of 04/20/2016   No Known Allergies     Medication List    TAKE these medications   cetirizine 10 MG tablet Commonly known as:  ZYRTEC Take 10 mg by mouth daily.   diltiazem 180 MG 24 hr capsule Commonly known as:  CARDIZEM CD TAKE ONE CAPSULE BY MOUTH ONCE DAILY AT 2PM.   donepezil 10 MG tablet Commonly known as:  ARICEPT TAKE (1) TABLET BY MOUTH AT BEDTIME.   ferrous sulfate 325 (65 FE) MG tablet Take 325 mg by mouth daily with breakfast.   furosemide 20 MG tablet Commonly known as:  LASIX Take 1 tablet (20 mg total) by mouth daily.   losartan 25 MG tablet Commonly known as:  COZAAR Take 1 tablet (25 mg total) by mouth daily.   lovastatin 40 MG tablet Commonly known as:  MEVACOR TAKE (1) TABLET BY MOUTH AT BEDTIME.   Melatonin 1 MG Caps Take 1 capsule (1 mg total) by mouth at bedtime.      Follow-up Information    DAHLSTEDT, Lillette Boxer, MD Follow up in 1 week(s).   Specialty:  Urology Why:  Follow-up in 1 weeks for catheter change Contact information: [address] [place] [address] [phone]        Tula Nakayama, MD. Schedule an appointment as soon as possible for a visit in 2 week(s).   Specialty:  Family Medicine Why:  Hospital Follow Up  Contact information: [place]  [place] [address] [phone]          No Known Allergies  Procedures/Studies: Ct Abdomen Pelvis Wo Contrast  Result Date: 04/17/2016 CLINICAL DATA:  Intermittent hematuria for 2 months, recurrent hematuria yesterday. Recent diagnosis of bladder cyst by urologist. History of hypertension, hyperlipidemia. EXAM: CT ABDOMEN AND PELVIS WITHOUT CONTRAST TECHNIQUE: Multidetector CT imaging of the abdomen and pelvis was performed following the standard protocol without IV contrast. COMPARISON:  CT abdomen and pelvis October 20, 2004 FINDINGS: Moderately  motion degraded examination. LOWER CHEST: Mild respiratory motion. Mild fibrotic changes. Moderate cardiomegaly, severe included coronary artery calcifications. Aortic annulus calcifications. HEPATOBILIARY: Status post cholecystectomy.  Liver is normal. PANCREAS: Normal. SPLEEN: Normal. ADRENALS/URINARY TRACT: Kidneys are orthotopic, demonstrating normal size and morphology. Limited assessment for renal masses on this nonenhanced examination. Moderate LEFT hydroureteronephrosis the level of the distal ureter where a is 6.5 x 5 cm mass is present contiguous with the ureterovesicular junction and LEFT pelvic wall. Blood products and Foley catheter within the urinary bladder. 3.1 cm dense mass abutting the RIGHT urinary bladder with surrounding fat stranding. Normal adrenal glands. STOMACH/BOWEL: The stomach, small and large bowel are normal in course and caliber without inflammatory changes, sensitivity decreased by lack of enteric contrast. Normal appendix. VASCULAR/LYMPHATIC: Aortoiliac vessels are normal in course and caliber, severe calcific atherosclerosis. 2 cm mass RIGHT pelvis image 71/98. 3.5 cm RIGHT external iliac chain mass. REPRODUCTIVE: Prostate is enlarged. OTHER: No intraperitoneal free fluid or free air. MUSCULOSKELETAL: Non-acute. Bilateral gynecomastia. Moderate RIGHT and small LEFT fat containing inguinal hernias. IMPRESSION: Moderately motion degraded examination. 6.5 x 5 cm LEFT pelvic mass encasing the distal ureter, contiguous with the LEFT pelvic wall results in moderate LEFT obstructive uropathy. Differential diagnosis includes primary bladder cancer, transitional cell cancer, less likely lymphoma. Intravesicular hemorrhage with Foley catheter. Recommend cystoscopy. RIGHT pelvic masses most consistent with lymphadenopathy/metastasis. Severe atherosclerosis. Electronically Signed   By: Elon Alas M.D.   On: 04/17/2016 04:35   Dg Shoulder Right  Result Date: 03/22/2016 Clinical:   History of right shoulder injury X-rays were done of the right shoulder, two views There is healing fracture of greater tuberosity of the right shoulder and proximal humerus.  Alignment is good.  Callus is present.  Humeral head is within the glenoid. Impression:  Healing fracture right proximal humerus fracture. Electronically Signed Sanjuana Kava, MD 12/6/20179:53 AM     Subjective: Pt without compliants.  Pt eating and drinking much better today.  More alert and interactive today.  Vocalizing today.   Discharge Exam: Vitals:   04/19/16 2036 04/20/16 0600  BP: 137/73 (!) 157/84  Pulse: 84 97  Resp: 20 16  Temp: 98 F (36.7 C) 97.4 F (36.3 C)   Vitals:   04/19/16 1300 04/19/16 1941 04/19/16 2036 04/20/16 0600  BP: 128/74  137/73 (!) 157/84  Pulse: 78  84 97  Resp: 20  20 16   Temp: 98.1 F (36.7 C)  98 F (36.7 C) 97.4 F (36.3 C)  TempSrc: Oral  Oral Oral  SpO2: 100% 98% 100% 100%  Weight:    83 kg (182 lb 15.7 oz)  Height:       General: Pt is alert, awake, not in acute distress Cardiovascular: RRR, S1/S2 +, no rubs, no gallops Respiratory: CTA bilaterally, no wheezing, no rhonchi Abdominal: Soft, NT, ND, bowel sounds + GU: foley cath, amber urine seen. Extremities: no edema, no cyanosis  The results of significant diagnostics from this hospitalization (including imaging, microbiology, ancillary and  laboratory) are listed below for reference.     Microbiology: Recent Results (from the past 240 hour(s))  Urine culture     Status: None   Collection Time: 04/16/16 11:41 AM  Result Value Ref Range Status   Specimen Description URINE, RANDOM  Final   Special Requests NONE  Final   Culture NO GROWTH Performed at [facility]   Final   Report Status 04/18/2016 FINAL  Final  Urine culture     Status: Abnormal   Collection Time: 04/17/16  3:29 AM  Result Value Ref Range Status   Specimen Description URINE, CATHETERIZED  Final   Special Requests NONE  Final    Culture (A)  Final    <10,000 COLONIES/mL INSIGNIFICANT GROWTH Performed at [facility]    Report Status 04/18/2016 FINAL  Final     Labs: BNP (last 3 results) No results for input(s): BNP in the last 8760 hours. Basic Metabolic Panel:  Recent Labs Lab 04/16/16 1141 04/17/16 0240 04/17/16 0856 04/18/16 0711 04/19/16 0407  NA 142 144 141 136 137  K 4.4 5.0 4.3 4.0 4.0  CL 110 111 110 106 106  CO2 23 24 23 22 23   GLUCOSE 179* 175* 144* 146* 110*  BUN 32* 35* 31* 22* 22*  CREATININE 2.35* 2.24* 2.12* 1.83* 2.10*  CALCIUM 8.9 9.1 8.5* 8.3* 8.3*   Liver Function Tests:  Recent Labs Lab 04/17/16 0856  AST 12*  ALT 12*  ALKPHOS 76  BILITOT 0.8  PROT 6.7  ALBUMIN 2.8*   No results for input(s): LIPASE, AMYLASE in the last 168 hours. No results for input(s): AMMONIA in the last 168 hours. CBC:  Recent Labs Lab 04/16/16 1141 04/17/16 0240 04/17/16 0856 04/19/16 0407  WBC 12.8* 19.3* 16.0* 13.9*  NEUTROABS  --  15.2*  --   --   HGB 11.5* 11.3* 10.7* 10.4*  HCT 33.6* 33.3* 31.7* 30.3*  MCV 81.2 81.0 81.3 80.8  PLT 167 164 111* 160   Cardiac Enzymes: No results for input(s): CKTOTAL, CKMB, CKMBINDEX, TROPONINI in the last 168 hours. BNP: Invalid input(s): POCBNP CBG:  Recent Labs Lab 04/19/16 0738 04/19/16 1104 04/19/16 1702 04/19/16 2124 04/20/16 0723  GLUCAP 144* 159* 116* 142* 120*   D-Dimer No results for input(s): DDIMER in the last 72 hours. Hgb A1c No results for input(s): HGBA1C in the last 72 hours. Lipid Profile No results for input(s): CHOL, HDL, LDLCALC, TRIG, CHOLHDL, LDLDIRECT in the last 72 hours. Thyroid function studies No results for input(s): TSH, T4TOTAL, T3FREE, THYROIDAB in the last 72 hours.  Invalid input(s): FREET3 Anemia work up No results for input(s): VITAMINB12, FOLATE, FERRITIN, TIBC, IRON, RETICCTPCT in the last 72 hours. Urinalysis    Component Value Date/Time   COLORURINE RED (A) 04/17/2016 0329    APPEARANCEUR CLOUDY (A) 04/17/2016 0329   LABSPEC 1.010 04/17/2016 0329   PHURINE 5.0 04/17/2016 0329   GLUCOSEU NEGATIVE 04/17/2016 0329   GLUCOSEU 100 (A) 05/29/2006 0331   HGBUR LARGE (A) 04/17/2016 0329   BILIRUBINUR NEGATIVE 04/17/2016 0329   BILIRUBINUR neg 02/14/2016 0918   KETONESUR TRACE (A) 04/17/2016 0329   PROTEINUR 100 (A) 04/17/2016 0329   UROBILINOGEN 2.0 02/14/2016 0918   UROBILINOGEN 1.0 12/17/2008 1134   NITRITE POSITIVE (A) 04/17/2016 0329   LEUKOCYTESUR MODERATE (A) 04/17/2016 0329   Sepsis Labs Invalid input(s): PROCALCITONIN,  WBC,  LACTICIDVEN Microbiology Recent Results (from the past 240 hour(s))  Urine culture     Status: None  Collection Time: 04/16/16 11:41 AM  Result Value Ref Range Status   Specimen Description URINE, RANDOM  Final   Special Requests NONE  Final   Culture NO GROWTH Performed at [facility]   Final   Report Status 04/18/2016 FINAL  Final  Urine culture     Status: Abnormal   Collection Time: 04/17/16  3:29 AM  Result Value Ref Range Status   Specimen Description URINE, CATHETERIZED  Final   Special Requests NONE  Final   Culture (A)  Final    <10,000 COLONIES/mL INSIGNIFICANT GROWTH Performed at [facility]    Report Status 04/18/2016 FINAL  Final   Time coordinating discharge: 33 minutes  SIGNED:  Irwin Brakeman, MD  [facility] 04/20/2016, 10:09 AM Pager   If 7PM-7AM, please contact night-coverage [email or web address] Password [email or web address]

## 2016-04-21 DIAGNOSIS — C679 Malignant neoplasm of bladder, unspecified: Secondary | ICD-10-CM | POA: Diagnosis not present

## 2016-04-21 DIAGNOSIS — I6523 Occlusion and stenosis of bilateral carotid arteries: Secondary | ICD-10-CM | POA: Diagnosis not present

## 2016-04-21 DIAGNOSIS — I2510 Atherosclerotic heart disease of native coronary artery without angina pectoris: Secondary | ICD-10-CM | POA: Diagnosis not present

## 2016-04-21 DIAGNOSIS — N183 Chronic kidney disease, stage 3 (moderate): Secondary | ICD-10-CM | POA: Diagnosis not present

## 2016-04-21 DIAGNOSIS — D500 Iron deficiency anemia secondary to blood loss (chronic): Secondary | ICD-10-CM | POA: Diagnosis not present

## 2016-04-21 DIAGNOSIS — F0391 Unspecified dementia with behavioral disturbance: Secondary | ICD-10-CM | POA: Diagnosis not present

## 2016-04-21 DIAGNOSIS — E785 Hyperlipidemia, unspecified: Secondary | ICD-10-CM | POA: Diagnosis not present

## 2016-04-21 DIAGNOSIS — Z8673 Personal history of transient ischemic attack (TIA), and cerebral infarction without residual deficits: Secondary | ICD-10-CM | POA: Diagnosis not present

## 2016-04-21 DIAGNOSIS — I129 Hypertensive chronic kidney disease with stage 1 through stage 4 chronic kidney disease, or unspecified chronic kidney disease: Secondary | ICD-10-CM | POA: Diagnosis not present

## 2016-04-21 DIAGNOSIS — Z466 Encounter for fitting and adjustment of urinary device: Secondary | ICD-10-CM | POA: Diagnosis not present

## 2016-04-21 DIAGNOSIS — I4891 Unspecified atrial fibrillation: Secondary | ICD-10-CM | POA: Diagnosis not present

## 2016-04-21 DIAGNOSIS — E1122 Type 2 diabetes mellitus with diabetic chronic kidney disease: Secondary | ICD-10-CM | POA: Diagnosis not present

## 2016-04-24 DIAGNOSIS — C679 Malignant neoplasm of bladder, unspecified: Secondary | ICD-10-CM | POA: Diagnosis not present

## 2016-04-24 DIAGNOSIS — I129 Hypertensive chronic kidney disease with stage 1 through stage 4 chronic kidney disease, or unspecified chronic kidney disease: Secondary | ICD-10-CM | POA: Diagnosis not present

## 2016-04-24 DIAGNOSIS — F0391 Unspecified dementia with behavioral disturbance: Secondary | ICD-10-CM | POA: Diagnosis not present

## 2016-04-24 DIAGNOSIS — Z466 Encounter for fitting and adjustment of urinary device: Secondary | ICD-10-CM | POA: Diagnosis not present

## 2016-04-24 DIAGNOSIS — E1122 Type 2 diabetes mellitus with diabetic chronic kidney disease: Secondary | ICD-10-CM | POA: Diagnosis not present

## 2016-04-24 DIAGNOSIS — N183 Chronic kidney disease, stage 3 (moderate): Secondary | ICD-10-CM | POA: Diagnosis not present

## 2016-04-25 ENCOUNTER — Ambulatory Visit (INDEPENDENT_AMBULATORY_CARE_PROVIDER_SITE_OTHER): Payer: Medicare Other | Admitting: Urology

## 2016-04-25 ENCOUNTER — Ambulatory Visit (HOSPITAL_COMMUNITY): Payer: Medicare Other

## 2016-04-25 DIAGNOSIS — R310 Gross hematuria: Secondary | ICD-10-CM | POA: Diagnosis not present

## 2016-04-25 DIAGNOSIS — F0391 Unspecified dementia with behavioral disturbance: Secondary | ICD-10-CM | POA: Diagnosis not present

## 2016-04-25 DIAGNOSIS — N183 Chronic kidney disease, stage 3 (moderate): Secondary | ICD-10-CM | POA: Diagnosis not present

## 2016-04-25 DIAGNOSIS — Z466 Encounter for fitting and adjustment of urinary device: Secondary | ICD-10-CM | POA: Diagnosis not present

## 2016-04-25 DIAGNOSIS — I129 Hypertensive chronic kidney disease with stage 1 through stage 4 chronic kidney disease, or unspecified chronic kidney disease: Secondary | ICD-10-CM | POA: Diagnosis not present

## 2016-04-25 DIAGNOSIS — C678 Malignant neoplasm of overlapping sites of bladder: Secondary | ICD-10-CM

## 2016-04-25 DIAGNOSIS — C679 Malignant neoplasm of bladder, unspecified: Secondary | ICD-10-CM | POA: Diagnosis not present

## 2016-04-25 DIAGNOSIS — E1122 Type 2 diabetes mellitus with diabetic chronic kidney disease: Secondary | ICD-10-CM | POA: Diagnosis not present

## 2016-04-26 DIAGNOSIS — E1122 Type 2 diabetes mellitus with diabetic chronic kidney disease: Secondary | ICD-10-CM | POA: Diagnosis not present

## 2016-04-26 DIAGNOSIS — C679 Malignant neoplasm of bladder, unspecified: Secondary | ICD-10-CM | POA: Diagnosis not present

## 2016-04-26 DIAGNOSIS — I129 Hypertensive chronic kidney disease with stage 1 through stage 4 chronic kidney disease, or unspecified chronic kidney disease: Secondary | ICD-10-CM | POA: Diagnosis not present

## 2016-04-26 DIAGNOSIS — Z466 Encounter for fitting and adjustment of urinary device: Secondary | ICD-10-CM | POA: Diagnosis not present

## 2016-04-26 DIAGNOSIS — N183 Chronic kidney disease, stage 3 (moderate): Secondary | ICD-10-CM | POA: Diagnosis not present

## 2016-04-26 DIAGNOSIS — F0391 Unspecified dementia with behavioral disturbance: Secondary | ICD-10-CM | POA: Diagnosis not present

## 2016-04-27 DIAGNOSIS — E1122 Type 2 diabetes mellitus with diabetic chronic kidney disease: Secondary | ICD-10-CM | POA: Diagnosis not present

## 2016-04-27 DIAGNOSIS — Z466 Encounter for fitting and adjustment of urinary device: Secondary | ICD-10-CM | POA: Diagnosis not present

## 2016-04-27 DIAGNOSIS — I129 Hypertensive chronic kidney disease with stage 1 through stage 4 chronic kidney disease, or unspecified chronic kidney disease: Secondary | ICD-10-CM | POA: Diagnosis not present

## 2016-04-27 DIAGNOSIS — N183 Chronic kidney disease, stage 3 (moderate): Secondary | ICD-10-CM | POA: Diagnosis not present

## 2016-04-27 DIAGNOSIS — C679 Malignant neoplasm of bladder, unspecified: Secondary | ICD-10-CM | POA: Diagnosis not present

## 2016-04-27 DIAGNOSIS — F0391 Unspecified dementia with behavioral disturbance: Secondary | ICD-10-CM | POA: Diagnosis not present

## 2016-04-28 DIAGNOSIS — Z466 Encounter for fitting and adjustment of urinary device: Secondary | ICD-10-CM | POA: Diagnosis not present

## 2016-04-28 DIAGNOSIS — N183 Chronic kidney disease, stage 3 (moderate): Secondary | ICD-10-CM | POA: Diagnosis not present

## 2016-04-28 DIAGNOSIS — I129 Hypertensive chronic kidney disease with stage 1 through stage 4 chronic kidney disease, or unspecified chronic kidney disease: Secondary | ICD-10-CM | POA: Diagnosis not present

## 2016-04-28 DIAGNOSIS — C679 Malignant neoplasm of bladder, unspecified: Secondary | ICD-10-CM | POA: Diagnosis not present

## 2016-04-28 DIAGNOSIS — F0391 Unspecified dementia with behavioral disturbance: Secondary | ICD-10-CM | POA: Diagnosis not present

## 2016-04-28 DIAGNOSIS — E1122 Type 2 diabetes mellitus with diabetic chronic kidney disease: Secondary | ICD-10-CM | POA: Diagnosis not present

## 2016-05-01 DIAGNOSIS — E1122 Type 2 diabetes mellitus with diabetic chronic kidney disease: Secondary | ICD-10-CM | POA: Diagnosis not present

## 2016-05-01 DIAGNOSIS — I129 Hypertensive chronic kidney disease with stage 1 through stage 4 chronic kidney disease, or unspecified chronic kidney disease: Secondary | ICD-10-CM | POA: Diagnosis not present

## 2016-05-01 DIAGNOSIS — N183 Chronic kidney disease, stage 3 (moderate): Secondary | ICD-10-CM | POA: Diagnosis not present

## 2016-05-01 DIAGNOSIS — Z466 Encounter for fitting and adjustment of urinary device: Secondary | ICD-10-CM | POA: Diagnosis not present

## 2016-05-01 DIAGNOSIS — F0391 Unspecified dementia with behavioral disturbance: Secondary | ICD-10-CM | POA: Diagnosis not present

## 2016-05-01 DIAGNOSIS — C679 Malignant neoplasm of bladder, unspecified: Secondary | ICD-10-CM | POA: Diagnosis not present

## 2016-05-02 DIAGNOSIS — Z466 Encounter for fitting and adjustment of urinary device: Secondary | ICD-10-CM | POA: Diagnosis not present

## 2016-05-02 DIAGNOSIS — F0391 Unspecified dementia with behavioral disturbance: Secondary | ICD-10-CM | POA: Diagnosis not present

## 2016-05-02 DIAGNOSIS — C679 Malignant neoplasm of bladder, unspecified: Secondary | ICD-10-CM | POA: Diagnosis not present

## 2016-05-02 DIAGNOSIS — E1122 Type 2 diabetes mellitus with diabetic chronic kidney disease: Secondary | ICD-10-CM | POA: Diagnosis not present

## 2016-05-02 DIAGNOSIS — I129 Hypertensive chronic kidney disease with stage 1 through stage 4 chronic kidney disease, or unspecified chronic kidney disease: Secondary | ICD-10-CM | POA: Diagnosis not present

## 2016-05-02 DIAGNOSIS — N183 Chronic kidney disease, stage 3 (moderate): Secondary | ICD-10-CM | POA: Diagnosis not present

## 2016-05-04 ENCOUNTER — Ambulatory Visit: Payer: Medicare Other | Admitting: Family Medicine

## 2016-05-04 DIAGNOSIS — F0391 Unspecified dementia with behavioral disturbance: Secondary | ICD-10-CM | POA: Diagnosis not present

## 2016-05-04 DIAGNOSIS — I129 Hypertensive chronic kidney disease with stage 1 through stage 4 chronic kidney disease, or unspecified chronic kidney disease: Secondary | ICD-10-CM | POA: Diagnosis not present

## 2016-05-04 DIAGNOSIS — N183 Chronic kidney disease, stage 3 (moderate): Secondary | ICD-10-CM | POA: Diagnosis not present

## 2016-05-04 DIAGNOSIS — C679 Malignant neoplasm of bladder, unspecified: Secondary | ICD-10-CM | POA: Diagnosis not present

## 2016-05-04 DIAGNOSIS — Z466 Encounter for fitting and adjustment of urinary device: Secondary | ICD-10-CM | POA: Diagnosis not present

## 2016-05-04 DIAGNOSIS — E1122 Type 2 diabetes mellitus with diabetic chronic kidney disease: Secondary | ICD-10-CM | POA: Diagnosis not present

## 2016-05-05 ENCOUNTER — Telehealth: Payer: Self-pay

## 2016-05-05 DIAGNOSIS — N183 Chronic kidney disease, stage 3 (moderate): Secondary | ICD-10-CM | POA: Diagnosis not present

## 2016-05-05 DIAGNOSIS — Z466 Encounter for fitting and adjustment of urinary device: Secondary | ICD-10-CM | POA: Diagnosis not present

## 2016-05-05 DIAGNOSIS — C679 Malignant neoplasm of bladder, unspecified: Secondary | ICD-10-CM | POA: Diagnosis not present

## 2016-05-05 DIAGNOSIS — I129 Hypertensive chronic kidney disease with stage 1 through stage 4 chronic kidney disease, or unspecified chronic kidney disease: Secondary | ICD-10-CM | POA: Diagnosis not present

## 2016-05-05 DIAGNOSIS — E1122 Type 2 diabetes mellitus with diabetic chronic kidney disease: Secondary | ICD-10-CM | POA: Diagnosis not present

## 2016-05-05 DIAGNOSIS — F0391 Unspecified dementia with behavioral disturbance: Secondary | ICD-10-CM | POA: Diagnosis not present

## 2016-05-05 NOTE — Telephone Encounter (Signed)
Attempted to reach daughter.  No answer.  Unable to leave message.

## 2016-05-05 NOTE — Telephone Encounter (Signed)
Attempt made to contact daughter who had left messages , infortunately , her mailbox is full and she did not answer. Pleas e get me to phone if she calls back , and if I am out of office  , I am on call, this weekend , she can page me. I am concerned about her father and want to provide support to his daughter/ family

## 2016-05-06 ENCOUNTER — Telehealth: Payer: Self-pay | Admitting: Family Medicine

## 2016-05-06 NOTE — Telephone Encounter (Signed)
Pt called in requesting to discuss her father's ongoing care as his health deteriorates. After some discussion, it is clear that she wishes comfort care only but needs as much help as she is able to get to actually carry this out. Please give her an appt for 01/26 at 8:15 am reason is a consult re Raine Alwardt , so pt name will be Tyrone Hall though his children will be the ones who actually come to the visit, thanks ?? pls ask

## 2016-05-08 NOTE — Telephone Encounter (Signed)
Hipolito Bayley Gordons daughter and his son are coming in Friday Jan 26th at 8:20 to discuss care

## 2016-05-09 ENCOUNTER — Ambulatory Visit (INDEPENDENT_AMBULATORY_CARE_PROVIDER_SITE_OTHER): Payer: Medicare Other | Admitting: Urology

## 2016-05-09 DIAGNOSIS — C770 Secondary and unspecified malignant neoplasm of lymph nodes of head, face and neck: Secondary | ICD-10-CM

## 2016-05-12 ENCOUNTER — Ambulatory Visit (INDEPENDENT_AMBULATORY_CARE_PROVIDER_SITE_OTHER): Payer: Medicare Other | Admitting: Family Medicine

## 2016-05-12 DIAGNOSIS — I129 Hypertensive chronic kidney disease with stage 1 through stage 4 chronic kidney disease, or unspecified chronic kidney disease: Secondary | ICD-10-CM | POA: Diagnosis not present

## 2016-05-12 DIAGNOSIS — Z466 Encounter for fitting and adjustment of urinary device: Secondary | ICD-10-CM | POA: Diagnosis not present

## 2016-05-12 DIAGNOSIS — F0391 Unspecified dementia with behavioral disturbance: Secondary | ICD-10-CM | POA: Diagnosis not present

## 2016-05-12 DIAGNOSIS — N183 Chronic kidney disease, stage 3 (moderate): Secondary | ICD-10-CM | POA: Diagnosis not present

## 2016-05-12 DIAGNOSIS — Z719 Counseling, unspecified: Secondary | ICD-10-CM | POA: Diagnosis not present

## 2016-05-12 DIAGNOSIS — E1122 Type 2 diabetes mellitus with diabetic chronic kidney disease: Secondary | ICD-10-CM | POA: Diagnosis not present

## 2016-05-12 DIAGNOSIS — C679 Malignant neoplasm of bladder, unspecified: Secondary | ICD-10-CM | POA: Diagnosis not present

## 2016-05-12 NOTE — Progress Notes (Signed)
Directly met with patient's 2 children  Patient has metastatic bladder cancer, and dementia,he is requiring 24 hour care, and is totally incontinent. Both agree and understand that offering cure is not an option in his interes, but comfort care is the desired goal. Hospice referral with in home placement desired, currently no bed available will still start the process  Reviewed medications currently taking , many can safely be discontinued in my opinion and I discussed this also with the children. All questions were answered

## 2016-05-14 ENCOUNTER — Encounter: Payer: Self-pay | Admitting: Family Medicine

## 2016-05-14 DIAGNOSIS — Z719 Counseling, unspecified: Secondary | ICD-10-CM | POA: Insufficient documentation

## 2016-05-14 NOTE — Assessment & Plan Note (Signed)
Patient's 2 children,Pendra and Terry in for consultation re patient's health, new diagnosis of metastatic bladder cancer in setting of severe dementia. All questions answered. Palliative/ comfort care is the goal. Referral to hospice urgently, caregiver prefers he be admitted into the home, unfortunately currently no beds available, but hospice will start services at the family home

## 2016-05-15 ENCOUNTER — Other Ambulatory Visit: Payer: Self-pay | Admitting: Family Medicine

## 2016-05-16 DIAGNOSIS — I10 Essential (primary) hypertension: Secondary | ICD-10-CM | POA: Diagnosis not present

## 2016-05-16 DIAGNOSIS — E118 Type 2 diabetes mellitus with unspecified complications: Secondary | ICD-10-CM | POA: Diagnosis not present

## 2016-05-16 DIAGNOSIS — H409 Unspecified glaucoma: Secondary | ICD-10-CM | POA: Diagnosis not present

## 2016-05-16 DIAGNOSIS — R531 Weakness: Secondary | ICD-10-CM | POA: Diagnosis not present

## 2016-05-16 DIAGNOSIS — F0150 Vascular dementia without behavioral disturbance: Secondary | ICD-10-CM | POA: Diagnosis not present

## 2016-05-16 DIAGNOSIS — N183 Chronic kidney disease, stage 3 (moderate): Secondary | ICD-10-CM | POA: Diagnosis not present

## 2016-05-16 DIAGNOSIS — C679 Malignant neoplasm of bladder, unspecified: Secondary | ICD-10-CM | POA: Diagnosis not present

## 2016-05-16 DIAGNOSIS — I519 Heart disease, unspecified: Secondary | ICD-10-CM | POA: Diagnosis not present

## 2016-05-17 DIAGNOSIS — I10 Essential (primary) hypertension: Secondary | ICD-10-CM | POA: Diagnosis not present

## 2016-05-17 DIAGNOSIS — F0150 Vascular dementia without behavioral disturbance: Secondary | ICD-10-CM | POA: Diagnosis not present

## 2016-05-17 DIAGNOSIS — N183 Chronic kidney disease, stage 3 (moderate): Secondary | ICD-10-CM | POA: Diagnosis not present

## 2016-05-17 DIAGNOSIS — C679 Malignant neoplasm of bladder, unspecified: Secondary | ICD-10-CM | POA: Diagnosis not present

## 2016-05-17 DIAGNOSIS — H409 Unspecified glaucoma: Secondary | ICD-10-CM | POA: Diagnosis not present

## 2016-05-17 DIAGNOSIS — I519 Heart disease, unspecified: Secondary | ICD-10-CM | POA: Diagnosis not present

## 2016-05-18 ENCOUNTER — Ambulatory Visit: Payer: Medicare Other

## 2016-05-18 DIAGNOSIS — H409 Unspecified glaucoma: Secondary | ICD-10-CM | POA: Diagnosis not present

## 2016-05-18 DIAGNOSIS — I10 Essential (primary) hypertension: Secondary | ICD-10-CM | POA: Diagnosis not present

## 2016-05-18 DIAGNOSIS — E118 Type 2 diabetes mellitus with unspecified complications: Secondary | ICD-10-CM | POA: Diagnosis not present

## 2016-05-18 DIAGNOSIS — I519 Heart disease, unspecified: Secondary | ICD-10-CM | POA: Diagnosis not present

## 2016-05-18 DIAGNOSIS — R531 Weakness: Secondary | ICD-10-CM | POA: Diagnosis not present

## 2016-05-18 DIAGNOSIS — C679 Malignant neoplasm of bladder, unspecified: Secondary | ICD-10-CM | POA: Diagnosis not present

## 2016-05-18 DIAGNOSIS — N183 Chronic kidney disease, stage 3 (moderate): Secondary | ICD-10-CM | POA: Diagnosis not present

## 2016-05-18 DIAGNOSIS — F0150 Vascular dementia without behavioral disturbance: Secondary | ICD-10-CM | POA: Diagnosis not present

## 2016-05-19 DIAGNOSIS — I519 Heart disease, unspecified: Secondary | ICD-10-CM | POA: Diagnosis not present

## 2016-05-19 DIAGNOSIS — I10 Essential (primary) hypertension: Secondary | ICD-10-CM | POA: Diagnosis not present

## 2016-05-19 DIAGNOSIS — H409 Unspecified glaucoma: Secondary | ICD-10-CM | POA: Diagnosis not present

## 2016-05-19 DIAGNOSIS — F0150 Vascular dementia without behavioral disturbance: Secondary | ICD-10-CM | POA: Diagnosis not present

## 2016-05-19 DIAGNOSIS — N183 Chronic kidney disease, stage 3 (moderate): Secondary | ICD-10-CM | POA: Diagnosis not present

## 2016-05-19 DIAGNOSIS — C679 Malignant neoplasm of bladder, unspecified: Secondary | ICD-10-CM | POA: Diagnosis not present

## 2016-05-20 DIAGNOSIS — H409 Unspecified glaucoma: Secondary | ICD-10-CM | POA: Diagnosis not present

## 2016-05-20 DIAGNOSIS — I10 Essential (primary) hypertension: Secondary | ICD-10-CM | POA: Diagnosis not present

## 2016-05-20 DIAGNOSIS — F0150 Vascular dementia without behavioral disturbance: Secondary | ICD-10-CM | POA: Diagnosis not present

## 2016-05-20 DIAGNOSIS — C679 Malignant neoplasm of bladder, unspecified: Secondary | ICD-10-CM | POA: Diagnosis not present

## 2016-05-20 DIAGNOSIS — N183 Chronic kidney disease, stage 3 (moderate): Secondary | ICD-10-CM | POA: Diagnosis not present

## 2016-05-20 DIAGNOSIS — I519 Heart disease, unspecified: Secondary | ICD-10-CM | POA: Diagnosis not present

## 2016-05-21 DIAGNOSIS — C679 Malignant neoplasm of bladder, unspecified: Secondary | ICD-10-CM | POA: Diagnosis not present

## 2016-05-21 DIAGNOSIS — F0150 Vascular dementia without behavioral disturbance: Secondary | ICD-10-CM | POA: Diagnosis not present

## 2016-05-21 DIAGNOSIS — N183 Chronic kidney disease, stage 3 (moderate): Secondary | ICD-10-CM | POA: Diagnosis not present

## 2016-05-21 DIAGNOSIS — I519 Heart disease, unspecified: Secondary | ICD-10-CM | POA: Diagnosis not present

## 2016-05-21 DIAGNOSIS — H409 Unspecified glaucoma: Secondary | ICD-10-CM | POA: Diagnosis not present

## 2016-05-21 DIAGNOSIS — I10 Essential (primary) hypertension: Secondary | ICD-10-CM | POA: Diagnosis not present

## 2016-05-24 ENCOUNTER — Telehealth: Payer: Self-pay | Admitting: Family Medicine

## 2016-05-26 NOTE — Telephone Encounter (Signed)
Appts deleted due to family's report of his passing 2016/06/21

## 2016-06-06 ENCOUNTER — Ambulatory Visit: Payer: Medicare Other | Admitting: Urology

## 2016-06-06 DIAGNOSIS — Z466 Encounter for fitting and adjustment of urinary device: Secondary | ICD-10-CM | POA: Diagnosis not present

## 2016-06-06 DIAGNOSIS — I129 Hypertensive chronic kidney disease with stage 1 through stage 4 chronic kidney disease, or unspecified chronic kidney disease: Secondary | ICD-10-CM | POA: Diagnosis not present

## 2016-06-06 DIAGNOSIS — C679 Malignant neoplasm of bladder, unspecified: Secondary | ICD-10-CM | POA: Diagnosis not present

## 2016-06-06 DIAGNOSIS — F0391 Unspecified dementia with behavioral disturbance: Secondary | ICD-10-CM | POA: Diagnosis not present

## 2016-06-15 ENCOUNTER — Telehealth: Payer: Self-pay | Admitting: Family Medicine

## 2016-06-15 NOTE — Telephone Encounter (Signed)
Pls call hospice and see if they can give a time of death for Tyrone Hall, this is NECESSARY for completion of the death certificate, which was delivered here yesterday, thanks

## 2016-06-15 DEATH — deceased

## 2016-06-16 NOTE — Telephone Encounter (Signed)
11:00pm

## 2016-06-28 ENCOUNTER — Ambulatory Visit: Payer: Medicare Other | Admitting: Family Medicine

## 2018-05-27 IMAGING — CT CT ABD-PELV W/O CM
2 of 4 series · 15 of 46 positions shown, 17 images · non-contrast
Comparison: CT abdomen and pelvis October 20, 2004

CLINICAL DATA: Intermittent hematuria for 2 months, recurrent
hematuria yesterday. Recent diagnosis of bladder cyst by urologist.
History of hypertension, hyperlipidemia.

EXAM:
CT ABDOMEN AND PELVIS WITHOUT CONTRAST
TECHNIQUE: Multidetector CT imaging of the abdomen and pelvis was performed
following the standard protocol without IV contrast.

[Series 2: axial st · axial · 0.75mm/px · z∈[+640,+1080]mm · 12 of 98 slices shown, 14 images]
[im 5/98  soft-tissue]
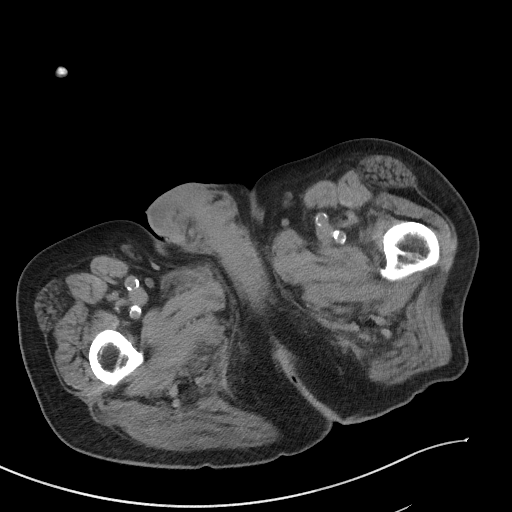
[im 5/98  bone]
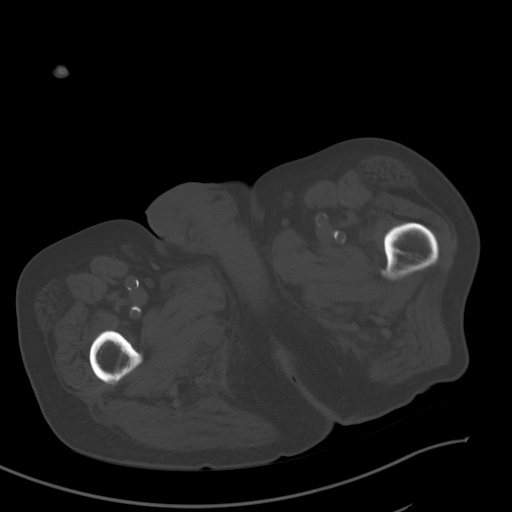
[im 14/98  soft-tissue]
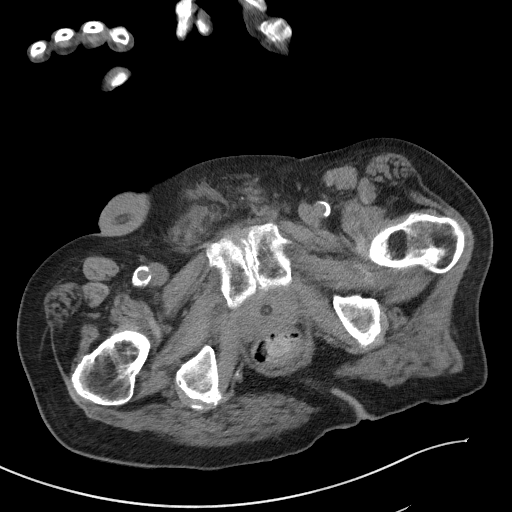
[im 23/98  soft-tissue]
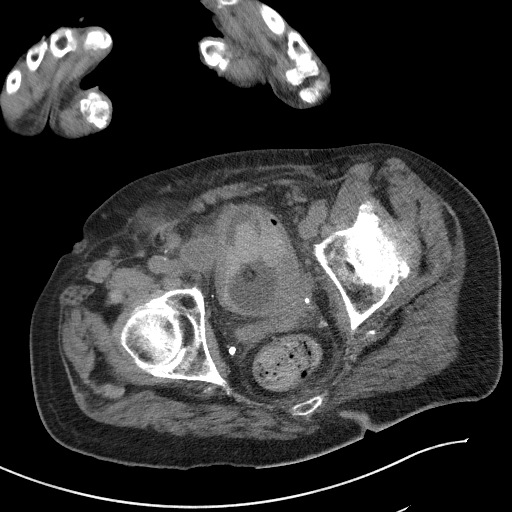
[im 31/98  soft-tissue]
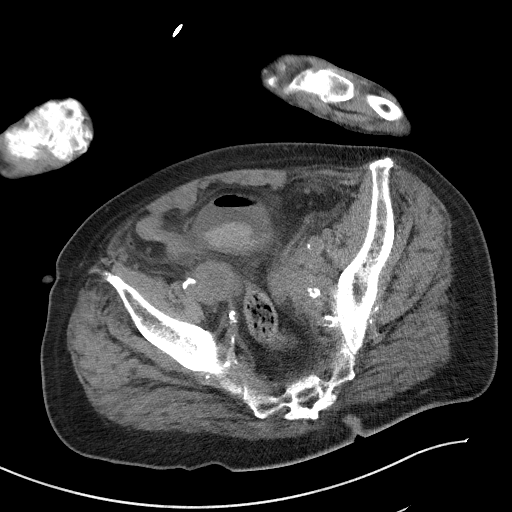
[im 36/98  soft-tissue]
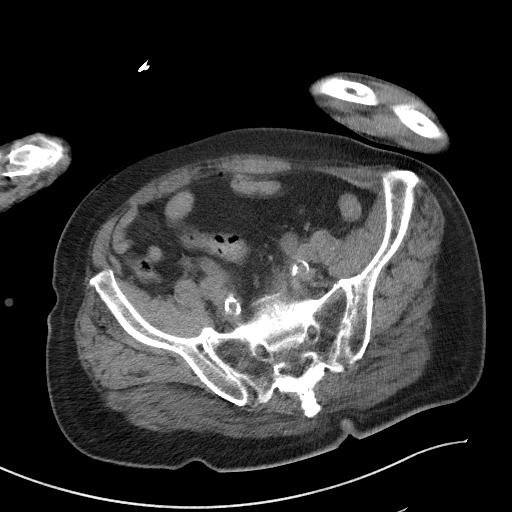
[im 45/98  soft-tissue]
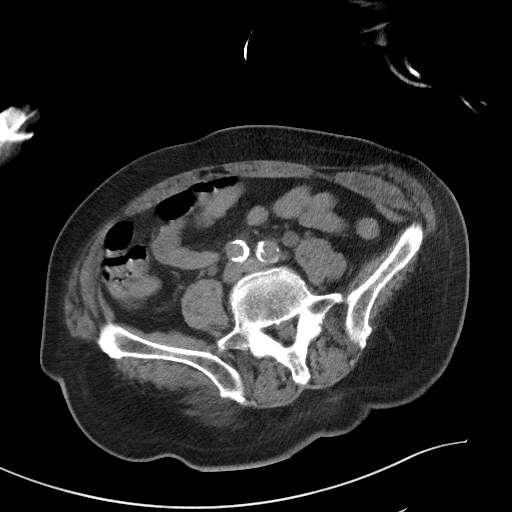
[im 53/98  soft-tissue]
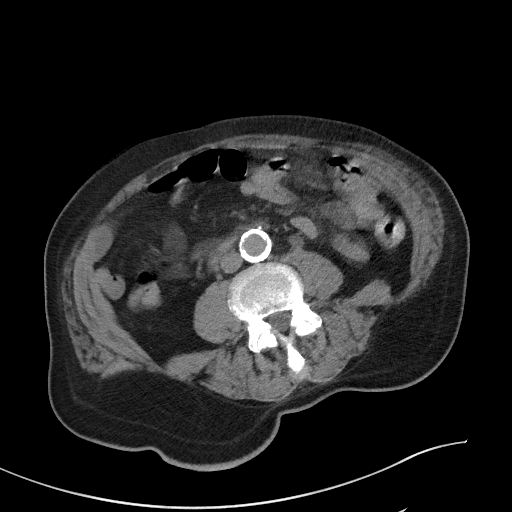
[im 62/98  soft-tissue]
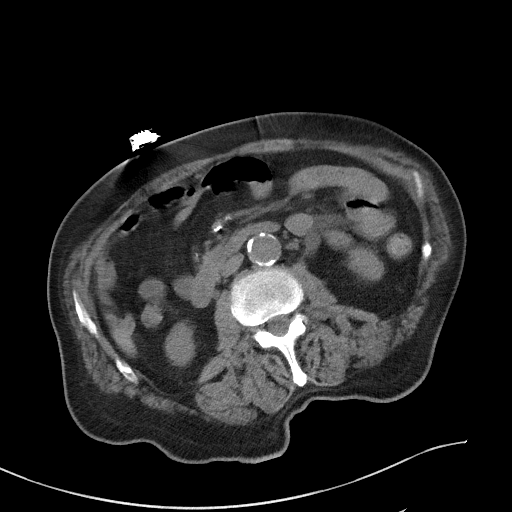
[im 67/98  soft-tissue]
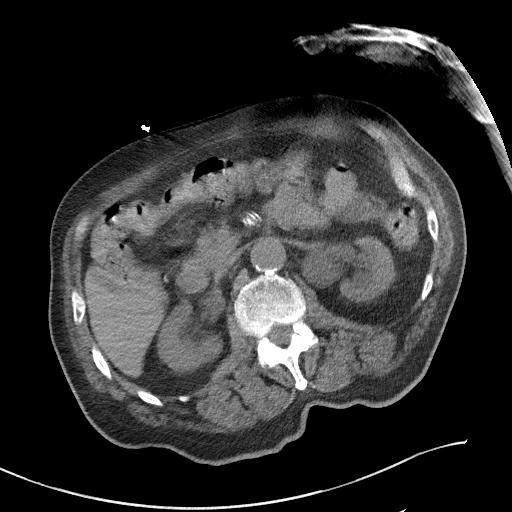
[im 67/98  bone]
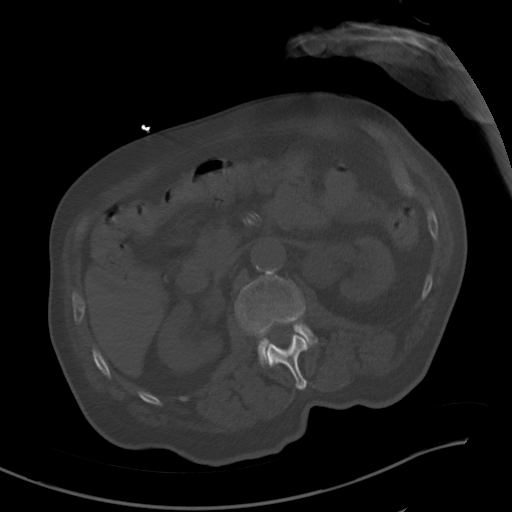
[im 75/98  soft-tissue]
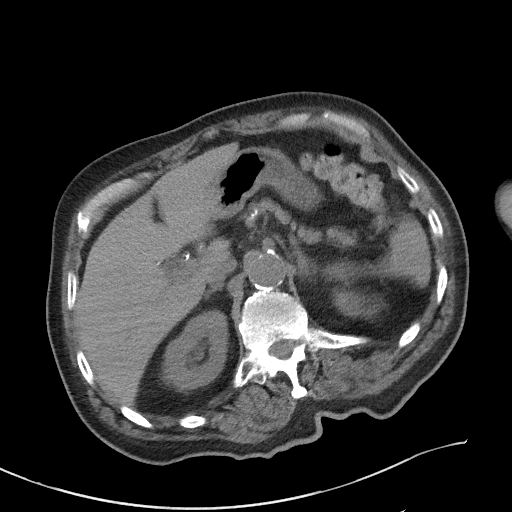
[im 84/98  soft-tissue]
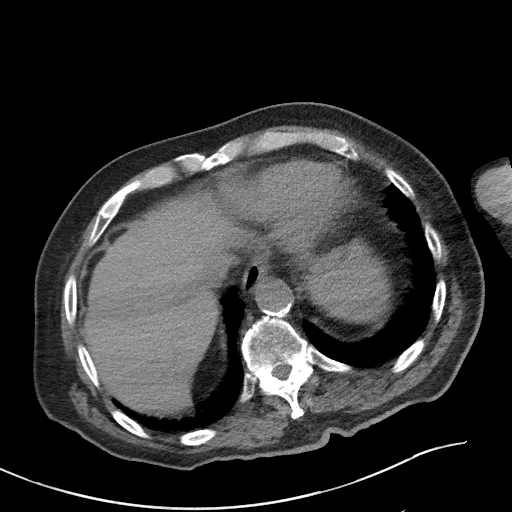
[im 93/98  soft-tissue]
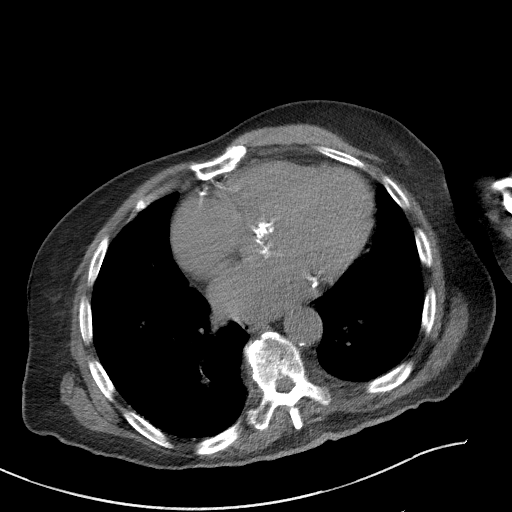

[Series 5: coronal st · coronal · 0.74mm/px · 3 of 101 slices shown]
[im 34/101  soft-tissue]
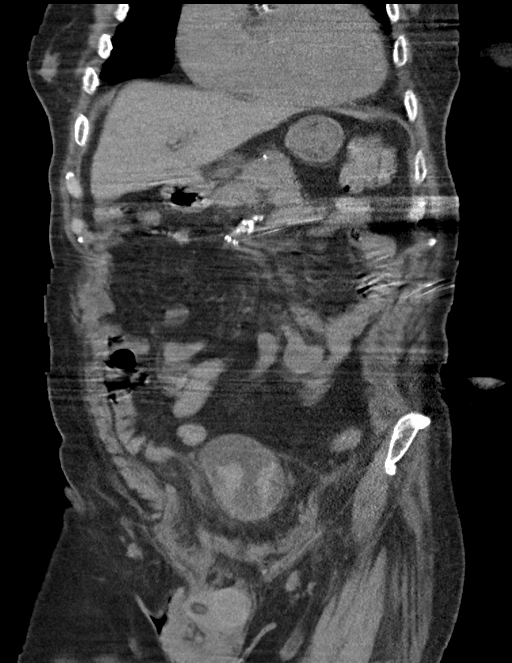
[im 45/101  soft-tissue]
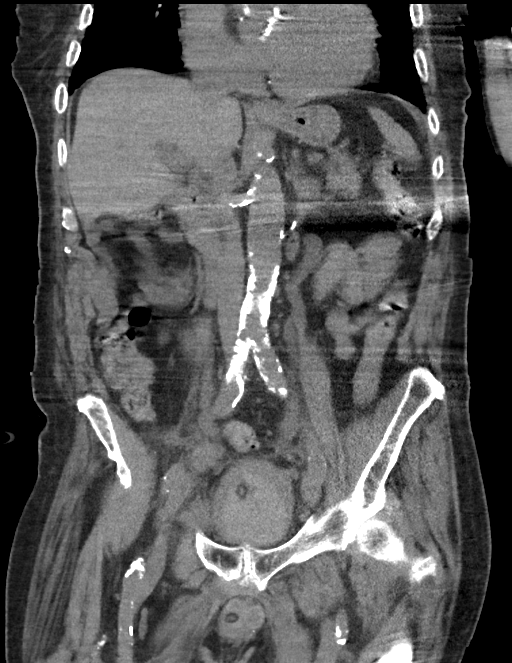
[im 56/101  soft-tissue]
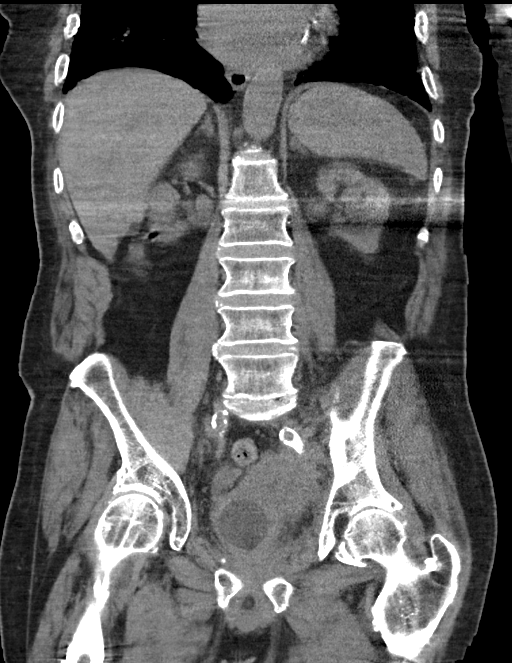

[15 of 46 positions shown; findings below may reference images not displayed]

FINDINGS: Moderately motion degraded examination.

LOWER CHEST: Mild respiratory motion. Mild fibrotic changes.
Moderate cardiomegaly, severe included coronary artery
calcifications. Aortic annulus calcifications.

HEPATOBILIARY: Status post cholecystectomy.  Liver is normal.

PANCREAS: Normal.

SPLEEN: Normal.

ADRENALS/URINARY TRACT: Kidneys are orthotopic, demonstrating normal
size and morphology. Limited assessment for renal masses on this
nonenhanced examination. Moderate LEFT hydroureteronephrosis the
level of the distal ureter where a is 6.5 x 5 cm mass is present
contiguous with the ureterovesicular junction and LEFT pelvic wall.
Blood products and Foley catheter within the urinary bladder. 3.1 cm
dense mass abutting the RIGHT urinary bladder with surrounding fat
stranding. Normal adrenal glands.

STOMACH/BOWEL: The stomach, small and large bowel are normal in
course and caliber without inflammatory changes, sensitivity
decreased by lack of enteric contrast. Normal appendix.

VASCULAR/LYMPHATIC: Aortoiliac vessels are normal in course and
caliber, severe calcific atherosclerosis. 2 cm mass RIGHT pelvis
image 71/98. 3.5 cm RIGHT external iliac chain mass.

REPRODUCTIVE: Prostate is enlarged.

OTHER: No intraperitoneal free fluid or free air.

MUSCULOSKELETAL: Non-acute. Bilateral gynecomastia. Moderate RIGHT
and small LEFT fat containing inguinal hernias.
IMPRESSION: Moderately motion degraded examination.

6.5 x 5 cm LEFT pelvic mass encasing the distal ureter, contiguous
with the LEFT pelvic wall results in moderate LEFT obstructive
uropathy. Differential diagnosis includes primary bladder cancer,
transitional cell cancer, less likely lymphoma. Intravesicular
hemorrhage with Foley catheter. Recommend cystoscopy.

RIGHT pelvic masses most consistent with lymphadenopathy/metastasis.

Severe atherosclerosis.
